# Patient Record
Sex: Female | Born: 1975
Health system: Southern US, Community
[De-identification: ages and names within clinical notes are randomized; demographics above are authoritative.]

## PROBLEM LIST (undated history)

## (undated) DIAGNOSIS — H269 Unspecified cataract: Secondary | ICD-10-CM

## (undated) DIAGNOSIS — M1990 Unspecified osteoarthritis, unspecified site: Secondary | ICD-10-CM

## (undated) DIAGNOSIS — Z5189 Encounter for other specified aftercare: Secondary | ICD-10-CM

## (undated) DIAGNOSIS — D649 Anemia, unspecified: Secondary | ICD-10-CM

## (undated) DIAGNOSIS — J45909 Unspecified asthma, uncomplicated: Secondary | ICD-10-CM

## (undated) DIAGNOSIS — R7303 Prediabetes: Secondary | ICD-10-CM

## (undated) DIAGNOSIS — E785 Hyperlipidemia, unspecified: Secondary | ICD-10-CM

## (undated) DIAGNOSIS — R0600 Dyspnea, unspecified: Secondary | ICD-10-CM

## (undated) DIAGNOSIS — Z973 Presence of spectacles and contact lenses: Secondary | ICD-10-CM

## (undated) DIAGNOSIS — Z8742 Personal history of other diseases of the female genital tract: Secondary | ICD-10-CM

## (undated) DIAGNOSIS — Z923 Personal history of irradiation: Secondary | ICD-10-CM

## (undated) DIAGNOSIS — J45990 Exercise induced bronchospasm: Secondary | ICD-10-CM

## (undated) DIAGNOSIS — T7840XA Allergy, unspecified, initial encounter: Secondary | ICD-10-CM

## (undated) HISTORY — DX: Hyperlipidemia, unspecified: E78.5

## (undated) HISTORY — DX: Allergy, unspecified, initial encounter: T78.40XA

## (undated) HISTORY — PX: MYOMECTOMY ABDOMINAL APPROACH: SUR870

## (undated) HISTORY — DX: Unspecified osteoarthritis, unspecified site: M19.90

## (undated) HISTORY — DX: Unspecified cataract: H26.9

## (undated) HISTORY — PX: OTHER SURGICAL HISTORY: SHX169

## (undated) HISTORY — DX: Encounter for other specified aftercare: Z51.89

## (undated) HISTORY — PX: WISDOM TOOTH EXTRACTION: SHX21

## (undated) MED FILL — Goserelin Acetate Implant 3.6 MG: SUBCUTANEOUS | Qty: 3.6 | Status: AC

---

## 2001-05-25 HISTORY — PX: RIGHT OOPHORECTOMY: SHX2359

## 2012-05-25 HISTORY — PX: MYOMECTOMY: SHX85

## 2012-05-25 HISTORY — PX: MYOMECTOMY ABDOMINAL APPROACH: SUR870

## 2014-08-02 ENCOUNTER — Telehealth: Payer: Self-pay | Admitting: Primary Care

## 2014-08-02 ENCOUNTER — Encounter: Payer: Self-pay | Admitting: Primary Care

## 2014-08-02 ENCOUNTER — Ambulatory Visit (INDEPENDENT_AMBULATORY_CARE_PROVIDER_SITE_OTHER): Payer: BLUE CROSS/BLUE SHIELD | Admitting: Primary Care

## 2014-08-02 VITALS — BP 118/76 | HR 79 | Temp 98.9°F | Ht 66.0 in | Wt 232.8 lb

## 2014-08-02 DIAGNOSIS — N6320 Unspecified lump in the left breast, unspecified quadrant: Secondary | ICD-10-CM

## 2014-08-02 DIAGNOSIS — R739 Hyperglycemia, unspecified: Secondary | ICD-10-CM

## 2014-08-02 DIAGNOSIS — Z720 Tobacco use: Secondary | ICD-10-CM | POA: Diagnosis not present

## 2014-08-02 DIAGNOSIS — N63 Unspecified lump in breast: Secondary | ICD-10-CM

## 2014-08-02 DIAGNOSIS — R635 Abnormal weight gain: Secondary | ICD-10-CM | POA: Diagnosis not present

## 2014-08-02 DIAGNOSIS — M2550 Pain in unspecified joint: Secondary | ICD-10-CM

## 2014-08-02 DIAGNOSIS — R7303 Prediabetes: Secondary | ICD-10-CM | POA: Insufficient documentation

## 2014-08-02 DIAGNOSIS — Z6841 Body Mass Index (BMI) 40.0 and over, adult: Secondary | ICD-10-CM

## 2014-08-02 LAB — BASIC METABOLIC PANEL
BUN: 14 mg/dL (ref 6–23)
CHLORIDE: 108 meq/L (ref 96–112)
CO2: 27 mEq/L (ref 19–32)
Calcium: 8.9 mg/dL (ref 8.4–10.5)
Creatinine, Ser: 0.81 mg/dL (ref 0.40–1.20)
GFR: 102.32 mL/min (ref >60.00)
Glucose, Bld: 92 mg/dL (ref 70–99)
POTASSIUM: 3.9 meq/L (ref 3.5–5.1)
Sodium: 138 mEq/L (ref 135–145)

## 2014-08-02 LAB — HEMOGLOBIN A1C: HEMOGLOBIN A1C: 5.9 % (ref 4.6–6.5)

## 2014-08-02 LAB — TSH: TSH: 0.87 u[IU]/mL (ref 0.35–4.50)

## 2014-08-02 NOTE — Telephone Encounter (Signed)
I forgot to mention that her Thyroid test was normal.  Thanks again!

## 2014-08-02 NOTE — Patient Instructions (Signed)
Complete lab work prior to leaving today, we will call you with your results. Continue to use ibuprofen as needed for joint pain, do not exceed 2400mg  daily. Use topical Capzasin as needed for joint pain. Please schedule a fasting physical in three months. Welcome to [facility]!

## 2014-08-02 NOTE — Assessment & Plan Note (Signed)
Suspect arthritis given family history and presentation. She uses ibuprofen or aleve for pain which helps to reduce inflammation as well. Continue to monitor. Offered to replace ibuprofen with Meloxicam, declines at this time.

## 2014-08-02 NOTE — Assessment & Plan Note (Signed)
Checks blood sugar at work and will get between 90-130 fasting. Significant weight gain over past 2 years. Will check A1C and BMET.

## 2014-08-02 NOTE — Telephone Encounter (Signed)
Please notify Ms. Sloss of her labs below.  1. A1C was 5.9 which is borderline for diabetes. Continue efforts on exercise and diet. We will recheck this in 3-6 months. 2. BMET: Kidney function and electrolytes look great.  We will do your fasting physical soon.  Thanks Vallarie Mare!!

## 2014-08-02 NOTE — Assessment & Plan Note (Signed)
Currently uses e-cigarette. Time spent discussing smoking cessation and the negative impacts it has on health. Offered to assist when she is ready to quit.

## 2014-08-02 NOTE — Progress Notes (Signed)
Pre visit review using our clinic review tool, if applicable. No additional management support is needed unless otherwise documented below in the visit note. 

## 2014-08-02 NOTE — Progress Notes (Signed)
Subjective:    Patient ID: Nichole [name], female    DOB: [birth year], 39 y.o.   MRN: [id number]  HPI  Nichole [name] is a 39 year old female who presents today to establish care. She moved here from [place] in September of 2015.  1) Borderline diabetes: She was once diagnosed by per prior PCP in [place] and was placed on Metformin. She was adherent to Metformin for one month prior to stopping due to GI side effects of nausea. She checks her blood sugar at work sporadically and will get readings from 90's to 130's fasting.   2) Obesity: BMI is 37.5. She reports an increase in weight of about 50 pounds within the past 2 years, 20 of those pounds since moving to [facility] in September. She has a busy schedule and finds little time to care for herself, and is working to cut down on her consumption of fast food.  She is currently not exercising.  3) Joint pain and swelling: She has chronic bilateral knee pain with an aching that is reduced with 800mg  iburpofen three times daily or three aleve daily. She also uses an OTC topical anti-inflammatory which helps to reduce inflammation. 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 The swelling will reduce/resolve once she elevates her legs. She does not wear compression stockings daily when working.  4) Tobacco abuse: Smoked cigarettes intermittently since age 79 with periods of cessation for 2-3 years at a time. When smoking she would smoke 5-6 cigs daily on her days off and 2 cigs when working. She switched to the vapor 5 months ago and is now currently using an e-cigarette. She has been on Chantix in the past with reports of nausea and vomiting. She is not ready to quite, but will consider.  5) Breast Mass: Mother had breast cancer with lumpectomy at age 30. Patient had mammogram 2 years ago for a mass that resulted as benign. She was to have a follow up mammogram last year but was not  able to do so. Requested old records to review mammogram report.   Review of Systems  Constitutional: Positive for unexpected weight change.       Put on 20 of the 50 pounds since September 2015  HENT: Negative for rhinorrhea.   Respiratory: Negative for shortness of breath.   Cardiovascular: Negative for chest pain.  Gastrointestinal: Negative for diarrhea and constipation.  Endocrine: Negative for cold intolerance, heat intolerance and polyuria.  Genitourinary: Negative for dysuria and frequency.  Musculoskeletal: Positive for arthralgias.       See HPI  Skin: Negative for rash.  Neurological: Negative for dizziness and headaches.  Hematological: Negative for adenopathy.  Psychiatric/Behavioral: Negative for sleep disturbance.       Denies anxiety/depression       Past Medical History  Diagnosis Date  . Diabetes mellitus without complication     borderline    History   Social History  . Marital Status: Single    Spouse Name: N/A  . Number of Children: N/A  . Years of Education: N/A   Occupational History  . Not on file.   Social History Main Topics  . Smoking status: Light Tobacco Smoker  . Smokeless tobacco: Not on file  . Alcohol Use: 0.0 oz/week    0 Standard drinks or equivalent per week     Comment: occ  . Drug Use: No  .  Sexual Activity: Not on file   Other Topics Concern  . 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   Had 87 year old son.   Moved from [place] in Sept. 2015       Past Surgical History  Procedure Laterality Date  . Abdominal hysterectomy  2003    partial oopphorectomy  . Myomectomy  2014    Family History  Problem Relation Age of Onset  . Arthritis Mother   . Breast cancer Mother 34    Lumpectomy  . Hyperlipidemia Mother   . Hypertension Mother   . Kidney cancer Mother 77    Had ablasion  . Graves' disease Mother     Had thyroid removed  .  Graves' disease Brother     Deceased    No Known Allergies  No current outpatient prescriptions on file prior to visit.   No current facility-administered medications on file prior to visit.    BP 118/76 mmHg  Pulse 79  Temp(Src) 98.9 F (37.2 C) (Oral)  Ht 5\' 6"  (1.676 m)  Wt 232 lb 12.8 oz (105.597 kg)  BMI 37.59 kg/m2  SpO2 96%  LMP 07/24/2014    Objective:   Physical Exam  Constitutional: She is oriented to person, place, and time. She appears well-developed.  Overweight  HENT:  Head: Normocephalic.  Right Ear: External ear normal.  Left Ear: External ear normal.  Mouth/Throat: Oropharynx is clear and moist.  Eyes: Conjunctivae are normal. Pupils are equal, round, and reactive to light.  Cardiovascular: Normal rate and regular rhythm.   Pulmonary/Chest: Effort normal and breath sounds normal.  Neurological: She is alert and oriented to person, place, and time.  Skin: Skin is warm and dry. No rash noted.  Psychiatric: She has a normal mood and affect.          Assessment & Plan:

## 2014-08-02 NOTE — Assessment & Plan Note (Signed)
History of breast mass 2 years ago. Mammogram reported it to be benign.  Requested old records which include prior Mammogram. Will likely need to repeat depending on recommendations from report.

## 2014-08-02 NOTE — Assessment & Plan Note (Signed)
Mostly unhealthy diet, attempting to cut out fast food, no exercise. Discussed healthy diet and exercise. 1600 calorie meal guide provided to patient. Will continue to monitor.

## 2014-08-03 ENCOUNTER — Telehealth: Payer: Self-pay | Admitting: Primary Care

## 2014-08-03 ENCOUNTER — Encounter: Payer: Self-pay | Admitting: *Deleted

## 2014-08-03 NOTE — Telephone Encounter (Signed)
Called and notified patient of Kate's comments. Patient verbalized understanding. A letter sent with lab results sent to patient.  Patient also asked is there anything you can prescribed for her to help with weight loss. Please advise.

## 2014-08-03 NOTE — Telephone Encounter (Signed)
Called and notified patient of Kate's comments. CPE is schedule for 09/03/14.

## 2014-08-03 NOTE — Telephone Encounter (Signed)
Please notify Ms. Rockhill that I am happy to discuss this at her physical, once scheduled. We will need to obtain some preliminary tests prior to initiation of this medication, so she will need to be seen.  Thank you! Allie Bossier

## 2014-08-03 NOTE — Telephone Encounter (Signed)
Called and notified patient of Kate's comments. Patient verbalized understanding.  

## 2014-08-03 NOTE — Telephone Encounter (Signed)
emmi mailed  °

## 2014-08-16 ENCOUNTER — Ambulatory Visit (INDEPENDENT_AMBULATORY_CARE_PROVIDER_SITE_OTHER): Payer: BLUE CROSS/BLUE SHIELD | Admitting: Primary Care

## 2014-08-16 ENCOUNTER — Encounter: Payer: Self-pay | Admitting: Primary Care

## 2014-08-16 VITALS — BP 126/72 | HR 74 | Temp 98.7°F | Ht 66.0 in | Wt 231.4 lb

## 2014-08-16 DIAGNOSIS — M5416 Radiculopathy, lumbar region: Secondary | ICD-10-CM | POA: Insufficient documentation

## 2014-08-16 MED ORDER — CYCLOBENZAPRINE HCL 5 MG PO TABS: 5.0000 mg | ORAL_TABLET | Freq: Two times a day (BID) | ORAL | Status: DC | PRN

## 2014-08-16 NOTE — Progress Notes (Signed)
Pre visit review using our clinic review tool, if applicable. No additional management support is needed unless otherwise documented below in the visit note. 

## 2014-08-16 NOTE — Assessment & Plan Note (Signed)
Present more to mid left buttocks, pain with left straight leg raise. Naproxen twice daily for pain, Flexeril PRN for severe pain. 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 Will give this 6 weeks of conservative therapy before considering imaging. This may be due in part to her recent weight gain. Will follow up in 2 weeks during her physical. Will consider PT if needed.

## 2014-08-16 NOTE — Progress Notes (Signed)
Subjective:    Patient ID: Nichole [name], female    DOB: 11/02/1975, 39 y.o.   MRN: [id number]  HPI  Nichole [name] is a 39 year old female who presents today with a chief complaint of left lower back pain that is located to her left mid buttocks. This pain has been dull and achy intermittently for the past several months but starting one week ago she experienced a "lightening bolt" pain that shot down through her left lower extremity to her toes. She repots feeling "pin pricks" to her toes intermittently throughout the day. She's experienced 2 episodes of this shooting pain twice in the past week. The shooting pain will occur when she's walking at work and has caused her to grasp onto a counter top. Her pain will decrease if she bends forward. She's taken inconsistent doses of motrin, aleve, and tylenol daily which have provided temproary relief. She's also recently gained weight over the past year and is working on her diet. She denies recent injury/trauma, bowel/bladder incontinence or retention, falls, weakness, or fevers.   Review of Systems  Constitutional: Negative for fever and chills.  Respiratory: Negative for shortness of breath.   Cardiovascular: Negative for chest pain.  Gastrointestinal: Negative for abdominal pain and blood in stool.  Genitourinary: Negative for urgency and frequency.  Musculoskeletal: Positive for back pain. Negative for gait problem.       Muscle tightness to left buttocks.  Neurological: Negative for dizziness, weakness and light-headedness.       Past Medical History  Diagnosis Date  . Diabetes mellitus without complication     borderline    History   Social History  . Marital Status: Single    Spouse Name: N/A  . Number of Children: N/A  . Years of Education: N/A   Occupational History  . Not on file.   Social History Main Topics  . Smoking status: Light Tobacco Smoker  . Smokeless tobacco: Not on file  . Alcohol Use: 0.0 oz/week    0  Standard drinks or equivalent per week     Comment: occ  . Drug Use: No  . Sexual Activity: Not on file   Other Topics Concern  . 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   Had 80 year old son.   Moved from [place] in Sept. 2015       Past Surgical History  Procedure Laterality Date  . Abdominal hysterectomy  2003    partial oopphorectomy  . Myomectomy  2014    Family History  Problem Relation Age of Onset  . Arthritis Mother   . Breast cancer Mother 54    Lumpectomy  . Hyperlipidemia Mother   . Hypertension Mother   . Kidney cancer Mother 28    Had ablasion  . Graves' disease Mother     Had thyroid removed  . Graves' disease Brother     Deceased    No Known Allergies  No current outpatient prescriptions on file prior to visit.   No current facility-administered medications on file prior to visit.    BP 126/72 mmHg  Pulse 74  Temp(Src) 98.7 F (37.1 C) (Oral)  Ht 5\' 6"  (1.676 m)  Wt 231 lb 6.4 oz (104.962 kg)  BMI 37.37 kg/m2  SpO2 97%  LMP 07/24/2014    Objective:   Physical Exam  Constitutional: She is oriented to  person, place, and time. She appears well-developed.  Eyes: EOM are normal. Pupils are equal, round, and reactive to light.  Cardiovascular: Normal rate, regular rhythm and intact distal pulses.   Pulmonary/Chest: Effort normal and breath sounds normal.  Musculoskeletal:       Lumbar back: She exhibits no deformity.  Pain is mostly located deeply within mid left buttocks tissue. Decreased ROM and pain to left lateral flexion and pain during left straight leg raise laying prone and sitting upright.  Neurological: She is alert and oriented to person, place, and time. She has normal reflexes. No cranial nerve deficit.  Skin: Skin is warm and dry.  Psychiatric: She has a normal mood and affect.          Assessment & Plan:

## 2014-08-16 NOTE — Patient Instructions (Addendum)
Take Naproxen 500mg  twice daily for pain. Use Flexeril as needed for severe muscle spasms/back pain. Rest, ice/heat.  We will follow up with this during your scheduled physical in 2 weeks.  Lumbosacral Radiculopathy Lumbosacral radiculopathy is a pinched nerve or nerves in the low back (lumbosacral area). When this happens you may have weakness in your legs and may not be able to stand on your toes. You may have pain going down into your legs. There may be difficulties with walking normally. There are many causes of this problem. Sometimes this may happen from an injury, or simply from arthritis or boney problems. It may also be caused by other illnesses such as diabetes. If there is no improvement after treatment, further studies may be done to find the exact cause. DIAGNOSIS  X-rays may be needed if the problems become long standing. Electromyograms may be done. This study is one in which the working of nerves and muscles is studied. HOME CARE INSTRUCTIONS   Applications of ice packs may be helpful. Ice can be used in a plastic bag with a towel around it to prevent frostbite to skin. This may be used every 2 hours for 20 to 30 minutes, or as needed, while awake, or as directed by your caregiver.  Only take over-the-counter or prescription medicines for pain, discomfort, or fever as directed by your caregiver.  If physical therapy was prescribed, follow your caregiver's directions. SEEK IMMEDIATE MEDICAL CARE IF:   You have pain not controlled with medications.  You seem to be getting worse rather than better.  You develop increasing weakness in your legs.  You develop loss of bowel or bladder control.  You have difficulty with walking or balance, or develop clumsiness in the use of your legs.  You have a fever. MAKE SURE YOU:   Understand these instructions.  Will watch your condition.  Will get help right away if you are not doing well or get worse. Document Released:  05/11/2005 Document Revised: 08/03/2011 Document Reviewed: 12/30/2007 [facility] Patient Information 2015 [place], [place]. This information is not intended to replace advice given to you by your health care provider. Make sure you discuss any questions you have with your health care provider.

## 2014-09-03 ENCOUNTER — Encounter: Payer: Self-pay | Admitting: Primary Care

## 2014-09-03 ENCOUNTER — Ambulatory Visit (INDEPENDENT_AMBULATORY_CARE_PROVIDER_SITE_OTHER): Payer: BLUE CROSS/BLUE SHIELD | Admitting: Primary Care

## 2014-09-03 VITALS — BP 126/78 | HR 77 | Temp 98.1°F | Ht 66.0 in | Wt 234.4 lb

## 2014-09-03 DIAGNOSIS — N920 Excessive and frequent menstruation with regular cycle: Secondary | ICD-10-CM | POA: Insufficient documentation

## 2014-09-03 DIAGNOSIS — N63 Unspecified lump in breast: Secondary | ICD-10-CM

## 2014-09-03 DIAGNOSIS — E669 Obesity, unspecified: Secondary | ICD-10-CM

## 2014-09-03 DIAGNOSIS — N939 Abnormal uterine and vaginal bleeding, unspecified: Secondary | ICD-10-CM | POA: Diagnosis not present

## 2014-09-03 DIAGNOSIS — Z9889 Other specified postprocedural states: Secondary | ICD-10-CM

## 2014-09-03 DIAGNOSIS — M5416 Radiculopathy, lumbar region: Secondary | ICD-10-CM | POA: Diagnosis not present

## 2014-09-03 DIAGNOSIS — N6320 Unspecified lump in the left breast, unspecified quadrant: Secondary | ICD-10-CM

## 2014-09-03 LAB — CBC WITH DIFFERENTIAL/PLATELET
BASOS PCT: 0.3 % (ref 0.0–3.0)
Basophils Absolute: 0 10*3/uL (ref 0.0–0.1)
EOS ABS: 0.1 10*3/uL (ref 0.0–0.7)
Eosinophils Relative: 1.8 % (ref 0.0–5.0)
HCT: 31.3 % — ABNORMAL LOW (ref 36.0–46.0)
Hemoglobin: 10.3 g/dL — ABNORMAL LOW (ref 12.0–15.0)
Lymphocytes Relative: 46.6 % — ABNORMAL HIGH (ref 12.0–46.0)
Lymphs Abs: 1.7 10*3/uL (ref 0.7–4.0)
MCHC: 32.8 g/dL (ref 30.0–36.0)
MCV: 88.7 fl (ref 78.0–100.0)
MONO ABS: 0.3 10*3/uL (ref 0.1–1.0)
Monocytes Relative: 7.4 % (ref 3.0–12.0)
Neutro Abs: 1.6 10*3/uL (ref 1.4–7.7)
Neutrophils Relative %: 43.9 % (ref 43.0–77.0)
Platelets: 229 10*3/uL (ref 150.0–400.0)
RBC: 3.53 Mil/uL — ABNORMAL LOW (ref 3.87–5.11)
RDW: 14.4 % (ref 11.5–15.5)
WBC: 3.6 10*3/uL — ABNORMAL LOW (ref 4.0–10.5)

## 2014-09-03 LAB — HEPATIC FUNCTION PANEL
ALBUMIN: 3.5 g/dL (ref 3.5–5.2)
ALT: 10 U/L (ref 0–35)
AST: 13 U/L (ref 0–37)
Alkaline Phosphatase: 63 U/L (ref 39–117)
Bilirubin, Direct: 0.1 mg/dL (ref 0.0–0.3)
Total Bilirubin: 0.2 mg/dL (ref 0.2–1.2)
Total Protein: 7 g/dL (ref 6.0–8.3)

## 2014-09-03 MED ORDER — PHENTERMINE HCL 15 MG PO CAPS: 15.0000 mg | ORAL_CAPSULE | ORAL | Status: DC

## 2014-09-03 NOTE — Assessment & Plan Note (Signed)
Old records recommended that she follow up in 2015 which she did not. Ordered bilateral diagnostic mammogram with ultrasound to left breast. Breast exam completed today and was unremarkable.

## 2014-09-03 NOTE — Assessment & Plan Note (Signed)
Heavy periods for years. History of Myomectomy in 2014 and continues to have bleeding since. It was recommended by GYN to have an abdominal hysterectomy for which she currently reguses. Also refuses to follow up with GYN and will "think about it". Will obtain CBC today to evaluate extent of bleeding.

## 2014-09-03 NOTE — Progress Notes (Signed)
Pre visit review using our clinic review tool, if applicable. No additional management support is needed unless otherwise documented below in the visit note. 

## 2014-09-03 NOTE — Assessment & Plan Note (Signed)
Continued weight gain despite efforts to exercise and improve diet over past month. Will start Phentermine per patient's request to assist with weight loss. Baseline ECG, blood work, and BP obtained which are all stable. She was educated on the side effects of this medication, and that she must incorporate a healthy diet and continued exercise to be successful with this medication. She verbalized understanding. She was also educated to use birth control while on this medication. Will follow up in 4 weeks.

## 2014-09-03 NOTE — Progress Notes (Signed)
Subjective:    Patient ID: Nichole [name], female    DOB: 14-Nov-1975, 39 y.o.   MRN: [id number]  HPI  Nichole [name] is a 39 year old female who presents today who presents today for complete physical.  Immunizations: -Tetanus: Last tetanus was 8 years ago. -Influenza: Had last season   Diet: She prepares home made meals, and is starting to scale down on the quantity of her meals. She will typically have soup and sandwich with fruit for lunch and or dinner, but will occasionally fix a full course meal that may include  baked ham, potato salad, cornbread, rice.   Exercise: Has been doing more outdoor activities with her daughter such as playing in the park and walking. She attempts to walk around her community for about 20 minutes, three-four days a week.  Pap Smear: Last pap was 2 years ago.   Mammogram: Had left breast mass 2 years ago that was determined to be benign. It was recommended at that time that she come back in 2015 for follow up which she did not do so.  1) Back pain: Feels better after using ibuprofen daily and flexeril as needed.   2) Obesity: She's been struggling with steady weight gain for years and would like to go on weight loss medications. She's tried dieting and exercising in the past without a reduction in weight. She was seen one month ago and was encouraged to work very hard on weight loss efforts. She endorses working hard to lose weight over the past month but has been unsuccessful. She denies chest pain, shortness of breath, headaches, palpitations.  Educated on proper birth control.  Wt Readings from Last 3 Encounters:  09/03/14 234 lb 6.4 oz (106.323 kg)  08/16/14 231 lb 6.4 oz (104.962 kg)  08/02/14 232 lb 12.8 oz (105.597 kg)   Body mass index is 37.85 kg/(m^2).  BP Readings from Last 3 Encounters:  09/03/14 126/78  08/16/14 126/72  08/02/14 118/76     3) Vaginal bleeding: She's experienced heavy periods for years and had a myomectomy in  December 2014. Her heavy vaginal bleeding had reduced for several months after the procedure but the resumed. It was recommended to her by GYN that she have a complete hysterectomy after learning that she had continued bleeding. She refused the hysterectomy at the time and currently does not want to have one. She does not want to see GYN and states that her heavy bleeding is tolerable. She will wear a tampon and pad together and will typically go through them every 1-2 hours.   Review of Systems  HENT: Negative for rhinorrhea.   Respiratory: Negative for cough and shortness of breath.   Cardiovascular: Negative for chest pain.  Gastrointestinal: Negative for abdominal pain, diarrhea and constipation.  Genitourinary: Negative for dysuria and frequency.  Musculoskeletal: Negative for myalgias.       Back pain is improving.  Skin: Negative for rash.  Neurological: Negative for dizziness and headaches.  Psychiatric/Behavioral:       Denies concerns for anxiety or depression       Past Medical History  Diagnosis Date  . Diabetes mellitus without complication     borderline    History   Social History  . Marital Status: Single    Spouse Name: N/A  . Number of Children: N/A  . Years of Education: N/A   Occupational History  . Not on file.   Social History Main Topics  . Smoking status: Light Tobacco  Smoker  . Smokeless tobacco: Not on file  . Alcohol Use: 0.0 oz/week    0 Standard drinks or equivalent per week     Comment: rarely  . Drug Use: No  . Sexual Activity: Not on file   Other Topics Concern  . 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   Moved from [place] in Sept. 2015       Past Surgical History  Procedure Laterality Date  . Abdominal hysterectomy  2003    partial oopphorectomy  . Myomectomy  2014    Family History  Problem Relation Age of Onset  .  Arthritis Mother   . Breast cancer Mother 50    Lumpectomy  . Hyperlipidemia Mother   . Hypertension Mother   . Kidney cancer Mother 40    Had ablasion  . Graves' disease Mother     Had thyroid removed  . Graves' disease Brother     Deceased    No Known Allergies  No current outpatient prescriptions on file prior to visit.   No current facility-administered medications on file prior to visit.    BP 126/78 mmHg  Pulse 77  Temp(Src) 98.1 F (36.7 C) (Oral)  Ht 5\' 6"  (1.676 m)  Wt 234 lb 6.4 oz (106.323 kg)  BMI 37.85 kg/m2  SpO2 97%  LMP 08/06/2014    Objective:   Physical Exam  Constitutional: She is oriented to person, place, and time. She appears well-developed.  HENT:  Head: Normocephalic.  Right Ear: External ear normal.  Left Ear: External ear normal.  Nose: Nose normal.  Mouth/Throat: Oropharynx is clear and moist.  Eyes: EOM are normal. Pupils are equal, round, and reactive to light.  Neck: Neck supple. No thyromegaly present.  Cardiovascular: Normal rate and regular rhythm.   Pulmonary/Chest: Effort normal and breath sounds normal. Right breast exhibits no mass, no skin change and no tenderness. Left breast exhibits no mass, no skin change and no tenderness.  Abdominal: Soft. Bowel sounds are normal. She exhibits no mass. There is no tenderness.  Lymphadenopathy:    She has no cervical adenopathy.  Neurological: She is alert and oriented to person, place, and time. She has normal reflexes. No cranial nerve deficit. Coordination normal.  Skin: Skin is warm and dry.  Psychiatric: She has a normal mood and affect.          Assessment & Plan:

## 2014-09-03 NOTE — Assessment & Plan Note (Signed)
Improved on ibuprofen and PRN flexeril. Will continue to monitor.

## 2014-09-03 NOTE — Patient Instructions (Addendum)
Complete lab work prior to leaving today. I will notify you of your results. You will be contacted regarding your Mammogram. Start Phentermine capsules. One capsule by mouth every morning.  It's important to continue your weight loss efforts in order for this medication to work. I strongly encourage you to follow up with GYN for the vaginal bleeding. Follow up in 4 weeks for re-evaluation of weight loss.  Be aware of side effects of this medication including headaches, dizziness, fast heart rate, elevated blood pressure.

## 2014-09-11 ENCOUNTER — Telehealth: Payer: Self-pay | Admitting: Primary Care

## 2014-09-11 NOTE — Telephone Encounter (Signed)
Spoke to pt. I need her to sign another release in order to get previous MM records. Faxed her another release to fax back.

## 2014-09-11 NOTE — Telephone Encounter (Signed)
Called 09/04/14 and LVM re: MM and [place].  Called 09/11/14--unable to leave vm due to mailbox being full

## 2014-10-03 ENCOUNTER — Ambulatory Visit (INDEPENDENT_AMBULATORY_CARE_PROVIDER_SITE_OTHER): Payer: BLUE CROSS/BLUE SHIELD | Admitting: Primary Care

## 2014-10-03 ENCOUNTER — Encounter: Payer: Self-pay | Admitting: Primary Care

## 2014-10-03 VITALS — BP 116/70 | HR 82 | Temp 98.3°F | Ht 66.0 in | Wt 223.4 lb

## 2014-10-03 DIAGNOSIS — E669 Obesity, unspecified: Secondary | ICD-10-CM

## 2014-10-03 MED ORDER — PHENTERMINE HCL 15 MG PO CAPS: 15.0000 mg | ORAL_CAPSULE | ORAL | Status: DC

## 2014-10-03 NOTE — Assessment & Plan Note (Signed)
Weight loss of 11 pounds since last visit. Currently taking Phentermine and improving her diet. Challenged her to start exercising. Denies chest pain, palpitations, dizziness, diarrhea, headaches. Refill provided, follow up in one month.

## 2014-10-03 NOTE — Patient Instructions (Signed)
Congratulations on your weight loss! Continue your efforts to improve your diet. You should try to incorporate some exercise when possible. Follow up in 4 weeks for re-evaluation.

## 2014-10-03 NOTE — Progress Notes (Signed)
Subjective:    Patient ID: Nichole [name], female    DOB: 1976-03-02, 39 y.o.   MRN: [id number]  HPI  Nichole [name] is a 39 year old female who presents today for follow up of weight loss efforts.  She was placed on Phentermine on 09/03/14 due to lack of weight loss despite efforts of physical exercise and improvement of her diet. She denies chest pain, shortness of breath, palpitations, headaches. She's continually working to improve her diet by incorporating fresh fruits and vegetables, decreasing fast food consumption, eliminating sodas and drinking diet green tea and water, substituting sugar free coffee sweetener for her coffee, etc. She will typically eat the heaviest meal of the day in the morning and fruit and vegetables for dinner. She initially noticed some decrease in appetite with this medication, but now will have to mentally convince herself that she's not hungry. This has worked to reduce her snacking.  She is active at work, but does not exercise regularly.   Wt Readings from Last 3 Encounters:  10/03/14 223 lb 6.4 oz (101.334 kg)  09/03/14 234 lb 6.4 oz (106.323 kg)  08/16/14 231 lb 6.4 oz (104.962 kg)     Review of Systems  Constitutional: Negative for fatigue.  Respiratory: Negative for shortness of breath.   Cardiovascular: Negative for chest pain and palpitations.  Gastrointestinal: Negative for abdominal pain.  Neurological: Negative for dizziness and headaches.       Past Medical History  Diagnosis Date  . Diabetes mellitus without complication     borderline    History   Social History  . Marital Status: Single    Spouse Name: N/A  . Number of Children: N/A  . Years of Education: N/A   Occupational History  . Not on file.   Social History Main Topics  . Smoking status: Light Tobacco Smoker  . Smokeless tobacco: Not on file  . Alcohol Use: 0.0 oz/week    0 Standard drinks or equivalent per week     Comment: rarely  . Drug Use: No  . Sexual  Activity: Not on file   Other Topics Concern  . 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   Had 59 year old son.   Moved from [place] in Sept. 2015       Past Surgical History  Procedure Laterality Date  . Abdominal hysterectomy  2003    partial oopphorectomy  . Myomectomy  2014    Family History  Problem Relation Age of Onset  . Arthritis Mother   . Breast cancer Mother 86    Lumpectomy  . Hyperlipidemia Mother   . Hypertension Mother   . Kidney cancer Mother 15    Had ablasion  . Graves' disease Mother     Had thyroid removed  . Graves' disease Brother     Deceased    No Known Allergies  No current outpatient prescriptions on file prior to visit.   No current facility-administered medications on file prior to visit.    BP 116/70 mmHg  Pulse 82  Temp(Src) 98.3 F (36.8 C) (Oral)  Ht 5\' 6"  (1.676 m)  Wt 223 lb 6.4 oz (101.334 kg)  BMI 36.08 kg/m2  SpO2 96%  LMP 09/20/2014    Objective:   Physical Exam  Constitutional: She is oriented to person, place, and time.  Cardiovascular: Normal rate, regular rhythm, normal heart  sounds and intact distal pulses.   No murmur heard. Pulmonary/Chest: Effort normal and breath sounds normal.  Neurological: She is alert and oriented to person, place, and time.  Skin: Skin is warm and dry.          Assessment & Plan:

## 2014-10-03 NOTE — Progress Notes (Signed)
Pre visit review using our clinic review tool, if applicable. No additional management support is needed unless otherwise documented below in the visit note. 

## 2014-10-04 ENCOUNTER — Ambulatory Visit: Payer: BLUE CROSS/BLUE SHIELD | Admitting: Primary Care

## 2014-10-05 NOTE — Telephone Encounter (Signed)
10/05/14-Re-faxed release to [facility] to get any biopsy reports

## 2014-10-23 NOTE — Telephone Encounter (Signed)
10/23/14-all mm and [place] reports scanned in chart. LVM on pt cell to call back so we can schedule

## 2014-10-31 ENCOUNTER — Ambulatory Visit (INDEPENDENT_AMBULATORY_CARE_PROVIDER_SITE_OTHER): Payer: BLUE CROSS/BLUE SHIELD | Admitting: Primary Care

## 2014-10-31 ENCOUNTER — Encounter: Payer: Self-pay | Admitting: Primary Care

## 2014-10-31 VITALS — BP 116/74 | HR 77 | Temp 98.2°F | Ht 66.0 in | Wt 217.8 lb

## 2014-10-31 DIAGNOSIS — E669 Obesity, unspecified: Secondary | ICD-10-CM

## 2014-10-31 DIAGNOSIS — D649 Anemia, unspecified: Secondary | ICD-10-CM | POA: Diagnosis not present

## 2014-10-31 LAB — CBC
HCT: 34.6 % — ABNORMAL LOW (ref 36.0–46.0)
Hemoglobin: 11.4 g/dL — ABNORMAL LOW (ref 12.0–15.0)
MCHC: 32.8 g/dL (ref 30.0–36.0)
MCV: 89.6 fl (ref 78.0–100.0)
Platelets: 254 10*3/uL (ref 150.0–400.0)
RBC: 3.86 Mil/uL — ABNORMAL LOW (ref 3.87–5.11)
RDW: 13.7 % (ref 11.5–15.5)
WBC: 3.7 10*3/uL — ABNORMAL LOW (ref 4.0–10.5)

## 2014-10-31 MED ORDER — PHENTERMINE HCL 15 MG PO CAPS: 15.0000 mg | ORAL_CAPSULE | ORAL | Status: DC

## 2014-10-31 NOTE — Assessment & Plan Note (Signed)
Total loss of 17 pounds after second month on Phentermine. Refills provided for Phentermine. Denies palpitations, chest pain, SOB. Follow up in one month.

## 2014-10-31 NOTE — Progress Notes (Signed)
Pre visit review using our clinic review tool, if applicable. No additional management support is needed unless otherwise documented below in the visit note. 

## 2014-10-31 NOTE — Patient Instructions (Signed)
Try alternating ibuprofen and tylenol for knee pain. Continue Phentermine.  Continue your efforts to reduce carbohydrates and sugary foods. You should be consuming lean meats, vegetables, fruits, and drinking plenty of water daily (about 2.2 liters). You also need at 30-45 minutes of moderate intensity exercise 4-5 days weekly. You look great! Good to see you!

## 2014-10-31 NOTE — Progress Notes (Signed)
   Subjective:    Patient ID: Nichole [name], female    DOB: [birth year], 39 y.o.   MRN: [id number]  HPI  1) Obesity: She is currently managed on Phentermine 15 mg tablets and is completing her second month. Since beginning the medication she's lost a total of 17 pounds. Denies chest pain, headaches, dizziness.  Her diet consists of mostly of tuna and crackers, boiled eggs, [place] bacon and eggs with occasional grits for breakfast; healthy choice soups, peanut butter and jelly sandwiches and frozen dinners for lunch; lean meats and vegetables for dinner.  She tries to exercise twice weekly at the gym, and is trying to stay active with her young son.   Wt Readings from Last 3 Encounters:  10/31/14 217 lb 12.8 oz (98.793 kg)  10/03/14 223 lb 6.4 oz (101.334 kg)  09/03/14 234 lb 6.4 oz (106.323 kg)   2) Knee pain: Present to both knees for several years intermittently. 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 She's been taking iburpofen with some relief.  Review of Systems  Respiratory: Negative for shortness of breath.   Cardiovascular: Negative for chest pain and palpitations.  Neurological: Negative for dizziness and weakness.       Past Medical History  Diagnosis Date  . Diabetes mellitus without complication     borderline    History   Social History  . Marital Status: Single    Spouse Name: N/A  . Number of Children: N/A  . Years of Education: N/A   Occupational History  . Not on file.   Social History Main Topics  . Smoking status: Light Tobacco Smoker  . Smokeless tobacco: Not on file  . Alcohol Use: 0.0 oz/week    0 Standard drinks or equivalent per week     Comment: rarely  . Drug Use: No  . Sexual Activity: Not on file   Other Topics Concern  . 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   Had 60 year old son.   Moved from [place]  in Sept. 2015       Past Surgical History  Procedure Laterality Date  . Abdominal hysterectomy  2003    partial oopphorectomy  . Myomectomy  2014    Family History  Problem Relation Age of Onset  . Arthritis Mother   . Breast cancer Mother 90    Lumpectomy  . Hyperlipidemia Mother   . Hypertension Mother   . Kidney cancer Mother 38    Had ablasion  . Graves' disease Mother     Had thyroid removed  . Graves' disease Brother     Deceased    No Known Allergies  No current outpatient prescriptions on file prior to visit.   No current facility-administered medications on file prior to visit.    BP 116/74 mmHg  Pulse 77  Temp(Src) 98.2 F (36.8 C) (Oral)  Ht 5\' 6"  (1.676 m)  Wt 217 lb 12.8 oz (98.793 kg)  BMI 35.17 kg/m2  SpO2 95%  LMP 10/07/2014    Objective:   Physical Exam  Constitutional: She appears well-nourished.  Cardiovascular: Normal rate and regular rhythm.   Pulmonary/Chest: Effort normal and breath sounds normal.  Skin: Skin is warm and dry.          Assessment & Plan:

## 2014-12-04 ENCOUNTER — Ambulatory Visit: Payer: Self-pay | Admitting: Primary Care

## 2015-05-09 ENCOUNTER — Telehealth: Payer: Self-pay | Admitting: Primary Care

## 2015-05-09 DIAGNOSIS — Z13220 Encounter for screening for lipoid disorders: Secondary | ICD-10-CM

## 2015-05-09 NOTE — Telephone Encounter (Signed)
Tried to call patient but voice mail box is full. 

## 2015-05-09 NOTE — Telephone Encounter (Signed)
Pt made appointment 12/21

## 2015-05-09 NOTE — Telephone Encounter (Signed)
I receieved paperwork from [facility] for a biometric screening; however, Nichole [name] does not have a cholesterol panel on file. She will need to schedule a fasting lab appointment for this. I've placed the orders. Thanks.

## 2015-05-15 ENCOUNTER — Other Ambulatory Visit (INDEPENDENT_AMBULATORY_CARE_PROVIDER_SITE_OTHER): Payer: Managed Care, Other (non HMO)

## 2015-05-15 DIAGNOSIS — Z13220 Encounter for screening for lipoid disorders: Secondary | ICD-10-CM

## 2015-05-15 LAB — LIPID PANEL
CHOL/HDL RATIO: 4
Cholesterol: 147 mg/dL (ref 0–200)
HDL: 36 mg/dL — AB (ref >39.00)
LDL Cholesterol: 102 mg/dL — ABNORMAL HIGH (ref 0–99)
NonHDL: 110.89
Triglycerides: 43 mg/dL (ref 0.0–149.0)
VLDL: 8.6 mg/dL (ref 0.0–40.0)

## 2016-07-16 ENCOUNTER — Encounter: Payer: Self-pay | Admitting: Primary Care

## 2016-07-16 ENCOUNTER — Other Ambulatory Visit: Payer: Self-pay | Admitting: Primary Care

## 2016-07-16 DIAGNOSIS — N921 Excessive and frequent menstruation with irregular cycle: Secondary | ICD-10-CM

## 2016-07-20 ENCOUNTER — Ambulatory Visit: Payer: Managed Care, Other (non HMO) | Admitting: Primary Care

## 2016-08-04 ENCOUNTER — Encounter: Payer: Self-pay | Admitting: *Deleted

## 2016-08-04 ENCOUNTER — Encounter: Payer: Self-pay | Admitting: Family Medicine

## 2016-08-04 ENCOUNTER — Ambulatory Visit (INDEPENDENT_AMBULATORY_CARE_PROVIDER_SITE_OTHER): Payer: Managed Care, Other (non HMO) | Admitting: Family Medicine

## 2016-08-04 VITALS — BP 111/71 | HR 76 | Resp 18 | Ht 65.5 in | Wt 224.0 lb

## 2016-08-04 DIAGNOSIS — N921 Excessive and frequent menstruation with irregular cycle: Secondary | ICD-10-CM

## 2016-08-04 DIAGNOSIS — Z3202 Encounter for pregnancy test, result negative: Secondary | ICD-10-CM | POA: Diagnosis not present

## 2016-08-04 DIAGNOSIS — Z1151 Encounter for screening for human papillomavirus (HPV): Secondary | ICD-10-CM | POA: Diagnosis not present

## 2016-08-04 DIAGNOSIS — D259 Leiomyoma of uterus, unspecified: Secondary | ICD-10-CM | POA: Insufficient documentation

## 2016-08-04 DIAGNOSIS — D251 Intramural leiomyoma of uterus: Secondary | ICD-10-CM

## 2016-08-04 DIAGNOSIS — Z124 Encounter for screening for malignant neoplasm of cervix: Secondary | ICD-10-CM

## 2016-08-04 DIAGNOSIS — D250 Submucous leiomyoma of uterus: Secondary | ICD-10-CM

## 2016-08-04 LAB — POCT URINE PREGNANCY: PREG TEST UR: NEGATIVE

## 2016-08-04 NOTE — Progress Notes (Signed)
Subjective:    Patient ID: Nichole [name] is a 41 y.o. female presenting with Heavy/ Irregular Menstrual Cycles  on 08/04/2016  HPI: Has heavy cycles. Notes heavy bleeding, lasts x 2 wks. Notes 2 cycles in December. Some irregularity with 1 cycle/month. Stains her clothes and does not feel like she can leave the house. Had myomectomy 5 years ago. Has h/o 2 prior ovarian cysts with removal on right. Still trying to have children at that time. Initially had improvement in her cycles, but they have worsened again. States she has completed child bearing now. Initial surgery done in [facility]. (Dr. Hoyle Barr) Has vertical skin incision.  Review of Systems  Constitutional: Negative for chills and fever.  Respiratory: Negative for shortness of breath.   Cardiovascular: Negative for chest pain.  Gastrointestinal: Positive for abdominal pain. Negative for nausea and vomiting.  Genitourinary: Positive for menstrual problem, pelvic pain and vaginal bleeding. Negative for dysuria.  Skin: Negative for rash.      Objective:    BP 111/71 (BP Location: Left Arm, Patient Position: Sitting, Cuff Size: Large)   Pulse 76   Resp 18   Ht 5' 5.5" (1.664 m)   Wt 224 lb (101.6 kg)   LMP 06/29/2016   BMI 36.71 kg/m  Physical Exam  Constitutional: She is oriented to person, place, and time. She appears well-developed and well-nourished. No distress.  HENT:  Head: Normocephalic and atraumatic.  Eyes: No scleral icterus.  Neck: Neck supple.  Cardiovascular: Normal rate.   Pulmonary/Chest: Effort normal.  Abdominal: Soft.  Genitourinary:  Genitourinary Comments: BUS normal, vagina is pink and rugated, cervix is nulliparous without lesion, uterus is firm, enlarged, mass fill posterior cul-de-sac, approximately 12-14 wk size retroverted, no adnexal mass or tenderness.   Neurological: She is alert and oriented to person, place, and time.  Skin: Skin is warm and dry.  Psychiatric: She has a normal mood and  affect.   Procedure: Patient given informed consent, signed copy in the chart, time out was performed. Appropriate time out taken. The patient was placed in the lithotomy position and the cervix brought into view with sterile speculum.  Portio of cervix cleansed x 2 with betadine swabs.  A tenaculum was placed in the anterior lip of the cervix.  The uterus was sounded for depth of 10 cm. A pipelle was introduced to into the uterus, suction created,  and an endometrial sample was obtained. All equipment was removed and accounted for.  The patient tolerated the procedure well.      Assessment & Plan:   Problem List Items Addressed This Visit      Unprioritized   Menorrhagia - Primary    Likely secondary to fibroids--check pelvic sono, EMB, pap, CBC, TSH.      Relevant Orders   US Pelvis Complete   US Transvaginal Non-OB   Surgical pathology   TSH   CBC   POCT urine pregnancy (Completed)   Fibroid uterus    Patient with prior myomectomy and enlarged uterus on exam. Will check u/s and decide on best course of action.       Other Visit Diagnoses    Screening for cervical cancer       Relevant Orders   Cytology - PAP      Total face-to-face time with patient: 30 minutes. Over 50% of encounter was spent on counseling and coordination of care. Return in about 4 weeks (around 09/01/2016) for a follow-up.  Nichole [name] 08/04/2016 2:33 PM

## 2016-08-04 NOTE — Assessment & Plan Note (Addendum)
Likely secondary to fibroids--check pelvic sono, EMB, pap, CBC, TSH.

## 2016-08-04 NOTE — Patient Instructions (Signed)

## 2016-08-04 NOTE — Assessment & Plan Note (Signed)
Patient with prior myomectomy and enlarged uterus on exam. Will check u/s and decide on best course of action.

## 2016-08-05 LAB — CBC
HEMATOCRIT: 29.4 % — AB (ref 34.0–46.6)
HEMOGLOBIN: 8.9 g/dL — AB (ref 11.1–15.9)
MCH: 24.5 pg — AB (ref 26.6–33.0)
MCHC: 30.3 g/dL — AB (ref 31.5–35.7)
MCV: 81 fL (ref 79–97)
Platelets: 372 10*3/uL (ref 150–379)
RBC: 3.63 x10E6/uL — ABNORMAL LOW (ref 3.77–5.28)
RDW: 16.1 % — ABNORMAL HIGH (ref 12.3–15.4)
WBC: 4.6 10*3/uL (ref 3.4–10.8)

## 2016-08-05 LAB — TSH: TSH: 0.953 u[IU]/mL (ref 0.450–4.500)

## 2016-08-08 LAB — CYTOLOGY - PAP
Diagnosis: NEGATIVE
HPV (WINDOPATH): NOT DETECTED

## 2016-08-13 ENCOUNTER — Ambulatory Visit (HOSPITAL_COMMUNITY)
Admission: RE | Admit: 2016-08-13 | Discharge: 2016-08-13 | Disposition: A | Discharge status: Home / Self Care | Payer: Managed Care, Other (non HMO) | Source: Ambulatory Visit | Attending: Family Medicine | Admitting: Family Medicine

## 2016-08-13 DIAGNOSIS — N852 Hypertrophy of uterus: Secondary | ICD-10-CM | POA: Diagnosis not present

## 2016-08-13 DIAGNOSIS — N921 Excessive and frequent menstruation with irregular cycle: Secondary | ICD-10-CM | POA: Diagnosis not present

## 2016-08-13 DIAGNOSIS — D250 Submucous leiomyoma of uterus: Secondary | ICD-10-CM | POA: Insufficient documentation

## 2016-08-13 DIAGNOSIS — D252 Subserosal leiomyoma of uterus: Secondary | ICD-10-CM | POA: Diagnosis not present

## 2016-09-01 ENCOUNTER — Encounter (HOSPITAL_COMMUNITY): Payer: Self-pay

## 2016-09-01 ENCOUNTER — Ambulatory Visit (INDEPENDENT_AMBULATORY_CARE_PROVIDER_SITE_OTHER): Payer: Managed Care, Other (non HMO) | Admitting: Family Medicine

## 2016-09-01 ENCOUNTER — Encounter: Payer: Self-pay | Admitting: Family Medicine

## 2016-09-01 VITALS — BP 116/75 | HR 98 | Ht 65.0 in | Wt 223.0 lb

## 2016-09-01 DIAGNOSIS — N921 Excessive and frequent menstruation with irregular cycle: Secondary | ICD-10-CM

## 2016-09-01 DIAGNOSIS — D251 Intramural leiomyoma of uterus: Secondary | ICD-10-CM

## 2016-09-01 DIAGNOSIS — D250 Submucous leiomyoma of uterus: Secondary | ICD-10-CM

## 2016-09-01 MED ORDER — MEGESTROL ACETATE 40 MG PO TABS: 40.0000 mg | ORAL_TABLET | Freq: Two times a day (BID) | ORAL | 3 refills | Status: DC

## 2016-09-01 NOTE — Progress Notes (Signed)
   Subjective:    Patient ID: Nichole [name] is a 41 y.o. female presenting with Follow-up  on 09/01/2016  HPI: Here today for f/u. Previously seen with abnormal bleeding. Still having heavy cycles. EMB was WNL--u/s shows 4.5 cm submucosal fibroid and possible polyp.   Review of Systems  Constitutional: Negative for chills and fever.  Respiratory: Negative for shortness of breath.   Cardiovascular: Negative for chest pain.  Gastrointestinal: Negative for abdominal pain, nausea and vomiting.  Genitourinary: Negative for dysuria.  Skin: Negative for rash.      Objective:    BP 116/75   Pulse 98   Ht 5\' 5"  (1.651 m)   Wt 223 lb (101.2 kg)   LMP 08/13/2016   BMI 37.11 kg/m  Physical Exam  Constitutional: She is oriented to person, place, and time. She appears well-developed and well-nourished. No distress.  HENT:  Head: Normocephalic and atraumatic.  Eyes: No scleral icterus.  Neck: Neck supple.  Cardiovascular: Normal rate.   Pulmonary/Chest: Effort normal.  Abdominal: Soft.  Neurological: She is alert and oriented to person, place, and time.  Skin: Skin is warm and dry.  Psychiatric: She has a normal mood and affect.      CBC Latest Ref Rng & Units 08/04/2016 10/31/2014 09/03/2014  WBC 3.4 - 10.8 x10E3/uL 4.6 3.7(L) 3.6(L)  Hemoglobin 12.0 - 15.0 g/dL - 11.4(L) 10.3(L)  Hematocrit 34.0 - 46.6 % 29.4(L) 34.6(L) 31.3(L)  Platelets 150 - 379 x10E3/uL 372 254.0 229.0   U/s Enlarged fibroid uterus. There is a large central submucosal fibroid measuring up to 4.3 cm. Separate hypoechoic area noted within the endometrium measured up to 1.7 cm could reflect a small submucosal/pedunculated fibroid or endometrial polyp  Assessment & Plan:   Problem List Items Addressed This Visit      Unprioritized   Menorrhagia    Trial of megace to control cycles. Hysterectomy discussed at length, including alternative treatment with Aygestin or IUD, or OC's, UFE Risks include but are not  limited to bleeding, infection, injury to surrounding structures, including bowel, bladder and ureters, blood clots, and death.  Likelihood of success is high.  Discussed LAVH is most likely way to perform and how long pt. Would be out of work. Risks of prior myomectomy discussed in detail as well as 2 prior vertical skin incisions. Risks of needing to convert to open, bowel injury, possible colostomy discussed. She strongly desires definitive treatment with Hysterectomy.       Relevant Medications   megestrol (MEGACE) 40 MG tablet   Fibroid uterus - Primary    Given fibroids, which are recurrent patient elects for hysterectomy         Total face-to-face time with patient: 25 minutes. Over 50% of encounter was spent on counseling and coordination of care.  Nichole [name] 09/01/2016 1:36 PM

## 2016-09-01 NOTE — Patient Instructions (Signed)

## 2016-09-01 NOTE — Assessment & Plan Note (Signed)
Trial of megace to control cycles. Hysterectomy discussed at length, including alternative treatment with Aygestin or IUD, or OC's, UFE Risks include but are not limited to bleeding, infection, injury to surrounding structures, including bowel, bladder and ureters, blood clots, and death.  Likelihood of success is high.  Discussed LAVH is most likely way to perform and how long pt. Would be out of work. Risks of prior myomectomy discussed in detail as well as 2 prior vertical skin incisions. Risks of needing to convert to open, bowel injury, possible colostomy discussed. She strongly desires definitive treatment with Hysterectomy.

## 2016-09-01 NOTE — Progress Notes (Signed)
Pt states cycle she had on 08/13/16 was extremely heavy.   Follow up US:  IMPRESSION: Enlarged fibroid uterus. There is a large central submucosal fibroid measuring up to 4.3 cm. Separate hypoechoic area noted within the endometrium measured up to 1.7 cm could reflect a small submucosal/ pedunculated fibroid or endometrial polyp.  Follow up EMB: Endometrium, biopsy - SECRETORY ENDOMETRIUM WITH BREAKDOWN. - NO HYPERPLASIA, ATYPIA OR MALIGNANCY IDENTIFIED.

## 2016-09-01 NOTE — Assessment & Plan Note (Signed)
Given fibroids, which are recurrent patient elects for hysterectomy

## 2016-09-22 ENCOUNTER — Telehealth: Payer: Self-pay | Admitting: *Deleted

## 2016-09-22 NOTE — Telephone Encounter (Signed)
Pt called the office c/o bleeding which was initially heavy and is now moderate for 15 days, has a history of uterine fibroids and is scheduled for a LAVH on 10-08-16.  Is currently taking Megace 40mg  twice daily.  Spoke to Dr Harolyn Rutherford about pt case, instructed for pt to increase Megace to 80mg  twice daily until surgery.  Also instructed pt to take an iron supplement daily and if the bleeding continued over the next few days or she experienced dizziness and fatigue with activity to go to MAU for evaluation.  Pt acknowledged instructions.

## 2016-09-24 NOTE — H&P (Addendum)
Nichole [name] is an 41 y.o. G26P1101 female.   Chief Complaint: Heavy vaginal bleeding HPI: Patient with h/o fibroids, prior myomectomy with heavy bleeding, anemia, and 4.5 cm submucosal fibroid who desires definitive treatment.  Past Medical History:  Diagnosis Date  . Diabetes mellitus without complication ([place])    borderline    Past Surgical History:  Procedure Laterality Date  . MYOMECTOMY  2014  . Right Oophrectomy      Family History  Problem Relation Age of Onset  . Arthritis Mother   . Breast cancer Mother 69    Lumpectomy  . Hyperlipidemia Mother   . Hypertension Mother   . Kidney cancer Mother 20    Had ablasion  . Graves' disease Mother     Had thyroid removed  . Graves' disease Brother     Deceased   Social History:  reports that she has been smoking Cigarettes.  She has never used smokeless tobacco. She reports that she drinks alcohol. She reports that she does not use drugs.  Allergies: No Known Allergies  No current facility-administered medications on file prior to encounter.    Current Outpatient Prescriptions on File Prior to Encounter  Medication Sig Dispense Refill  . megestrol (MEGACE) 40 MG tablet Take 1 tablet (40 mg total) by mouth 2 (two) times daily. 30 tablet 3    A comprehensive review of systems was negative.  There were no vitals taken for this visit. General appearance: alert, cooperative and appears stated age Head: Normocephalic, without obvious abnormality, atraumatic Neck: supple, symmetrical, trachea midline Lungs: normal effort Heart: regular rate and rhythm Abdomen: soft, non-tender; bowel sounds normal; no masses,  no organomegaly Genitourinary Comments: BUS normal, vagina is pink and rugated, cervix is nulliparous without lesion, uterus is firm, enlarged, mass fill posterior cul-de-sac, approximately 12-14 wk size retroverted, no adnexal mass or tenderness. Extremities: extremities normal, atraumatic, no cyanosis or  edema Skin: Skin color, texture, turgor normal. No rashes or lesions Neurologic: Grossly normal   Lab Results  Component Value Date   WBC 4.6 08/04/2016   HGB 11.4 (L) 10/31/2014   HCT 29.4 (L) 08/04/2016   MCV 81 08/04/2016   PLT 372 08/04/2016   Lab Results  Component Value Date   PREGTESTUR Negative 08/04/2016   Ultrasound 08/13/16 Uterus Measurements: 13.5 x 8.9 x 9.2 cm. Right anterior fundal subserosal fibroid measures up to 4.6 cm. Posterior right body subserosal fibroid measures up to 3.8 cm. Central submucosal fibroid measures up to 4.3 cm.  Endometrium Thickness: 16 mm in thickness. There is a central solid slightly hypoechoic mass with vascularity which measures up to 1.7 cm. Burtis Junes this represents a submucosal, possibly pedunculated fibroid. This could represent a polyp.  Right ovary Measurements: 4.6 x 2.7 x 3.3 cm. Normal appearance/no adnexal mass.  Left ovary Measurements: Not visualized. No adnexal mass seen.  Other findings Small amount of free fluid in the pelvis.  IMPRESSION: Enlarged fibroid uterus. There is a large central submucosal fibroid measuring up to 4.3 cm. Separate hypoechoic area noted within the endometrium measured up to 1.7 cm could reflect a small submucosal/ pedunculated fibroid or endometrial polyp.  Assessment/Plan Principal Problem:   Menorrhagia Active Problems:   Fibroid uterus Anemia  For LAVH with bilateral salpingectomy Hysterectomy discussed at length, including alternative treatment with Aygestin or IUD, or OC's, UFE Risks include but are not limited to bleeding, infection, injury to surrounding structures, including bowel, bladder and ureters, blood clots, and death.  Likelihood of success is high.  Discussed LAVH is most likely way to perform and how long pt. Would be out of work. Risks of prior myomectomy discussed in detail as well as 2 prior vertical skin incisions. Risks of needing to convert to open, bowel injury,  possible colostomy discussed. She strongly desires definitive treatment with Hysterectomy Risks include but are not limited to bleeding, infection, injury to surrounding structures, including bowel, bladder and ureters, blood clots, and death.  Likelihood of success is high.    Nichole [name] 09/24/2016, 9:33 AM

## 2016-09-28 NOTE — Patient Instructions (Signed)
Your procedure is scheduled on:  Thursday, Oct 08, 2016  Enter through the [employer] [facility] at:  1:00PM  Pick up the phone at the desk and dial [phone].  Call this number if you have problems the morning of surgery: [phone].  Remember: Do NOT eat food:  After 6:30 AM day of surgery  Do NOT drink clear liquids after:  10:30 AM day of surgery  Take these medicines the morning of surgery with a SIP OF WATER:  None  Stop ALL herbal medications at this time  Do NOT smoke the day of surgery.  Do NOT wear jewelry (body piercing), metal hair clips/bobby pins, make-up, or nail polish. Do NOT wear lotions, powders, or perfumes.  You may wear deodorant. Do NOT shave for 48 hours prior to surgery. Do NOT bring valuables to the hospital. Contacts, dentures, or bridgework may not be worn into surgery.  Leave suitcase in car.  After surgery it may be brought to your room.  For patients admitted to the hospital, checkout time is 11:00 AM the day of discharge.  Bring a copy of your healthcare power of attorney and living will documents.

## 2016-09-29 ENCOUNTER — Encounter (HOSPITAL_COMMUNITY)
Admission: RE | Admit: 2016-09-29 | Discharge: 2016-09-29 | Disposition: A | Discharge status: Home / Self Care | Payer: Managed Care, Other (non HMO) | Source: Ambulatory Visit | Attending: Family Medicine | Admitting: Family Medicine

## 2016-09-29 ENCOUNTER — Encounter (HOSPITAL_COMMUNITY): Payer: Self-pay

## 2016-09-29 ENCOUNTER — Encounter: Payer: Self-pay | Admitting: *Deleted

## 2016-09-29 DIAGNOSIS — N920 Excessive and frequent menstruation with regular cycle: Secondary | ICD-10-CM | POA: Diagnosis not present

## 2016-09-29 DIAGNOSIS — Z01818 Encounter for other preprocedural examination: Secondary | ICD-10-CM | POA: Diagnosis not present

## 2016-09-29 DIAGNOSIS — D250 Submucous leiomyoma of uterus: Secondary | ICD-10-CM | POA: Diagnosis not present

## 2016-09-29 HISTORY — DX: Dyspnea, unspecified: R06.00

## 2016-09-29 HISTORY — DX: Prediabetes: R73.03

## 2016-09-29 HISTORY — DX: Anemia, unspecified: D64.9

## 2016-09-29 LAB — CBC
HCT: 24 % — ABNORMAL LOW (ref 36.0–46.0)
Hemoglobin: 7.3 g/dL — ABNORMAL LOW (ref 12.0–15.0)
MCH: 23.8 pg — AB (ref 26.0–34.0)
MCHC: 30.4 g/dL (ref 30.0–36.0)
MCV: 78.2 fL (ref 78.0–100.0)
Platelets: 417 10*3/uL — ABNORMAL HIGH (ref 150–400)
RBC: 3.07 MIL/uL — ABNORMAL LOW (ref 3.87–5.11)
RDW: 16.1 % — AB (ref 11.5–15.5)
WBC: 5.5 10*3/uL (ref 4.0–10.5)

## 2016-09-29 LAB — ABO/RH: ABO/RH(D): O NEG

## 2016-09-30 NOTE — Pre-Procedure Instructions (Signed)
Dr. Kennon Rounds made aware of abnormal CBC results.  Type and Crossmatch order placed in Epic per Dr. Virginia Crews request.

## 2016-10-08 ENCOUNTER — Ambulatory Visit (HOSPITAL_COMMUNITY): Payer: Managed Care, Other (non HMO) | Admitting: Anesthesiology

## 2016-10-08 ENCOUNTER — Encounter (HOSPITAL_COMMUNITY)
Admission: RE | Disposition: A | Discharge status: Home / Self Care | Payer: Self-pay | Source: Ambulatory Visit | Attending: Family Medicine

## 2016-10-08 ENCOUNTER — Encounter (HOSPITAL_COMMUNITY): Payer: Self-pay | Admitting: *Deleted

## 2016-10-08 ENCOUNTER — Observation Stay (HOSPITAL_COMMUNITY)
Admission: RE | Admit: 2016-10-08 | Discharge: 2016-10-09 | Disposition: A | Discharge status: Home / Self Care | Payer: Managed Care, Other (non HMO) | Source: Ambulatory Visit | Attending: Family Medicine | Admitting: Family Medicine

## 2016-10-08 DIAGNOSIS — F17210 Nicotine dependence, cigarettes, uncomplicated: Secondary | ICD-10-CM | POA: Diagnosis not present

## 2016-10-08 DIAGNOSIS — D649 Anemia, unspecified: Secondary | ICD-10-CM | POA: Diagnosis not present

## 2016-10-08 DIAGNOSIS — N920 Excessive and frequent menstruation with regular cycle: Secondary | ICD-10-CM | POA: Diagnosis not present

## 2016-10-08 DIAGNOSIS — D251 Intramural leiomyoma of uterus: Secondary | ICD-10-CM | POA: Insufficient documentation

## 2016-10-08 DIAGNOSIS — D252 Subserosal leiomyoma of uterus: Secondary | ICD-10-CM | POA: Diagnosis not present

## 2016-10-08 DIAGNOSIS — N879 Dysplasia of cervix uteri, unspecified: Principal | ICD-10-CM | POA: Insufficient documentation

## 2016-10-08 DIAGNOSIS — E119 Type 2 diabetes mellitus without complications: Secondary | ICD-10-CM | POA: Diagnosis not present

## 2016-10-08 DIAGNOSIS — D250 Submucous leiomyoma of uterus: Secondary | ICD-10-CM | POA: Insufficient documentation

## 2016-10-08 DIAGNOSIS — N938 Other specified abnormal uterine and vaginal bleeding: Secondary | ICD-10-CM | POA: Diagnosis present

## 2016-10-08 DIAGNOSIS — D259 Leiomyoma of uterus, unspecified: Secondary | ICD-10-CM | POA: Diagnosis present

## 2016-10-08 DIAGNOSIS — N921 Excessive and frequent menstruation with irregular cycle: Secondary | ICD-10-CM

## 2016-10-08 HISTORY — PX: LAPAROSCOPIC VAGINAL HYSTERECTOMY WITH SALPINGECTOMY: SHX6680

## 2016-10-08 LAB — CBC
HCT: 26.5 % — ABNORMAL LOW (ref 36.0–46.0)
HEMOGLOBIN: 7.8 g/dL — AB (ref 12.0–15.0)
MCH: 22.7 pg — AB (ref 26.0–34.0)
MCHC: 29.4 g/dL — ABNORMAL LOW (ref 30.0–36.0)
MCV: 77 fL — ABNORMAL LOW (ref 78.0–100.0)
PLATELETS: 432 10*3/uL — AB (ref 150–400)
RBC: 3.44 MIL/uL — AB (ref 3.87–5.11)
RDW: 16 % — ABNORMAL HIGH (ref 11.5–15.5)
WBC: 4.9 10*3/uL (ref 4.0–10.5)

## 2016-10-08 LAB — PREGNANCY, URINE: Preg Test, Ur: NEGATIVE

## 2016-10-08 LAB — GLUCOSE, CAPILLARY: GLUCOSE-CAPILLARY: 78 mg/dL (ref 65–99)

## 2016-10-08 LAB — PREPARE RBC (CROSSMATCH)

## 2016-10-08 SURGERY — HYSTERECTOMY, VAGINAL, LAPAROSCOPY-ASSISTED, WITH SALPINGECTOMY
Anesthesia: General | Site: Abdomen

## 2016-10-08 MED ORDER — SUGAMMADEX SODIUM 200 MG/2ML IV SOLN
INTRAVENOUS | Status: AC
  Filled 2016-10-08: qty 2

## 2016-10-08 MED ORDER — KETOROLAC TROMETHAMINE 30 MG/ML IJ SOLN
INTRAMUSCULAR | Status: DC | PRN
  Administered 2016-10-08: 30 mg via INTRAVENOUS

## 2016-10-08 MED ORDER — ONDANSETRON HCL 4 MG/2ML IJ SOLN
INTRAMUSCULAR | Status: DC | PRN
  Administered 2016-10-08: 4 mg via INTRAVENOUS

## 2016-10-08 MED ORDER — KETOROLAC TROMETHAMINE 30 MG/ML IJ SOLN: 30.0000 mg | Freq: Once | INTRAMUSCULAR | Status: DC

## 2016-10-08 MED ORDER — LIDOCAINE HCL (CARDIAC) 20 MG/ML IV SOLN
INTRAVENOUS | Status: DC | PRN
  Administered 2016-10-08: 100 mg via INTRAVENOUS

## 2016-10-08 MED ORDER — SCOPOLAMINE 1 MG/3DAYS TD PT72
MEDICATED_PATCH | TRANSDERMAL | Status: AC
  Filled 2016-10-08: qty 1

## 2016-10-08 MED ORDER — ROCURONIUM BROMIDE 100 MG/10ML IV SOLN
INTRAVENOUS | Status: AC
  Filled 2016-10-08: qty 1

## 2016-10-08 MED ORDER — SUGAMMADEX SODIUM 200 MG/2ML IV SOLN
INTRAVENOUS | Status: DC | PRN
  Administered 2016-10-08: 206.2 mg via INTRAVENOUS

## 2016-10-08 MED ORDER — SCOPOLAMINE 1 MG/3DAYS TD PT72
MEDICATED_PATCH | TRANSDERMAL | Status: AC
  Administered 2016-10-08: 1.5 mg via TRANSDERMAL
  Filled 2016-10-08: qty 1

## 2016-10-08 MED ORDER — DEXAMETHASONE SODIUM PHOSPHATE 10 MG/ML IJ SOLN
INTRAMUSCULAR | Status: DC | PRN
  Administered 2016-10-08: 8 mg via INTRAVENOUS

## 2016-10-08 MED ORDER — BUPIVACAINE-EPINEPHRINE (PF) 0.25% -1:200000 IJ SOLN
INTRAMUSCULAR | Status: AC
  Filled 2016-10-08: qty 30

## 2016-10-08 MED ORDER — METOCLOPRAMIDE HCL 5 MG/ML IJ SOLN: 10.0000 mg | Freq: Once | INTRAMUSCULAR | Status: DC | PRN

## 2016-10-08 MED ORDER — LACTATED RINGERS IV SOLN
INTRAVENOUS | Status: DC
  Administered 2016-10-08: 23:00:00 via INTRAVENOUS

## 2016-10-08 MED ORDER — CEFAZOLIN SODIUM-DEXTROSE 2-4 GM/100ML-% IV SOLN
2.0000 g | INTRAVENOUS | Status: AC
  Administered 2016-10-08: 2 g via INTRAVENOUS

## 2016-10-08 MED ORDER — NALOXONE HCL 0.4 MG/ML IJ SOLN: 0.4000 mg | INTRAMUSCULAR | Status: DC | PRN

## 2016-10-08 MED ORDER — SCOPOLAMINE 1 MG/3DAYS TD PT72
1.0000 | MEDICATED_PATCH | Freq: Once | TRANSDERMAL | Status: DC
  Administered 2016-10-08: 1.5 mg via TRANSDERMAL

## 2016-10-08 MED ORDER — FENTANYL CITRATE (PF) 100 MCG/2ML IJ SOLN
INTRAMUSCULAR | Status: DC | PRN
  Administered 2016-10-08 (×3): 50 ug via INTRAVENOUS
  Administered 2016-10-08: 25 ug via INTRAVENOUS
  Administered 2016-10-08: 100 ug via INTRAVENOUS
  Administered 2016-10-08: 50 ug via INTRAVENOUS
  Administered 2016-10-08: 25 ug via INTRAVENOUS

## 2016-10-08 MED ORDER — HYDROMORPHONE 1 MG/ML IV SOLN
INTRAVENOUS | Status: DC
  Administered 2016-10-08: 18:00:00 via INTRAVENOUS
  Filled 2016-10-08: qty 25

## 2016-10-08 MED ORDER — FENTANYL CITRATE (PF) 100 MCG/2ML IJ SOLN
25.0000 ug | INTRAMUSCULAR | Status: DC | PRN
  Administered 2016-10-08: 50 ug via INTRAVENOUS

## 2016-10-08 MED ORDER — MENTHOL 3 MG MT LOZG: 1.0000 | LOZENGE | OROMUCOSAL | Status: DC | PRN

## 2016-10-08 MED ORDER — LIDOCAINE HCL (CARDIAC) 20 MG/ML IV SOLN
INTRAVENOUS | Status: AC
  Filled 2016-10-08: qty 5

## 2016-10-08 MED ORDER — ROCURONIUM BROMIDE 100 MG/10ML IV SOLN
INTRAVENOUS | Status: DC | PRN
  Administered 2016-10-08: 50 mg via INTRAVENOUS
  Administered 2016-10-08 (×2): 10 mg via INTRAVENOUS

## 2016-10-08 MED ORDER — FENTANYL CITRATE (PF) 100 MCG/2ML IJ SOLN
INTRAMUSCULAR | Status: AC
  Filled 2016-10-08: qty 2

## 2016-10-08 MED ORDER — SODIUM CHLORIDE 0.9% FLUSH: 9.0000 mL | INTRAVENOUS | Status: DC | PRN

## 2016-10-08 MED ORDER — KETOROLAC TROMETHAMINE 30 MG/ML IJ SOLN
INTRAMUSCULAR | Status: AC
  Filled 2016-10-08: qty 1

## 2016-10-08 MED ORDER — SODIUM CHLORIDE 0.9 % IV SOLN
INTRAVENOUS | Status: DC | PRN
  Administered 2016-10-08: 14:00:00 via INTRAVENOUS

## 2016-10-08 MED ORDER — PROPOFOL 10 MG/ML IV BOLUS
INTRAVENOUS | Status: DC | PRN
  Administered 2016-10-08: 200 mg via INTRAVENOUS

## 2016-10-08 MED ORDER — KETOROLAC TROMETHAMINE 30 MG/ML IJ SOLN: 30.0000 mg | Freq: Four times a day (QID) | INTRAMUSCULAR | Status: DC

## 2016-10-08 MED ORDER — PROPOFOL 10 MG/ML IV BOLUS
INTRAVENOUS | Status: AC
  Filled 2016-10-08: qty 20

## 2016-10-08 MED ORDER — LACTATED RINGERS IV SOLN: INTRAVENOUS | Status: DC

## 2016-10-08 MED ORDER — BUPIVACAINE HCL (PF) 0.25 % IJ SOLN
INTRAMUSCULAR | Status: AC
  Filled 2016-10-08: qty 30

## 2016-10-08 MED ORDER — DIPHENHYDRAMINE HCL 12.5 MG/5ML PO ELIX: 12.5000 mg | ORAL_SOLUTION | Freq: Four times a day (QID) | ORAL | Status: DC | PRN

## 2016-10-08 MED ORDER — BUPIVACAINE HCL (PF) 0.25 % IJ SOLN
INTRAMUSCULAR | Status: DC | PRN
  Administered 2016-10-08: 5 mL

## 2016-10-08 MED ORDER — ONDANSETRON HCL 4 MG/2ML IJ SOLN
INTRAMUSCULAR | Status: AC
  Filled 2016-10-08: qty 2

## 2016-10-08 MED ORDER — MEPERIDINE HCL 25 MG/ML IJ SOLN: 6.2500 mg | INTRAMUSCULAR | Status: DC | PRN

## 2016-10-08 MED ORDER — FENTANYL CITRATE (PF) 250 MCG/5ML IJ SOLN
INTRAMUSCULAR | Status: AC
  Filled 2016-10-08: qty 5

## 2016-10-08 MED ORDER — MIDAZOLAM HCL 2 MG/2ML IJ SOLN
INTRAMUSCULAR | Status: AC
  Filled 2016-10-08: qty 2

## 2016-10-08 MED ORDER — ESTRADIOL 0.1 MG/GM VA CREA
TOPICAL_CREAM | VAGINAL | Status: AC
  Filled 2016-10-08: qty 42.5

## 2016-10-08 MED ORDER — OXYCODONE-ACETAMINOPHEN 5-325 MG PO TABS: 1.0000 | ORAL_TABLET | ORAL | Status: DC | PRN

## 2016-10-08 MED ORDER — DEXAMETHASONE SODIUM PHOSPHATE 10 MG/ML IJ SOLN
INTRAMUSCULAR | Status: AC
Start: 2016-10-08 — End: 2016-10-08
  Filled 2016-10-08: qty 1

## 2016-10-08 MED ORDER — GLYCOPYRROLATE 0.2 MG/ML IJ SOLN
INTRAMUSCULAR | Status: AC
  Filled 2016-10-08: qty 1

## 2016-10-08 MED ORDER — ONDANSETRON HCL 4 MG/2ML IJ SOLN: 4.0000 mg | Freq: Four times a day (QID) | INTRAMUSCULAR | Status: DC | PRN

## 2016-10-08 MED ORDER — LACTATED RINGERS IV SOLN
INTRAVENOUS | Status: DC
  Administered 2016-10-08 (×2): via INTRAVENOUS

## 2016-10-08 MED ORDER — MIDAZOLAM HCL 2 MG/2ML IJ SOLN
INTRAMUSCULAR | Status: DC | PRN
  Administered 2016-10-08: 2 mg via INTRAVENOUS

## 2016-10-08 MED ORDER — DIPHENHYDRAMINE HCL 50 MG/ML IJ SOLN: 12.5000 mg | Freq: Four times a day (QID) | INTRAMUSCULAR | Status: DC | PRN

## 2016-10-08 MED ORDER — SODIUM CHLORIDE 0.9 % IV SOLN: Freq: Once | INTRAVENOUS | Status: DC

## 2016-10-08 MED ORDER — IBUPROFEN 600 MG PO TABS
600.0000 mg | ORAL_TABLET | Freq: Four times a day (QID) | ORAL | Status: DC | PRN
  Administered 2016-10-08 – 2016-10-09 (×2): 600 mg via ORAL
  Filled 2016-10-08 (×2): qty 1

## 2016-10-08 MED ORDER — BUPIVACAINE-EPINEPHRINE 0.25% -1:200000 IJ SOLN
INTRAMUSCULAR | Status: DC | PRN
  Administered 2016-10-08: 20 mL

## 2016-10-08 MED ORDER — GLYCOPYRROLATE 0.2 MG/ML IJ SOLN
INTRAMUSCULAR | Status: DC | PRN
  Administered 2016-10-08: 0.1 mg via INTRAVENOUS

## 2016-10-08 SURGICAL SUPPLY — 38 items
CLOTH BEACON ORANGE TIMEOUT ST (SAFETY) ×2 IMPLANT
CONT PATH 16OZ SNAP LID 3702 (MISCELLANEOUS) ×2 IMPLANT
COVER BACK TABLE 60X90IN (DRAPES) ×2 IMPLANT
DECANTER SPIKE VIAL GLASS SM (MISCELLANEOUS) ×2 IMPLANT
DRAPE SHEET LG 3/4 BI-LAMINATE (DRAPES) ×2 IMPLANT
DRSG OPSITE POSTOP 3X4 (GAUZE/BANDAGES/DRESSINGS) IMPLANT
DURAPREP 26ML APPLICATOR (WOUND CARE) ×2 IMPLANT
ELECT REM PT RETURN 9FT ADLT (ELECTROSURGICAL) ×2
ELECTRODE REM PT RTRN 9FT ADLT (ELECTROSURGICAL) ×1 IMPLANT
GAUZE PACKING 2X5 YD STRL (GAUZE/BANDAGES/DRESSINGS) ×2 IMPLANT
GAUZE PACKING IODOFORM 2 (PACKING) ×2 IMPLANT
GLOVE BIOGEL PI IND STRL 6.5 (GLOVE) ×1 IMPLANT
GLOVE BIOGEL PI IND STRL 7.0 (GLOVE) ×5 IMPLANT
GLOVE BIOGEL PI INDICATOR 6.5 (GLOVE) ×1
GLOVE BIOGEL PI INDICATOR 7.0 (GLOVE) ×5
GLOVE ECLIPSE 7.0 STRL STRAW (GLOVE) ×2 IMPLANT
LEGGING LITHOTOMY PAIR STRL (DRAPES) ×2 IMPLANT
NS IRRIG 1000ML POUR BTL (IV SOLUTION) ×2 IMPLANT
PACK LAVH (CUSTOM PROCEDURE TRAY) ×2 IMPLANT
PACK ROBOTIC GOWN (GOWN DISPOSABLE) ×2 IMPLANT
PACK TRENDGUARD 450 HYBRID PRO (MISCELLANEOUS) IMPLANT
PROTECTOR NERVE ULNAR (MISCELLANEOUS) ×4 IMPLANT
SCISSORS LAP 5X35 DISP (ENDOMECHANICALS) IMPLANT
SET IRRIG TUBING LAPAROSCOPIC (IRRIGATION / IRRIGATOR) ×2 IMPLANT
SHEARS HARMONIC ACE PLUS 36CM (ENDOMECHANICALS) ×2 IMPLANT
SLEEVE XCEL OPT CAN 5 100 (ENDOMECHANICALS) ×2 IMPLANT
SUT VIC AB 0 CT1 18XCR BRD8 (SUTURE) ×2 IMPLANT
SUT VIC AB 0 CT1 36 (SUTURE) ×4 IMPLANT
SUT VIC AB 0 CT1 8-18 (SUTURE) ×2
SUT VIC AB 3-0 X1 27 (SUTURE) ×2 IMPLANT
SUT VICRYL 0 UR6 27IN ABS (SUTURE) ×4 IMPLANT
SUT VICRYL 1 TIES 12X18 (SUTURE) ×2 IMPLANT
TOWEL OR 17X24 6PK STRL BLUE (TOWEL DISPOSABLE) ×4 IMPLANT
TRAY FOLEY CATH SILVER 14FR (SET/KITS/TRAYS/PACK) ×2 IMPLANT
TRENDGUARD 450 HYBRID PRO PACK (MISCELLANEOUS)
TROCAR XCEL NON BLADE 8MM B8LT (ENDOMECHANICALS) ×2 IMPLANT
TROCAR XCEL NON-BLD 5MMX100MML (ENDOMECHANICALS) ×2 IMPLANT
WARMER LAPAROSCOPE (MISCELLANEOUS) ×2 IMPLANT

## 2016-10-08 NOTE — Anesthesia Postprocedure Evaluation (Signed)
Anesthesia Post Note  Patient: Advertising account executive  Procedure(s) Performed: Procedure(s) (LRB): LAPAROSCOPIC ASSISTED VAGINAL HYSTERECTOMY WITH RIGHT SALPINGECTOMY LYSIS OF ADHESIONS (N/A)  Patient location during evaluation: [facility] Anesthesia Type: General Level of consciousness: awake and alert Pain management: pain level controlled Vital Signs Assessment: post-procedure vital signs reviewed and stable Respiratory status: spontaneous breathing, nonlabored ventilation, respiratory function stable and patient connected to nasal cannula oxygen Cardiovascular status: blood pressure returned to baseline and stable Postop Assessment: no signs of nausea or vomiting Anesthetic complications: no        Last Vitals:  Vitals:   10/08/16 1830 10/08/16 1943  BP: (!) 113/57 127/67  Pulse: 76 79  Resp: 16 16  Temp: 36.8 C 37.1 C    Last Pain:  Vitals:   10/08/16 1943  TempSrc: Oral  PainSc:    Pain Goal: Patients Stated Pain Goal: 3 (10/08/16 1724)               Riki Sheer

## 2016-10-08 NOTE — Interval H&P Note (Signed)
History and Physical Interval Note:  10/08/2016 11:56 AM  Nichole [name]  has presented today for surgery, with the diagnosis of DUB  The various methods of treatment have been discussed with the patient and family. After consideration of risks, benefits and other options for treatment, the patient has consented to  Procedure(s): LAPAROSCOPIC ASSISTED VAGINAL HYSTERECTOMY WITH SALPINGECTOMY (Bilateral) as a surgical intervention .  The patient's history has been reviewed, patient examined, no change in status, stable for surgery.  I have reviewed the patient's chart and labs.  Questions were answered to the patient's satisfaction.     Donnamae Jude

## 2016-10-08 NOTE — Anesthesia Procedure Notes (Addendum)
Procedure Name: Intubation Date/Time: 10/08/2016 1:24 PM Performed by: Georgeanne Nim Pre-anesthesia Checklist: Patient identified, Patient being monitored, Timeout performed, Emergency Drugs available and Suction available Patient Re-evaluated:Patient Re-evaluated prior to inductionOxygen Delivery Method: Circle system utilized Preoxygenation: Pre-oxygenation with 100% oxygen Intubation Type: IV induction Ventilation: Mask ventilation without difficulty and Oral airway inserted - appropriate to patient size Laryngoscope Size: Mac and 3 Grade View: Grade II Tube size: 7.0 mm Number of attempts: 1 Airway Equipment and Method: Stylet (see note regarding blankets) Placement Confirmation: ETT inserted through vocal cords under direct vision,  positive ETCO2,  CO2 detector and breath sounds checked- equal and bilateral Secured at: 21 cm Tube secured with: Tape Dental Injury: Teeth and Oropharynx as per pre-operative assessment  Difficulty Due To: Difficulty was anticipated Comments: Lots of braided hair impeding optimal head extension;sniffing position obtained with blankets under chest and head

## 2016-10-08 NOTE — Op Note (Addendum)
Sidda Stowell  PROCEDURE DATE: 10/08/2016  PREOPERATIVE DIAGNOSIS: Fibroid uterus, symptomatic anemia, menorrhagia  POSTOPERATIVE DIAGNOSIS: Same  PROCEDURE: Laparoscopic assisted vaginal Hysterectomy, right salpingectomy, adhesiolysis  SURGEON:  Dr. Darron Doom  ASSISTANT: Dr. Verita Schneiders  ANESTHESIOLOGIST: Montez Hageman, MD  INDICATIONS: 41 y.o. yo female G2P1101 with aforementioned preoprative diagnoses here today for definitive surgical management.   Risks of surgery were discussed with the patient including but not limited to: bleeding which may require transfusion or reoperation; infection which may require antibiotics; injury to bowel, bladder, ureters or other surrounding organs; need for additional procedures including laparotomy; thromboembolic phenomenon, incisional problems and other postoperative/anesthesia complications. Written informed consent was obtained.    FINDINGS: 15 week sized uterus (590 gms), normal adnexa on right.  Left adnexa absent. Evidence of endometriosis  Normal upper abdomen.  PROCEDURE IN DETAIL:  The patient received intravenous antibiotics and had sequential compression devices applied to her lower extremities while in the preoperative area.  She was then taken to the operating room where general anesthesia was administered and was found to be adequate.  She was placed in the dorsal lithotomy position, and was prepped and draped in a sterile manner.  A Foley catheter was inserted into her bladder and attached to constant drainage and a Hulka uterine manipulator was then advanced into the uterus .  After an adequate timeout was performed, attention was then turned to the patient's abdomen where a 5-mm skin incision was made in the superior umbilicus.  A trocar was then placed through the incision.  After intraperitoneal placement was confirmed a pneumoperitoneum was obtained with carbon dioxide gas.  A survey of the patient's pelvis and abdomen revealed  the above anatomy.   Bilateral 5-mm lower quadrant ports  were then placed under direct visualization.  The pelvis was then carefully examined. Adhesions from the anterior peritoneum to the uterus were taken down with the Harmonic and blunt dissection.  On the right side, the round ligament was then clamped and transected with the Harmonic scapel device.  The uteroovarian ligament was also clamped and transected, and the fallopian tube on the right  was freed from the underlying mesosalpinx.  Excellent hemostasis was noted.The decision was made to leave the trocars in place and proceed with completing the hysterectomy via the vaginal route .  Attention was then turned to her pelvis.  A weighted speculum was then placed in the vagina, and the anterior and posterior lips of the cervix were grasped bilaterally with Edison Nasuti tenaculums.  The cervix was then injected circumferentially with dilute lidocaine with epinephrine.  The cervix was then circumferentially incised, and the anterior cul-de-sac was entered sharply without difficulty and a retractor was placed.  The same procedure was performed posteriorly and the posterior cul-de-sac was entered sharply without difficulty. The posterior peritoneum was then tagged to the posterior vagina. A long weighted speculum was inserted into the posterior cul-de-sac. The Heaney clamp was then used to clamp the uterosacral ligaments on either side. They were then cut and sutured ligated with 0 Vicryl, and were held with a tag for later identification. Of note, all sutures used in this case were 0 Vicryl unless otherwise noted. The cardinal ligaments were then clamped, cut and ligated bilaterally. The uterine vessels and broad ligaments were then serially clamped with the Heaney clamps, cut, and suture ligated on both sides. The uterus was morcellated at length to get the uterus out of the vagina. The right tubo-ovarian pedicle was clamped, tied with free tie  and then suture  ligated. The uterus was noted to be freed from all ligaments and was then delivered and sent to pathology. After completion of the hysterectomy, all pedicles from the uterosacral ligament to the cornua were examined hemostasis was confirmed.  The uterosacral pedicles were then affixed to the ipsilateral vaginal cuff angles using 0 Vicryl sutures.  The vaginal cuff was then closed in a running locked fashion with 0 Vicryl.  All instruments were then removed from the pelvis.   Attention was then returned to her abdomen which was insufflated again with carbon dioxide gas.  The laparoscope was used to survey the operative site, and it was found to be hemostatic. No intraoperative injury to other surrounding organs was noted. The right tube was separated from the mesosalpinx  And an 8 mm trocar was placed in the LLQ and the tube was removed. The abdomen was desufflated and all instruments were then removed from the patient's abdomen. The port sites were repaired with 4-0 vicryl and all of the sites were injected with 0.5% Marcaine. The vagina was then packed with 1 inch packing with Estrace.  The patient tolerated the procedures well.  All instruments, needles, and sponge counts were correct x 2. The patient was taken to the recovery room awake, extubated and in stable condition.

## 2016-10-08 NOTE — Transfer of Care (Signed)
Immediate Anesthesia Transfer of Care Note  Patient: Nichole [name]  Procedure(s) Performed: Procedure(s): LAPAROSCOPIC ASSISTED VAGINAL HYSTERECTOMY WITH RIGHT SALPINGECTOMY LYSIS OF ADHESIONS (N/A)  Patient Location: PACU  Anesthesia Type:General  Level of Consciousness: awake and alert   Airway & Oxygen Therapy: Patient Spontanous Breathing and Patient connected to nasal cannula oxygen  Post-op Assessment: Report given to RN and Post -op Vital signs reviewed and stable  Post vital signs: Reviewed and stable  Last Vitals:  Vitals:   10/08/16 1140  BP: 120/70  Pulse: 80  Resp: 18  Temp: 36.8 C    Last Pain:  Vitals:   10/08/16 1140  TempSrc: Oral      Patients Stated Pain Goal: 3 (15/37/94 3276)  Complications: No apparent anesthesia complications

## 2016-10-08 NOTE — Anesthesia Preprocedure Evaluation (Signed)
Anesthesia Evaluation  Patient identified by MRN, date of birth, ID band Patient awake    Reviewed: Allergy & Precautions, NPO status , Patient's Chart, lab work & pertinent test results  Airway Mallampati: II  TM Distance: >3 FB Neck ROM: Full    Dental no notable dental hx.    Pulmonary neg pulmonary ROS, Current Smoker,    Pulmonary exam normal breath sounds clear to auscultation       Cardiovascular negative cardio ROS Normal cardiovascular exam Rhythm:Regular Rate:Normal     Neuro/Psych negative neurological ROS  negative psych ROS   GI/Hepatic negative GI ROS, Neg liver ROS,   Endo/Other  negative endocrine ROS  Renal/GU negative Renal ROS  negative genitourinary   Musculoskeletal negative musculoskeletal ROS (+)   Abdominal   Peds negative pediatric ROS (+)  Hematology negative hematology ROS (+)   Anesthesia Other Findings   Reproductive/Obstetrics negative OB ROS                             Anesthesia Physical Anesthesia Plan  ASA: II  Anesthesia Plan: General   Post-op Pain Management:    Induction: Intravenous  Airway Management Planned: Oral ETT  Additional Equipment:   Intra-op Plan:   Post-operative Plan: Extubation in OR  Informed Consent: I have reviewed the patients History and Physical, chart, labs and discussed the procedure including the risks, benefits and alternatives for the proposed anesthesia with the patient or authorized representative who has indicated his/her understanding and acceptance.   Dental advisory given  Plan Discussed with: CRNA  Anesthesia Plan Comments:         Anesthesia Quick Evaluation

## 2016-10-09 ENCOUNTER — Encounter (HOSPITAL_COMMUNITY): Payer: Self-pay | Admitting: Family Medicine

## 2016-10-09 DIAGNOSIS — N879 Dysplasia of cervix uteri, unspecified: Secondary | ICD-10-CM | POA: Diagnosis not present

## 2016-10-09 LAB — CBC
HEMATOCRIT: 21.2 % — AB (ref 36.0–46.0)
Hemoglobin: 6.4 g/dL — CL (ref 12.0–15.0)
MCH: 22.9 pg — ABNORMAL LOW (ref 26.0–34.0)
MCHC: 30.2 g/dL (ref 30.0–36.0)
MCV: 75.7 fL — AB (ref 78.0–100.0)
PLATELETS: 350 10*3/uL (ref 150–400)
RBC: 2.8 MIL/uL — ABNORMAL LOW (ref 3.87–5.11)
RDW: 15.9 % — AB (ref 11.5–15.5)
WBC: 4 10*3/uL (ref 4.0–10.5)

## 2016-10-09 MED ORDER — FERROUS SULFATE 325 (65 FE) MG PO TABS: 325.0000 mg | ORAL_TABLET | Freq: Two times a day (BID) | ORAL | 1 refills | Status: DC

## 2016-10-09 MED ORDER — IBUPROFEN 600 MG PO TABS: 600.0000 mg | ORAL_TABLET | Freq: Four times a day (QID) | ORAL | 0 refills | Status: DC | PRN

## 2016-10-09 MED ORDER — OXYCODONE-ACETAMINOPHEN 5-325 MG PO TABS: 1.0000 | ORAL_TABLET | ORAL | 0 refills | Status: DC | PRN

## 2016-10-09 NOTE — Discharge Summary (Signed)
Physician Discharge Summary  Patient ID: Nichole [name] MRN: [id number] DOB/AGE: Jan 18, 1976 41 y.o.  Admit date: 10/08/2016 Discharge date:   Admission Diagnoses:  Principal Problem:   Menorrhagia Active Problems:   Fibroid uterus   Discharge Diagnoses:  Same  Past Medical History:  Diagnosis Date  . Anemia   . Dyspnea    with exertion  . Pre-diabetes     Surgeries: Procedure(s): LAPAROSCOPIC ASSISTED VAGINAL HYSTERECTOMY WITH RIGHT SALPINGECTOMY LYSIS OF ADHESIONS on 10/08/2016   Consultants: None  Discharged Condition: Improved  Hospital Course: Nichole [name] is an 41 y.o. female G2P1101 who was admitted 10/08/2016 with a chief complaint of vaginal bleeding and fibroids and anemia and found to have a diagnosis of Menorrhagia.  They were brought to the operating room on 10/08/2016 and underwent the above named procedures.    They were given perioperative antibiotics:  Anti-infectives    Start     Dose/Rate Route Frequency Ordered Stop   10/08/16 0025  ceFAZolin (ANCEF) IVPB 2g/100 mL premix     2 g 200 mL/hr over 30 Minutes Intravenous On call to O.R. 10/08/16 0025 10/08/16 1312    .  They were given sequential compression devices, early ambulation, and chemoprophylaxis for DVT prophylaxis.  They benefited maximally from their hospital stay and there were no complications.    Recent vital signs:  Vitals:   10/09/16 0553 10/09/16 0809  BP: 118/60 136/62  Pulse: 91 82  Resp: 16 18  Temp: 98.4 F (36.9 C) 99.1 F (37.3 C)   Discharge exam: Physical Examination: General appearance - alert, well appearing, and in no distress Chest - normal effort Heart - normal rate and regular rhythm Abdomen - soft, appropriately tender Extremities - Homan's sign negative bilaterally  Recent laboratory studies:  Results for orders placed or performed during the hospital encounter of 10/08/16  CBC  Result Value Ref Range   WBC 4.9 4.0 - 10.5 K/uL   RBC 3.44 (L) 3.87 -  5.11 MIL/uL   Hemoglobin 7.8 (L) 12.0 - 15.0 g/dL   HCT 26.5 (L) 36.0 - 46.0 %   MCV 77.0 (L) 78.0 - 100.0 fL   MCH 22.7 (L) 26.0 - 34.0 pg   MCHC 29.4 (L) 30.0 - 36.0 g/dL   RDW 16.0 (H) 11.5 - 15.5 %   Platelets 432 (H) 150 - 400 K/uL  Pregnancy, urine  Result Value Ref Range   Preg Test, Ur NEGATIVE NEGATIVE  Glucose, capillary  Result Value Ref Range   Glucose-Capillary 78 65 - 99 mg/dL  CBC  Result Value Ref Range   WBC 4.0 4.0 - 10.5 K/uL   RBC 2.80 (L) 3.87 - 5.11 MIL/uL   Hemoglobin 6.4 (LL) 12.0 - 15.0 g/dL   HCT 21.2 (L) 36.0 - 46.0 %   MCV 75.7 (L) 78.0 - 100.0 fL   MCH 22.9 (L) 26.0 - 34.0 pg   MCHC 30.2 30.0 - 36.0 g/dL   RDW 15.9 (H) 11.5 - 15.5 %   Platelets 350 150 - 400 K/uL  Prepare RBC  Result Value Ref Range   Order Confirmation ORDER PROCESSED BY BLOOD BANK     Allergies as of 10/09/2016   No Known Allergies     Medication List    TAKE these medications   cetirizine 10 MG tablet Commonly known as:  ZYRTEC Take 10 mg by mouth every evening.   ferrous sulfate 325 (65 FE) MG tablet Commonly known as:  FERROUSUL Take 1 tablet (325 mg total)  by mouth 2 (two) times daily.   ibuprofen 600 MG tablet Commonly known as:  ADVIL,MOTRIN Take 1 tablet (600 mg total) by mouth every 6 (six) hours as needed (mild pain).   oxyCODONE-acetaminophen 5-325 MG tablet Commonly known as:  PERCOCET/ROXICET Take 1-2 tablets by mouth every 4 (four) hours as needed for severe pain (moderate to severe pain (when tolerating fluids)).       Diagnostic Studies: No results found.  Disposition: Final discharge disposition not confirmed  Discharge Instructions    Call MD for:  persistant nausea and vomiting    Complete by:  As directed    Call MD for:  redness, tenderness, or signs of infection (pain, swelling, redness, odor or green/yellow discharge around incision site)    Complete by:  As directed    Call MD for:  severe uncontrolled pain    Complete by:  As  directed    Call MD for:  temperature >100.4    Complete by:  As directed    Diet - low sodium heart healthy    Complete by:  As directed    Driving Restrictions    Complete by:  As directed    None while on narcotic pain meds   Increase activity slowly    Complete by:  As directed    Lifting restrictions    Complete by:  As directed    Nothing > 20 lbs x 4-6 wks   Remove dressing in 48 hours    Complete by:  As directed    Sexual Activity Restrictions    Complete by:  As directed    None x 6 wks or until cleared in the office      [place] [facility] [place] [facility] Follow up in 2 week(s).   Specialty:  Obstetrics and Gynecology Why:  they will call you with appointment Contact information: [place] [phone]           Signed: Donnamae Jude 10/09/2016, 8:24 AM

## 2016-10-09 NOTE — Discharge Instructions (Signed)
Laparoscopically Assisted Vaginal Hysterectomy, Care After °Refer to this sheet in the next few weeks. These instructions provide you with information on caring for yourself after your procedure. Your health care provider may also give you more specific instructions. Your treatment has been planned according to current medical practices, but problems sometimes occur. Call your health care provider if you have any problems or questions after your procedure. °What can I expect after the procedure? °After your procedure, it is typical to have the following: °· Abdominal pain. You will be given pain medicine to control it. °· Sore throat from the breathing tube that was inserted during surgery. ° °Follow these instructions at home: °· Only take over-the-counter or prescription medicines for pain, discomfort, or fever as directed by your health care provider. °· Do not take aspirin. It can cause bleeding. °· Do not drive when taking pain medicine. °· Follow your health care provider's advice regarding diet, exercise, lifting, driving, and general activities. °· Resume your usual diet as directed and allowed. °· Get plenty of rest and sleep. °· Do not douche, use tampons, or have sexual intercourse for at least 6 weeks, or until your health care provider gives you permission. °· Change your bandages (dressings) as directed by your health care provider. °· Monitor your temperature and notify your health care provider of a fever. °· Take showers instead of baths for 2-3 weeks. °· Do not drink alcohol until your health care provider gives you permission. °· If you develop constipation, you may take a mild laxative with your health care provider's permission. Bran foods may help with constipation problems. Drinking enough fluids to keep your urine clear or pale yellow may help as well. °· Try to have someone home with you for 1-2 weeks to help around the house. °· Keep all of your follow-up appointments as directed by your  health care provider. °Contact a health care provider if: °· You have swelling, redness, or increasing pain around your incision sites. °· You have pus coming from your incision. °· You notice a bad smell coming from your incision. °· Your incision breaks open. °· You feel dizzy or lightheaded. °· You have pain or bleeding when you urinate. °· You have persistent diarrhea. °· You have persistent nausea and vomiting. °· You have abnormal vaginal discharge. °· You have a rash. °· You have any type of abnormal reaction or develop an allergy to your medicine. °· You have poor pain control with your prescribed medicine. °Get help right away if: °· You have a fever. °· You have severe abdominal pain. °· You have chest pain. °· You have shortness of breath. °· You faint. °· You have pain, swelling, or redness in your leg. °· You have heavy vaginal bleeding with blood clots. °This information is not intended to replace advice given to you by your health care provider. Make sure you discuss any questions you have with your health care provider. °Document Released: 04/30/2011 Document Revised: 10/17/2015 Document Reviewed: 11/24/2012 °Elsevier Interactive Patient Education © 2017 [employer] ° °

## 2016-10-10 LAB — TYPE AND SCREEN
ABO/RH(D): O NEG
ANTIBODY SCREEN: NEGATIVE
UNIT DIVISION: 0
UNIT DIVISION: 0

## 2016-10-10 LAB — BPAM RBC
Blood Product Expiration Date: 201806012359
Blood Product Expiration Date: 201806022359
ISSUE DATE / TIME: 201805171249
ISSUE DATE / TIME: 201805171249
Unit Type and Rh: 9500
Unit Type and Rh: 9500

## 2016-10-20 ENCOUNTER — Encounter: Payer: Self-pay | Admitting: Family Medicine

## 2016-10-20 ENCOUNTER — Ambulatory Visit (INDEPENDENT_AMBULATORY_CARE_PROVIDER_SITE_OTHER): Payer: Managed Care, Other (non HMO) | Admitting: Family Medicine

## 2016-10-20 VITALS — BP 103/70 | HR 89 | Wt 220.0 lb

## 2016-10-20 DIAGNOSIS — Z09 Encounter for follow-up examination after completed treatment for conditions other than malignant neoplasm: Secondary | ICD-10-CM

## 2016-10-20 NOTE — Progress Notes (Signed)
Pt here today for post-op check, reports occasional SOB with exertion.  Feels it is related to her low hgb level.  Is currently taking Iron twice daily.

## 2016-10-20 NOTE — Progress Notes (Signed)
   Subjective:    Patient ID: Nichole [name] is a 41 y.o. female presenting with Post op Check  on 10/20/2016  HPI: Here today for postop check. She is 12 d s/p TVH with bilateral salpingectomy. Feels well, except for symptoms of anemia. Working from home.   Review of Systems  Constitutional: Negative for chills and fever.  Respiratory: Negative for shortness of breath.   Cardiovascular: Negative for chest pain.  Gastrointestinal: Negative for abdominal pain, nausea and vomiting.  Genitourinary: Negative for dysuria.  Skin: Negative for rash.      Objective:    BP 103/70 (BP Location: Left Arm, Patient Position: Sitting, Cuff Size: Normal)   Pulse 89   Wt 220 lb (99.8 kg)   LMP 09/09/2016 (Approximate)   BMI 36.61 kg/m  Physical Exam  Constitutional: She is oriented to person, place, and time. She appears well-developed and well-nourished. No distress.  HENT:  Head: Normocephalic and atraumatic.  Eyes: No scleral icterus.  Neck: Neck supple.  Cardiovascular: Normal rate.   Pulmonary/Chest: Effort normal.  Abdominal: Soft.  Neurological: She is alert and oriented to person, place, and time.  Skin: Skin is warm and dry.  Psychiatric: She has a normal mood and affect.        Assessment & Plan:  Postop check - doing well--may return to work on 6/11 on light duty then to full duty on 11/26/2016. Note given. . Return in about 4 weeks (around 11/17/2016). for full exam.  Donnamae Jude 10/20/2016 5:11 PM

## 2016-11-17 ENCOUNTER — Ambulatory Visit (INDEPENDENT_AMBULATORY_CARE_PROVIDER_SITE_OTHER): Payer: Managed Care, Other (non HMO) | Admitting: Family Medicine

## 2016-11-17 ENCOUNTER — Encounter: Payer: Self-pay | Admitting: Family Medicine

## 2016-11-17 VITALS — BP 111/69 | HR 84 | Wt 231.0 lb

## 2016-11-17 DIAGNOSIS — Z09 Encounter for follow-up examination after completed treatment for conditions other than malignant neoplasm: Secondary | ICD-10-CM

## 2016-11-17 NOTE — Progress Notes (Signed)
   Subjective:    Patient ID: Nichole [name] is a 41 y.o. female presenting with Routine Post Op  on 11/17/2016  HPI: Patient notes no issues. She is back to work. She is s/p LAVH with right salpingectomy now 6 wks ago. Notes no fever, chills, nausea, vomiting, diarrhea.  Review of Systems  Constitutional: Negative for chills and fever.  Respiratory: Negative for shortness of breath.   Cardiovascular: Negative for chest pain.  Gastrointestinal: Negative for abdominal pain, nausea and vomiting.  Genitourinary: Negative for dysuria.  Skin: Negative for rash.      Objective:    BP 111/69   Pulse 84   Wt 231 lb (104.8 kg)   LMP 09/09/2016 (Approximate)   BMI 38.44 kg/m  Physical Exam  Constitutional: She is oriented to person, place, and time. She appears well-developed and well-nourished. No distress.  HENT:  Head: Normocephalic and atraumatic.  Eyes: No scleral icterus.  Neck: Neck supple.  Cardiovascular: Normal rate.   Pulmonary/Chest: Effort normal.  Abdominal: Soft.  Genitourinary:  Genitourinary Comments: Vaginal cuff is intact. There is no mass. There is still suture, but vagina is healing nicely.  Neurological: She is alert and oriented to person, place, and time.  Skin: Skin is warm and dry.  Psychiatric: She has a normal mood and affect.        Assessment & Plan:  Postop check - resume normal activities. F/u as needed.    Nichole [name] 11/17/2016 4:57 PM

## 2016-12-03 NOTE — Addendum Note (Signed)
Addendum  created 12/03/16 1437 by Montez Hageman, MD   Sign clinical note

## 2016-12-03 NOTE — Anesthesia Postprocedure Evaluation (Signed)
Anesthesia Post Note  Patient: Nichole [name]  Procedure(s) Performed: Procedure(s) (LRB): LAPAROSCOPIC ASSISTED VAGINAL HYSTERECTOMY WITH RIGHT SALPINGECTOMY LYSIS OF ADHESIONS (N/A)     Anesthesia Post Evaluation  Last Vitals:  Vitals:   10/09/16 0553 10/09/16 0809  BP: 118/60 136/62  Pulse: 91 82  Resp: 16 18  Temp: 36.9 C 37.3 C    Last Pain:  Vitals:   10/09/16 1040  TempSrc:   PainSc: 0-No pain                 Montez Hageman

## 2017-03-16 ENCOUNTER — Ambulatory Visit (INDEPENDENT_AMBULATORY_CARE_PROVIDER_SITE_OTHER): Payer: Managed Care, Other (non HMO) | Admitting: Podiatry

## 2017-03-16 DIAGNOSIS — L600 Ingrowing nail: Secondary | ICD-10-CM | POA: Diagnosis not present

## 2017-03-16 NOTE — Progress Notes (Signed)
   Subjective:    Patient ID: Nichole [name], female    DOB: [birth year], 41 y.o.   MRN: [id number]  HPI    Review of Systems  All other systems reviewed and are negative.      Objective:   Physical Exam        Assessment & Plan:

## 2017-03-17 NOTE — Patient Instructions (Signed)

## 2017-03-18 NOTE — Progress Notes (Signed)
   Subjective: Patient presents today for evaluation of intermittent pain to the medial and lateral borders of the left great toe that has been present for the past year. Patient is concerned for possible ingrown nail. She has been trimming her toenail and soaking in Epsom salt as treatment.  Patient presents today for further treatment and evaluation.   Past Medical History:  Diagnosis Date  . Anemia   . Dyspnea    with exertion  . Pre-diabetes     Objective:  General: Well developed, nourished, in no acute distress, alert and oriented x3   Dermatology: Skin is warm, dry and supple bilateral. Medial and lateral borders of the left great toe appears to be erythematous with evidence of an ingrowing nail. Pain on palpation noted to the border of the nail fold. The remaining nails appear unremarkable at this time. There are no open sores, lesions.  Vascular: Dorsalis Pedis artery and Posterior Tibial artery pedal pulses palpable. No lower extremity edema noted.   Neruologic: Grossly intact via light touch bilateral.  Musculoskeletal: Muscular strength within normal limits in all groups bilateral. Normal range of motion noted to all pedal and ankle joints.   Assesement: #1 Paronychia with ingrowing nail medial and lateral borders of the left great toe #2 Pain in toe #3 Incurvated nail  Plan of Care:  1. Patient evaluated.  2. Discussed treatment alternatives and plan of care. Explained nail avulsion procedure and post procedure course to patient. 3. Patient opted for permanent partial nail avulsion.  4. Prior to procedure, local anesthesia infiltration utilized using 3 ml of a 50:50 mixture of 2% plain lidocaine and 0.5% plain marcaine in a normal hallux block fashion and a betadine prep performed.  5. Partial permanent nail avulsion with chemical matrixectomy performed using [id number] applications of phenol followed by alcohol flush.  6. Light dressing applied. 7. Return to clinic in 2  weeks.   Edrick Kins, DPM [facility]  Dr. Edrick Kins, [address]                                        [place], [place] [address]                Office [phone]  Fax [phone]

## 2017-03-30 ENCOUNTER — Encounter: Payer: Managed Care, Other (non HMO) | Admitting: Podiatry

## 2017-04-05 NOTE — Progress Notes (Signed)
This encounter was created in error - please disregard.

## 2017-04-06 ENCOUNTER — Encounter: Payer: Self-pay | Admitting: Podiatry

## 2017-04-06 ENCOUNTER — Ambulatory Visit (INDEPENDENT_AMBULATORY_CARE_PROVIDER_SITE_OTHER): Payer: Managed Care, Other (non HMO) | Admitting: Podiatry

## 2017-04-06 DIAGNOSIS — L600 Ingrowing nail: Secondary | ICD-10-CM

## 2017-04-06 MED ORDER — AMOXICILLIN-POT CLAVULANATE 875-125 MG PO TABS: 1.0000 | ORAL_TABLET | Freq: Two times a day (BID) | ORAL | 0 refills | Status: DC

## 2017-04-06 MED ORDER — GENTAMICIN SULFATE 0.1 % EX CREA
1.0000 | TOPICAL_CREAM | Freq: Three times a day (TID) | CUTANEOUS | 1 refills | Status: DC
Start: 2017-04-06 — End: 2018-04-11

## 2017-04-08 NOTE — Progress Notes (Signed)
   Subjective: Patient presents today 2 weeks post ingrown nail permanent nail avulsion procedure to the medial and lateral borders of the left great toe. She reports a dog stepped on the toe a few days ago causing it to swell. She reports associated purulent drainage and soreness to the lateral border. She is here for further evaluation and treatment.     Past Medical History:  Diagnosis Date  . Anemia   . Dyspnea    with exertion  . Pre-diabetes     Objective: Skin is warm, dry and supple. Moderate purulent drainage noted to the medial and lateral borders with likely localized infection.  Assessment: #1 postop permanent partial nail avulsion medial and lateral borders of left great toe #2 open wound periungual nail fold of respective digit.   Plan of care: #1 patient was evaluated  #2 debridement of open wound was performed to the periungual border of the respective toe using a currette. Antibiotic ointment and Band-Aid was applied. #3 Prescription for Augmentin #20 given to patient. #4 Prescription for Gentamicin cream given to patient. #5 patient is to return to clinic in 2 weeks.   Edrick Kins, DPM [facility]  Dr. Edrick Kins, [address]                                        [place], [place] [address]                Office [phone]  Fax [phone]

## 2017-04-20 ENCOUNTER — Encounter: Payer: Managed Care, Other (non HMO) | Admitting: Podiatry

## 2017-04-27 NOTE — Progress Notes (Signed)
Err    This encounter was created in error - please disregard.

## 2018-04-11 ENCOUNTER — Encounter: Payer: Self-pay | Admitting: Primary Care

## 2018-04-11 ENCOUNTER — Ambulatory Visit (INDEPENDENT_AMBULATORY_CARE_PROVIDER_SITE_OTHER)
Admission: RE | Admit: 2018-04-11 | Discharge: 2018-04-11 | Disposition: A | Discharge status: Home / Self Care | Payer: Managed Care, Other (non HMO) | Source: Ambulatory Visit | Attending: Primary Care | Admitting: Primary Care

## 2018-04-11 ENCOUNTER — Ambulatory Visit (INDEPENDENT_AMBULATORY_CARE_PROVIDER_SITE_OTHER): Payer: Managed Care, Other (non HMO) | Admitting: Primary Care

## 2018-04-11 ENCOUNTER — Other Ambulatory Visit: Payer: Self-pay

## 2018-04-11 ENCOUNTER — Other Ambulatory Visit: Payer: Self-pay | Admitting: Primary Care

## 2018-04-11 VITALS — BP 122/78 | HR 94 | Temp 98.4°F | Ht 65.5 in | Wt 255.0 lb

## 2018-04-11 DIAGNOSIS — M79645 Pain in left finger(s): Secondary | ICD-10-CM | POA: Diagnosis not present

## 2018-04-11 DIAGNOSIS — Z1239 Encounter for other screening for malignant neoplasm of breast: Secondary | ICD-10-CM | POA: Diagnosis not present

## 2018-04-11 DIAGNOSIS — Z6841 Body Mass Index (BMI) 40.0 and over, adult: Secondary | ICD-10-CM

## 2018-04-11 DIAGNOSIS — E785 Hyperlipidemia, unspecified: Secondary | ICD-10-CM | POA: Insufficient documentation

## 2018-04-11 DIAGNOSIS — J3089 Other allergic rhinitis: Secondary | ICD-10-CM | POA: Insufficient documentation

## 2018-04-11 DIAGNOSIS — Z0000 Encounter for general adult medical examination without abnormal findings: Secondary | ICD-10-CM

## 2018-04-11 DIAGNOSIS — R739 Hyperglycemia, unspecified: Secondary | ICD-10-CM | POA: Diagnosis not present

## 2018-04-11 DIAGNOSIS — R7303 Prediabetes: Secondary | ICD-10-CM

## 2018-04-11 LAB — COMPREHENSIVE METABOLIC PANEL
ALBUMIN: 4 g/dL (ref 3.5–5.2)
ALT: 13 U/L (ref 0–35)
AST: 10 U/L (ref 0–37)
Alkaline Phosphatase: 58 U/L (ref 39–117)
BILIRUBIN TOTAL: 0.3 mg/dL (ref 0.2–1.2)
BUN: 12 mg/dL (ref 6–23)
CALCIUM: 9.2 mg/dL (ref 8.4–10.5)
CHLORIDE: 107 meq/L (ref 96–112)
CO2: 25 mEq/L (ref 19–32)
CREATININE: 0.94 mg/dL (ref 0.40–1.20)
GFR: 84.52 mL/min (ref >60.00)
Glucose, Bld: 110 mg/dL — ABNORMAL HIGH (ref 70–99)
Potassium: 3.9 mEq/L (ref 3.5–5.1)
Sodium: 138 mEq/L (ref 135–145)
Total Protein: 7.6 g/dL (ref 6.0–8.3)

## 2018-04-11 LAB — LIPID PANEL
CHOL/HDL RATIO: 5
Cholesterol: 184 mg/dL (ref 0–200)
HDL: 33.6 mg/dL — AB (ref >39.00)
LDL CALC: 132 mg/dL — AB (ref 0–99)
NonHDL: 150.64
TRIGLYCERIDES: 92 mg/dL (ref 0.0–149.0)
VLDL: 18.4 mg/dL (ref 0.0–40.0)

## 2018-04-11 LAB — HEMOGLOBIN A1C: HEMOGLOBIN A1C: 6.1 % (ref 4.6–6.5)

## 2018-04-11 MED ORDER — FLUTICASONE PROPIONATE 50 MCG/ACT NA SUSP: 2.0000 | Freq: Every day | NASAL | 6 refills | Status: DC

## 2018-04-11 NOTE — Assessment & Plan Note (Signed)
Tetanus due, she declines today. Influenza vaccination UTD. Mammogram due, pending. Discussed the importance of a healthy diet and regular exercise in order for weight loss, and to reduce the risk of any potential medical problems. Exam as noted, otherwise unremarkable. Labs pending. Follow up in 1 year for CPE.

## 2018-04-11 NOTE — Assessment & Plan Note (Signed)
Year round. Managed on Flonase daily, refills sent to pharmacy.

## 2018-04-11 NOTE — Assessment & Plan Note (Signed)
Poor diet and is not exercising. Discussed to work on both.

## 2018-04-11 NOTE — Progress Notes (Signed)
Subjective:    Patient ID: Nichole [name], female    DOB: [birth year], 42 y.o.   MRN: [id number]  HPI  Nichole [name] is a 42 year old female who presents today to re-establish care. She was last seen by me in June 2016.  BP Readings from Last 3 Encounters:  04/11/18 122/78  11/17/16 111/69  10/20/16 103/70     Immunizations: -Tetanus: Unsure, believes it's been about 10 years -Influenza: Completed    Diet: She endorses a fair diet Breakfast: Eggs, bacon, grits, toast, restaurant food Lunch: Fast food Dinner: Meat, vegetable, starch  Snacks: Fruit, chips Desserts: Infrequent  Beverages: Soda, crystal light  Exercise: She is not exercising  Eye exam: Completed in 2019 Dental exam: Completes annually  Pap Smear: Hysterectomy  Mammogram: Due   Review of Systems  Constitutional: Negative for unexpected weight change.  HENT: Negative for rhinorrhea.   Respiratory: Negative for cough and shortness of breath.   Cardiovascular: Negative for chest pain.  Gastrointestinal: Negative for constipation and diarrhea.  Genitourinary: Negative for difficulty urinating and vaginal bleeding.  Musculoskeletal: Positive for arthralgias. Negative for myalgias.       Pain to MCP joint to first digit of left hand. Taking Ibuprofen 600 mg every other day, CBD oil. No recent trauma.  Skin: Negative for rash.  Allergic/Immunologic: Negative for environmental allergies.  Neurological: Negative for dizziness, numbness and headaches.  Psychiatric/Behavioral: The patient is not nervous/anxious.        Past Medical History:  Diagnosis Date  . Anemia   . Dyspnea    with exertion  . Pre-diabetes      Social History   Socioeconomic History  . Marital status: Single    Spouse name: Not on file  . Number of children: Not on file  . Years of education: Not on file  . Highest education level: Not on file  Occupational History  . Not on file  Social Needs  . Financial resource strain: Not  on file  . Food insecurity:    Worry: Not on file    Inability: Not on file  . Transportation needs:    Medical: Not on file    Non-medical: Not on file  Tobacco Use  . Smoking status: Light Tobacco Smoker    Packs/day: 0.10    Years: 15.00    Pack years: 1.50    Types: Cigarettes  . Smokeless tobacco: Never Used  Substance and Sexual Activity  . Alcohol use: Yes    Alcohol/week: 0.0 standard drinks    Comment: rarely  . Drug use: No  . Sexual activity: Not Currently    Birth control/protection: None  Lifestyle  . Physical activity:    Days per week: Not on file    Minutes per session: Not on file  . Stress: Not on file  Relationships  . Social connections:    Talks on phone: Not on file    Gets together: Not on file    Attends religious service: Not on file    Active member of club or organization: Not on file    Attends meetings of clubs or organizations: Not on file    Relationship status: Not on file  . Intimate partner violence:    Fear of current or ex partner: Not on file    Emotionally abused: Not on file    Physically abused: Not on file    Forced sexual activity: Not on file  Other Topics Concern  . 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   Had 1 year old son.   Moved from [place] in Sept. 2015    Past Surgical History:  Procedure Laterality Date  . LAPAROSCOPIC VAGINAL HYSTERECTOMY WITH SALPINGECTOMY N/A 10/08/2016   Procedure: LAPAROSCOPIC ASSISTED VAGINAL HYSTERECTOMY WITH RIGHT SALPINGECTOMY LYSIS OF ADHESIONS;  Surgeon: Donnamae Jude, MD;  Location: [facility];  Service: Gynecology;  Laterality: N/A;  . MYOMECTOMY  2014  . Right Oophrectomy    . WISDOM TOOTH EXTRACTION      Family History  Problem Relation Age of Onset  . Arthritis Mother   . Breast cancer Mother 68       Lumpectomy  . Hyperlipidemia Mother   . Hypertension Mother   . Kidney cancer Mother 62        Had ablasion  . Graves' disease Mother        Had thyroid removed  . Graves' disease Brother        Deceased    No Known Allergies  Current Outpatient Medications on File Prior to Visit  Medication Sig Dispense Refill  . cetirizine (ZYRTEC) 10 MG tablet Take 10 mg by mouth every evening.     No current facility-administered medications on file prior to visit.     BP 122/78   Pulse 94   Temp 98.4 F (36.9 C) (Oral)   Ht 5' 5.5" (1.664 m)   Wt 255 lb (115.7 kg)   LMP 09/09/2016 (Approximate)   SpO2 97%   BMI 41.79 kg/m    Objective:   Physical Exam  Constitutional: She is oriented to person, place, and time. She appears well-nourished.  HENT:  Mouth/Throat: No oropharyngeal exudate.  Eyes: Pupils are equal, round, and reactive to light. EOM are normal.  Neck: Neck supple. No thyromegaly present.  Cardiovascular: Normal rate and regular rhythm.  Respiratory: Effort normal and breath sounds normal.  GI: Soft. Bowel sounds are normal. There is no tenderness.  Musculoskeletal: Normal range of motion.  Decreased strength to left thumb on exam. No obvious joint swelling.  Neurological: She is alert and oriented to person, place, and time.  Skin: Skin is warm and dry.  Psychiatric: She has a normal mood and affect.           Assessment & Plan:

## 2018-04-11 NOTE — Patient Instructions (Signed)
Complete xray(s) and lab prior to leaving today. I will notify you of your results once received.  Call the breast center to schedule your mammogram.  Start Ibuprofen 600 mg three times daily for 5 days. Take with food. Please message me if there is no improvement with your symptoms in one week.  Start exercising. You should be getting 150 minutes of moderate intensity exercise weekly.  It's important to improve your diet by reducing consumption of fast food, fried food, processed snack foods, sugary drinks. Increase consumption of fresh vegetables and fruits, whole grains, water.  Ensure you are drinking 64 ounces of water daily.  It was a pleasure to see you today!   Preventive Care 40-64 Years, Female Preventive care refers to lifestyle choices and visits with your health care provider that can promote health and wellness. What does preventive care include?  A yearly physical exam. This is also called an annual well check.  Dental exams once or twice a year.  Routine eye exams. Ask your health care provider how often you should have your eyes checked.  Personal lifestyle choices, including: ? Daily care of your teeth and gums. ? Regular physical activity. ? Eating a healthy diet. ? Avoiding tobacco and drug use. ? Limiting alcohol use. ? Practicing safe sex. ? Taking low-dose aspirin daily starting at age 86. ? Taking vitamin and mineral supplements as recommended by your health care provider. What happens during an annual well check? The services and screenings done by your health care provider during your annual well check will depend on your age, overall health, lifestyle risk factors, and family history of disease. Counseling Your health care provider may ask you questions about your:  Alcohol use.  Tobacco use.  Drug use.  Emotional well-being.  Home and relationship well-being.  Sexual activity.  Eating habits.  Work and work Statistician.  Method of  birth control.  Menstrual cycle.  Pregnancy history.  Screening You may have the following tests or measurements:  Height, weight, and BMI.  Blood pressure.  Lipid and cholesterol levels. These may be checked every 5 years, or more frequently if you are over 28 years old.  Skin check.  Lung cancer screening. You may have this screening every year starting at age 18 if you have a 30-pack-year history of smoking and currently smoke or have quit within the past 15 years.  Fecal occult blood test (FOBT) of the stool. You may have this test every year starting at age 50.  Flexible sigmoidoscopy or colonoscopy. You may have a sigmoidoscopy every 5 years or a colonoscopy every 10 years starting at age 52.  Hepatitis C blood test.  Hepatitis B blood test.  Sexually transmitted disease (STD) testing.  Diabetes screening. This is done by checking your blood sugar (glucose) after you have not eaten for a while (fasting). You may have this done every 1-3 years.  Mammogram. This may be done every 1-2 years. Talk to your health care provider about when you should start having regular mammograms. This may depend on whether you have a family history of breast cancer.  BRCA-related cancer screening. This may be done if you have a family history of breast, ovarian, tubal, or peritoneal cancers.  Pelvic exam and Pap test. This may be done every 3 years starting at age 11. Starting at age 44, this may be done every 5 years if you have a Pap test in combination with an HPV test.  Bone density scan. This is  done to screen for osteoporosis. You may have this scan if you are at high risk for osteoporosis.  Discuss your test results, treatment options, and if necessary, the need for more tests with your health care provider. Vaccines Your health care provider may recommend certain vaccines, such as:  Influenza vaccine. This is recommended every year.  Tetanus, diphtheria, and acellular pertussis  (Tdap, Td) vaccine. You may need a Td booster every 10 years.  Varicella vaccine. You may need this if you have not been vaccinated.  Zoster vaccine. You may need this after age 44.  Measles, mumps, and rubella (MMR) vaccine. You may need at least one dose of MMR if you were born in 1957 or later. You may also need a second dose.  Pneumococcal 13-valent conjugate (PCV13) vaccine. You may need this if you have certain conditions and were not previously vaccinated.  Pneumococcal polysaccharide (PPSV23) vaccine. You may need one or two doses if you smoke cigarettes or if you have certain conditions.  Meningococcal vaccine. You may need this if you have certain conditions.  Hepatitis A vaccine. You may need this if you have certain conditions or if you travel or work in places where you may be exposed to hepatitis A.  Hepatitis B vaccine. You may need this if you have certain conditions or if you travel or work in places where you may be exposed to hepatitis B.  Haemophilus influenzae type b (Hib) vaccine. You may need this if you have certain conditions.  Talk to your health care provider about which screenings and vaccines you need and how often you need them. This information is not intended to replace advice given to you by your health care provider. Make sure you discuss any questions you have with your health care provider. Document Released: 06/07/2015 Document Revised: 01/29/2016 Document Reviewed: 03/12/2015 Elsevier Interactive Patient Education  [employer].

## 2018-04-11 NOTE — Assessment & Plan Note (Signed)
Present for the last 3 weeks.  Suspect inflammation to the joint. Check plain films to rule out fracture given weakness on exam.

## 2018-04-11 NOTE — Assessment & Plan Note (Signed)
A1C pending. 

## 2018-04-11 NOTE — Assessment & Plan Note (Signed)
Worse on recent lab testing. Recommended to work on diet and start exercising. Repeat in 6 months.

## 2018-04-11 NOTE — Assessment & Plan Note (Signed)
Recent LDL above goal. Recommended to work on diet, start exercising. Repeat in 6 months.

## 2018-08-02 ENCOUNTER — Ambulatory Visit (INDEPENDENT_AMBULATORY_CARE_PROVIDER_SITE_OTHER)
Admission: RE | Admit: 2018-08-02 | Discharge: 2018-08-02 | Disposition: A | Discharge status: Home / Self Care | Payer: Managed Care, Other (non HMO) | Source: Ambulatory Visit | Attending: Family Medicine | Admitting: Family Medicine

## 2018-08-02 ENCOUNTER — Encounter: Payer: Self-pay | Admitting: Family Medicine

## 2018-08-02 ENCOUNTER — Ambulatory Visit (INDEPENDENT_AMBULATORY_CARE_PROVIDER_SITE_OTHER): Payer: Managed Care, Other (non HMO) | Admitting: Family Medicine

## 2018-08-02 VITALS — BP 134/62 | HR 77 | Temp 98.2°F | Ht 65.5 in | Wt 247.5 lb

## 2018-08-02 DIAGNOSIS — S63642A Sprain of metacarpophalangeal joint of left thumb, initial encounter: Secondary | ICD-10-CM | POA: Diagnosis not present

## 2018-08-02 DIAGNOSIS — M79642 Pain in left hand: Secondary | ICD-10-CM

## 2018-08-02 NOTE — Patient Instructions (Signed)
You may have a small fracture. I will follow-up the final read and let you know.   If a fracture:   1) Wear the thumb spica splint every day -- even a night, essentially wear this like a cast 2) Return in 1 week   If no fracture  1) Wear the splint for comfort    Call immediately if you notice that you pain is worsening.   Thumb Sprain  A thumb sprain is an injury to one of the bands of tissue (ligaments) that connect the bones in your thumb. The ligament may be stretched too much, or it may be torn. A tear can be either partial or complete. How bad, or severe, the sprain is depends on how much of the ligament was damaged or torn. What are the causes? A thumb sprain is often caused by a fall or an accident, such as when you hold your hands out to catch something or to protect yourself. What increases the risk? This injury is more likely to occur in people who play sports that involve:  A risk of falling, such as skiing.  Catching an object, such as basketball. What are the signs or symptoms? Symptoms of this condition include:  Not being able to move the thumb normally.  Swelling.  Tenderness.  Bruising. How is this diagnosed? This condition may be diagnosed based on:  Your symptoms and medical history. Your health care provider may ask about any recent injuries to your thumb.  A physical exam.  Imaging studies such as X-ray, ultrasound, or MRI. How is this treated? Treatment for this condition depends on how severe your sprain is.  If your ligament is overstretched or partially torn, treatment usually involves keeping your thumb in a fixed position (immobilization) for at least 4 to 6 weeks. Your health care provider will apply a bandage, cast, or splint to keep your thumb from moving until it heals.  If your ligament is fully torn, you may need surgery to reconnect the ligament to the bone. After surgery, you will need to wear a cast or splint on your thumb. Your  health care provider may also recommend physical therapy to strengthen your thumb. Follow these instructions at home: If you have a splint or bandage:  Wear the splint or bandage as told by your health care provider. Remove it only as told by your health care provider.  Loosen the splint or bandage if your thumb or fingers tingle, become numb, or turn cold and blue.  Keep the splint or bandage clean and dry. If you have a cast:  Do not stick anything inside the cast to scratch your skin. Doing that increases your risk of infection.  Check the skin around the cast every day. Tell your health care provider about any concerns.  You may put lotion on dry skin around the edges of the cast. Do not put lotion on the skin underneath the cast.  Keep the cast clean and dry. Bathing  Do not take baths, swim, or use a hot tub until your health care provider approves. Ask your health care provider if you may take showers. You may only be allowed to take sponge baths.  If your splint, bandage, or cast is not waterproof: ? Do not let it get wet. ? Cover it with a watertight covering to protect it from water when you take a bath or shower. Managing pain, stiffness, and swelling   If directed, put ice on your thumb: ? If  you have a removable splint, remove it as told by your health care provider. ? Put ice in a plastic bag. ? Place a towel between your skin and the bag, or between your cast and the bag. ? Leave the ice on for 20 minutes, 2-3 times a day.  Move your fingers often to avoid stiffness and to lessen swelling.  Raise (elevate) your hand above the level of your heart while you are sitting or lying down. Activity  Return to your normal activities as told by your health care provider. Ask your health care provider what activities are safe for you.  Do physical therapy exercises as directed. After your splint or cast is removed, your health care provider may recommend that you: ? Move  your thumb in circles. ? Touch your thumb to your pinky finger. ? Do these exercises several times a day.  Ask your health care provider if you may use a hand exerciser to strengthen your muscles.  If your thumb feels stiff while you are exercising it, try doing the exercises while soaking your hand in warm water. Driving  Do not drive until your health care provider approves.  Donot drive or use heavy machinery while taking prescription pain medicine. General instructions  Do not put pressure on any part of the cast or splint until it is fully hardened, if applicable. This may take several hours.  Take over-the-counter and prescription medicines only as told by your health care provider.  Do not use any products that contain nicotine or tobacco, such as cigarettes and e-cigarettes. These can delay healing. If you need help quitting, ask your health care provider.  Do not wear rings on your injured thumb.  Keep all follow-up visits as told by your health care provider. This is important. Contact a health care provider if you have:  Pain that gets worse or does not get better with medicine.  Bruising or swelling that gets worse.  Your cast or splint is damaged. Get help right away if:  Your thumb feels numb, tingles, turns cold, or turns blue, even after loosening your splint or bandage (if applicable). Summary  A thumb sprain is an injury to one of the bands of tissue (ligaments) that connect the bones in your thumb.  Thumb sprains are more likely to occur in people who play sports that involve a risk of falling or having to catch an object.  Treatment will depend on how severe the sprain is, but it will require keeping the thumb in a fixed position. It might require surgery.  Make sure you understand and follow all of your health care provider's instructions for home care. This information is not intended to replace advice given to you by your health care provider. Make  sure you discuss any questions you have with your health care provider. Document Released: 06/18/2004 Document Revised: 06/03/2017 Document Reviewed: 06/03/2017 Elsevier Interactive Patient Education  2019 [employer].

## 2018-08-02 NOTE — Progress Notes (Signed)
Subjective:     Nichole [name] is a 43 y.o. female presenting for Hand Pain (left. [place] out of the bed on 07/30/2018. landed on the left side of her body. Has been icing it, taking Tylenol and Motrin, elevating it and applying a brace for compression.)     Hand Pain   The incident occurred 3 to 5 days ago. The incident occurred at home. The injury mechanism was a fall. The pain is present in the left hand. Quality: pulsing pain. The pain does not radiate. The pain is moderate. Associated symptoms include muscle weakness. Pertinent negatives include no chest pain, numbness or tingling. She has tried ice, acetaminophen, NSAIDs and elevation (tramadol, compression) for the symptoms. The treatment provided mild relief.   - Tried to move her hand on Saturday when it happened and thought it was dislocated - a popping pain  Review of Systems  Cardiovascular: Negative for chest pain.  Neurological: Negative for tingling and numbness.     Social History   Tobacco Use  Smoking Status Former Smoker  . Packs/day: 0.10  . Years: 15.00  . Pack years: 1.50  . Types: Cigarettes  . Last attempt to quit: 01/23/2017  . Years since quitting: 1.5  Smokeless Tobacco Never Used        Objective:    BP Readings from Last 3 Encounters:  08/02/18 134/62  04/11/18 122/78  11/17/16 111/69   Wt Readings from Last 3 Encounters:  08/02/18 247 lb 8 oz (112.3 kg)  04/11/18 255 lb (115.7 kg)  11/17/16 231 lb (104.8 kg)    BP 134/62   Pulse 77   Temp 98.2 F (36.8 C)   Ht 5' 5.5" (1.664 m)   Wt 247 lb 8 oz (112.3 kg)   LMP 09/09/2016 (Approximate)   BMI 40.56 kg/m    Physical Exam Constitutional:      General: She is not in acute distress.    Appearance: She is well-developed. She is not diaphoretic.  HENT:     Right Ear: External ear normal.     Left Ear: External ear normal.     Nose: Nose normal.  Eyes:     Conjunctiva/sclera: Conjunctivae normal.  Neck:     Musculoskeletal: Neck  supple.  Cardiovascular:     Rate and Rhythm: Normal rate.  Pulmonary:     Effort: Pulmonary effort is normal.  Musculoskeletal:     Comments: Left Hand:  Inspection: mild swelling, no erythema Palpation: TTP along the first metacarpal bone ROM: decreased thumb opposition, flexion Strength: decreased due to pain in all thumb testing   Skin:    General: Skin is warm and dry.     Capillary Refill: Capillary refill takes less than 2 seconds.  Neurological:     Mental Status: She is alert. Mental status is at baseline.  Psychiatric:        Mood and Affect: Mood normal.        Behavior: Behavior normal.     Left Hand XR - no clear fracture       Assessment & Plan:   Problem List Items Addressed This Visit    None    Visit Diagnoses    Sprain of metacarpophalangeal (MCP) joint of left thumb, initial encounter    -  Primary   Left hand pain       Relevant Orders   DG Hand Complete Left     Suspected sprain, but will follow-up final read of XR.  Placed in thumb spica removal splint for treatment Recommended 1 week follow-up   Return in about 1 week (around 08/09/2018).  Lesleigh Noe, MD

## 2018-08-24 DIAGNOSIS — J3089 Other allergic rhinitis: Secondary | ICD-10-CM

## 2018-08-24 MED ORDER — ALBUTEROL SULFATE HFA 108 (90 BASE) MCG/ACT IN AERS: 1.0000 | INHALATION_SPRAY | Freq: Four times a day (QID) | RESPIRATORY_TRACT | 0 refills | Status: DC | PRN

## 2018-08-24 NOTE — Telephone Encounter (Signed)
Have not prescribe. Last office visit on 04/11/2018. No future appointment

## 2019-04-16 ENCOUNTER — Other Ambulatory Visit: Payer: Self-pay | Admitting: Primary Care

## 2019-04-16 DIAGNOSIS — R7303 Prediabetes: Secondary | ICD-10-CM

## 2019-04-16 DIAGNOSIS — E785 Hyperlipidemia, unspecified: Secondary | ICD-10-CM

## 2019-04-24 ENCOUNTER — Other Ambulatory Visit (INDEPENDENT_AMBULATORY_CARE_PROVIDER_SITE_OTHER): Payer: Managed Care, Other (non HMO)

## 2019-04-24 ENCOUNTER — Other Ambulatory Visit: Payer: Self-pay

## 2019-04-24 DIAGNOSIS — R7303 Prediabetes: Secondary | ICD-10-CM

## 2019-04-24 DIAGNOSIS — E785 Hyperlipidemia, unspecified: Secondary | ICD-10-CM | POA: Diagnosis not present

## 2019-04-24 LAB — CBC
HCT: 35.1 % — ABNORMAL LOW (ref 36.0–46.0)
Hemoglobin: 11.5 g/dL — ABNORMAL LOW (ref 12.0–15.0)
MCHC: 32.8 g/dL (ref 30.0–36.0)
MCV: 92.2 fl (ref 78.0–100.0)
Platelets: 215 10*3/uL (ref 150.0–400.0)
RBC: 3.8 Mil/uL — ABNORMAL LOW (ref 3.87–5.11)
RDW: 13.1 % (ref 11.5–15.5)
WBC: 4 10*3/uL (ref 4.0–10.5)

## 2019-04-24 LAB — COMPREHENSIVE METABOLIC PANEL
ALT: 11 U/L (ref 0–35)
AST: 11 U/L (ref 0–37)
Albumin: 3.9 g/dL (ref 3.5–5.2)
Alkaline Phosphatase: 60 U/L (ref 39–117)
BUN: 14 mg/dL (ref 6–23)
CO2: 25 mEq/L (ref 19–32)
Calcium: 9.2 mg/dL (ref 8.4–10.5)
Chloride: 107 mEq/L (ref 96–112)
Creatinine, Ser: 0.91 mg/dL (ref 0.40–1.20)
GFR: 82.13 mL/min (ref >60.00)
Glucose, Bld: 98 mg/dL (ref 70–99)
Potassium: 3.7 mEq/L (ref 3.5–5.1)
Sodium: 139 mEq/L (ref 135–145)
Total Bilirubin: 0.5 mg/dL (ref 0.2–1.2)
Total Protein: 7.5 g/dL (ref 6.0–8.3)

## 2019-04-24 LAB — LIPID PANEL
Cholesterol: 174 mg/dL (ref 0–200)
HDL: 31.4 mg/dL — ABNORMAL LOW (ref >39.00)
LDL Cholesterol: 134 mg/dL — ABNORMAL HIGH (ref 0–99)
NonHDL: 142.91
Total CHOL/HDL Ratio: 6
Triglycerides: 47 mg/dL (ref 0.0–149.0)
VLDL: 9.4 mg/dL (ref 0.0–40.0)

## 2019-04-24 LAB — HEMOGLOBIN A1C: Hgb A1c MFr Bld: 5.7 % (ref 4.6–6.5)

## 2019-05-01 ENCOUNTER — Encounter: Payer: Self-pay | Admitting: Primary Care

## 2019-05-01 ENCOUNTER — Other Ambulatory Visit: Payer: Self-pay

## 2019-05-01 ENCOUNTER — Ambulatory Visit (INDEPENDENT_AMBULATORY_CARE_PROVIDER_SITE_OTHER): Payer: Managed Care, Other (non HMO) | Admitting: Primary Care

## 2019-05-01 VITALS — BP 122/82 | HR 80 | Temp 97.3°F | Ht 65.5 in | Wt 227.0 lb

## 2019-05-01 DIAGNOSIS — J3089 Other allergic rhinitis: Secondary | ICD-10-CM

## 2019-05-01 DIAGNOSIS — Z1231 Encounter for screening mammogram for malignant neoplasm of breast: Secondary | ICD-10-CM | POA: Diagnosis not present

## 2019-05-01 DIAGNOSIS — R7303 Prediabetes: Secondary | ICD-10-CM

## 2019-05-01 DIAGNOSIS — E785 Hyperlipidemia, unspecified: Secondary | ICD-10-CM

## 2019-05-01 DIAGNOSIS — M79645 Pain in left finger(s): Secondary | ICD-10-CM

## 2019-05-01 DIAGNOSIS — Z0000 Encounter for general adult medical examination without abnormal findings: Secondary | ICD-10-CM

## 2019-05-01 MED ORDER — CETIRIZINE HCL 10 MG PO TABS: 10.0000 mg | ORAL_TABLET | Freq: Every evening | ORAL | 0 refills | Status: DC

## 2019-05-01 NOTE — Assessment & Plan Note (Signed)
Patient unsure about tetanus, she will check records. Influenza vaccination UTD.  Pap smear UTD. Mammogram due, orders placed. Encouraged a healthy diet, regular exercise. Exam today benign. Labs reviewed.

## 2019-05-01 NOTE — Progress Notes (Signed)
Subjective:    Patient ID: Nichole [name], female    DOB: 01-26-1976, 43 y.o.   MRN: [id number]  HPI  Nichole [name] is a 43 year old female who presents today for complete physical.  Immunizations: -Tetanus: Unsure, she will check records  -Influenza: Completed this season   Diet: She endorses a healthy diet, both home cooked meals and take out food. She is drinking mostly water, crystal light and water.  Exercise: She is not exercising.  Eye exam: Completed several years ago Dental exam: Completes semi-annually   Pap Smear: Completed in 2018 Mammogram: Never completed   BP Readings from Last 3 Encounters:  05/01/19 122/82  08/02/18 134/62  04/11/18 122/78     Review of Systems  Constitutional: Negative for unexpected weight change.  HENT: Negative for rhinorrhea.   Respiratory: Negative for cough and shortness of breath.   Cardiovascular: Negative for chest pain.  Gastrointestinal: Negative for constipation and diarrhea.  Genitourinary: Negative for difficulty urinating and menstrual problem.  Musculoskeletal: Positive for arthralgias. Negative for myalgias.       Chronic left thumb pain  Skin: Negative for rash.  Allergic/Immunologic: Negative for environmental allergies.  Neurological: Negative for dizziness, numbness and headaches.  Psychiatric/Behavioral: The patient is not nervous/anxious.        Past Medical History:  Diagnosis Date  . Anemia   . Dyspnea    with exertion  . Pre-diabetes      Social History   Socioeconomic History  . Marital status: Single    Spouse name: Not on file  . Number of children: Not on file  . Years of education: Not on file  . Highest education level: Not on file  Occupational History  . Not on file  Social Needs  . Financial resource strain: Not on file  . Food insecurity    Worry: Not on file    Inability: Not on file  . Transportation needs    Medical: Not on file    Non-medical: Not on file  Tobacco Use  .  Smoking status: Former Smoker    Packs/day: 0.10    Years: 15.00    Pack years: 1.50    Types: Cigarettes    Quit date: 01/23/2017    Years since quitting: 2.2  . Smokeless tobacco: Never Used  Substance and Sexual Activity  . Alcohol use: Yes    Alcohol/week: 0.0 standard drinks    Comment: rarely  . Drug use: No  . Sexual activity: Not Currently    Birth control/protection: None  Lifestyle  . Physical activity    Days per week: Not on file    Minutes per session: Not on file  . Stress: Not on file  Relationships  . Social Herbalist on phone: Not on file    Gets together: Not on file    Attends religious service: Not on file    Active member of club or organization: Not on file    Attends meetings of clubs or organizations: Not on file    Relationship status: Not on file  . Intimate partner violence    Fear of current or ex partner: Not on file    Emotionally abused: Not on file    Physically abused: Not on file    Forced sexual activity: Not on file  Other Topics Concern  . 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   Had 60 year old son.   Moved from [place] in Sept. 2015    Past Surgical History:  Procedure Laterality Date  . LAPAROSCOPIC VAGINAL HYSTERECTOMY WITH SALPINGECTOMY N/A 10/08/2016   Procedure: LAPAROSCOPIC ASSISTED VAGINAL HYSTERECTOMY WITH RIGHT SALPINGECTOMY LYSIS OF ADHESIONS;  Surgeon: Donnamae Jude, MD;  Location: [facility];  Service: Gynecology;  Laterality: N/A;  . MYOMECTOMY  2014  . Right Oophrectomy    . WISDOM TOOTH EXTRACTION      Family History  Problem Relation Age of Onset  . Arthritis Mother   . Breast cancer Mother 17       Lumpectomy  . Hyperlipidemia Mother   . Hypertension Mother   . Kidney cancer Mother 2       Had ablasion  . Graves' disease Mother        Had thyroid removed  . Graves' disease Brother        Deceased    No Known  Allergies  Current Outpatient Medications on File Prior to Visit  Medication Sig Dispense Refill  . albuterol (PROVENTIL HFA;VENTOLIN HFA) 108 (90 Base) MCG/ACT inhaler Inhale 1-2 puffs into the lungs every 6 (six) hours as needed for wheezing or shortness of breath. 1 Inhaler 0  . cetirizine (ZYRTEC) 10 MG tablet Take 10 mg by mouth every evening.    . fluticasone (FLONASE) 50 MCG/ACT nasal spray Place 2 sprays into both nostrils daily. 16 g 6   No current facility-administered medications on file prior to visit.     BP 122/82   Pulse 80   Temp (!) 97.3 F (36.3 C) (Temporal)   Ht 5' 5.5" (1.664 m)   Wt 227 lb (103 kg)   LMP 09/09/2016 (Approximate)   SpO2 98%   BMI 37.20 kg/m    Objective:   Physical Exam  Constitutional: She is oriented to person, place, and time. She appears well-nourished.  HENT:  Right Ear: Tympanic membrane and ear canal normal.  Left Ear: Tympanic membrane and ear canal normal.  Mouth/Throat: Oropharynx is clear and moist.  Eyes: Pupils are equal, round, and reactive to light. EOM are normal.  Neck: Neck supple.  Cardiovascular: Normal rate and regular rhythm.  Respiratory: Effort normal and breath sounds normal.  GI: Soft. Bowel sounds are normal. There is no abdominal tenderness.  Musculoskeletal: Normal range of motion.  Neurological: She is alert and oriented to person, place, and time. No cranial nerve deficit.  Reflex Scores:      Patellar reflexes are 2+ on the right side and 2+ on the left side. Skin: Skin is warm and dry.  Psychiatric: She has a normal mood and affect.           Assessment & Plan:

## 2019-05-01 NOTE — Assessment & Plan Note (Signed)
LDL slightly increased, overall about the same. Encouraged a healthy diet with regular exercise. Continue to monitor.

## 2019-05-01 NOTE — Assessment & Plan Note (Signed)
Recent A1C improved from last year, continue to monitor. Discussed the importance of a healthy diet and regular exercise in order for weight loss, and to reduce the risk of any potential medical problems.

## 2019-05-01 NOTE — Assessment & Plan Note (Signed)
Doing well on PRN Zyrtec, refills sent to pharmacy.

## 2019-05-01 NOTE — Patient Instructions (Signed)
Start exercising. You should be getting 150 minutes of moderate intensity exercise weekly.  It's important to improve your diet by reducing consumption of fast food, fried food, processed snack foods, sugary drinks. Increase consumption of fresh vegetables and fruits, whole grains, water.  Ensure you are drinking 64 ounces of water daily.  Call the [facility] to schedule your mammogram.  It was a pleasure to see you today!   Preventive Care 41-41 Years Old, Female Preventive care refers to visits with your health care provider and lifestyle choices that can promote health and wellness. This includes:  A yearly physical exam. This may also be called an annual well check.  Regular dental visits and eye exams.  Immunizations.  Screening for certain conditions.  Healthy lifestyle choices, such as eating a healthy diet, getting regular exercise, not using drugs or products that contain nicotine and tobacco, and limiting alcohol use. What can I expect for my preventive care visit? Physical exam Your health care provider will check your:  Height and weight. This may be used to calculate body mass index (BMI), which tells if you are at a healthy weight.  Heart rate and blood pressure.  Skin for abnormal spots. Counseling Your health care provider may ask you questions about your:  Alcohol, tobacco, and drug use.  Emotional well-being.  Home and relationship well-being.  Sexual activity.  Eating habits.  Work and work Statistician.  Method of birth control.  Menstrual cycle.  Pregnancy history. What immunizations do I need?  Influenza (flu) vaccine  This is recommended every year. Tetanus, diphtheria, and pertussis (Tdap) vaccine  You may need a Td booster every 10 years. Varicella (chickenpox) vaccine  You may need this if you have not been vaccinated. Zoster (shingles) vaccine  You may need this after age 61. Measles, mumps, and rubella (MMR)  vaccine  You may need at least one dose of MMR if you were born in 1957 or later. You may also need a second dose. Pneumococcal conjugate (PCV13) vaccine  You may need this if you have certain conditions and were not previously vaccinated. Pneumococcal polysaccharide (PPSV23) vaccine  You may need one or two doses if you smoke cigarettes or if you have certain conditions. Meningococcal conjugate (MenACWY) vaccine  You may need this if you have certain conditions. Hepatitis A vaccine  You may need this if you have certain conditions or if you travel or work in places where you may be exposed to hepatitis A. Hepatitis B vaccine  You may need this if you have certain conditions or if you travel or work in places where you may be exposed to hepatitis B. Haemophilus influenzae type b (Hib) vaccine  You may need this if you have certain conditions. Human papillomavirus (HPV) vaccine  If recommended by your health care provider, you may need three doses over 6 months. You may receive vaccines as individual doses or as more than one vaccine together in one shot (combination vaccines). Talk with your health care provider about the risks and benefits of combination vaccines. What tests do I need? Blood tests  Lipid and cholesterol levels. These may be checked every 5 years, or more frequently if you are over 55 years old.  Hepatitis C test.  Hepatitis B test. Screening  Lung cancer screening. You may have this screening every year starting at age 41 if you have a 30-pack-year history of smoking and currently smoke or have quit within the past 15 years.  Colorectal cancer  screening. All adults should have this screening starting at age 63 and continuing until age 13. Your health care provider may recommend screening at age 24 if you are at increased risk. You will have tests every 1-10 years, depending on your results and the type of screening test.  Diabetes screening. This is done by  checking your blood sugar (glucose) after you have not eaten for a while (fasting). You may have this done every 1-3 years.  Mammogram. This may be done every 1-2 years. Talk with your health care provider about when you should start having regular mammograms. This may depend on whether you have a family history of breast cancer.  BRCA-related cancer screening. This may be done if you have a family history of breast, ovarian, tubal, or peritoneal cancers.  Pelvic exam and Pap test. This may be done every 3 years starting at age 41. Starting at age 41, this may be done every 5 years if you have a Pap test in combination with an HPV test. Other tests  Sexually transmitted disease (STD) testing.  Bone density scan. This is done to screen for osteoporosis. You may have this scan if you are at high risk for osteoporosis. Follow these instructions at home: Eating and drinking  Eat a diet that includes fresh fruits and vegetables, whole grains, lean protein, and low-fat dairy.  Take vitamin and mineral supplements as recommended by your health care provider.  Do not drink alcohol if: ? Your health care provider tells you not to drink. ? You are pregnant, may be pregnant, or are planning to become pregnant.  If you drink alcohol: ? Limit how much you have to 0-1 drink a day. ? Be aware of how much alcohol is in your drink. In the [place] one drink equals one 12 oz bottle of beer (355 mL), one 5 oz glass of wine (148 mL), or one 1 oz glass of hard liquor (44 mL). Lifestyle  Take daily care of your teeth and gums.  Stay active. Exercise for at least 30 minutes on 5 or more days each week.  Do not use any products that contain nicotine or tobacco, such as cigarettes, e-cigarettes, and chewing tobacco. If you need help quitting, ask your health care provider.  If you are sexually active, practice safe sex. Use a condom or other form of birth control (contraception) in order to prevent pregnancy  and STIs (sexually transmitted infections).  If told by your health care provider, take low-dose aspirin daily starting at age 41. What's next?  Visit your health care provider once a year for a well check visit.  Ask your health care provider how often you should have your eyes and teeth checked.  Stay up to date on all vaccines. This information is not intended to replace advice given to you by your health care provider. Make sure you discuss any questions you have with your health care provider. Document Released: 06/07/2015 Document Revised: 01/20/2018 Document Reviewed: 01/20/2018 Elsevier Patient Education  2020 [employer].

## 2019-05-01 NOTE — Assessment & Plan Note (Signed)
Chronic, unchanged. Chronic use with typing. Discussed options for treatment including Sports Medicine/Ortho referral. She will think about this.

## 2019-07-25 ENCOUNTER — Other Ambulatory Visit: Payer: Self-pay | Admitting: Primary Care

## 2019-07-25 DIAGNOSIS — J3089 Other allergic rhinitis: Secondary | ICD-10-CM

## 2020-07-26 ENCOUNTER — Other Ambulatory Visit: Payer: Self-pay | Admitting: Primary Care

## 2020-07-26 DIAGNOSIS — J3089 Other allergic rhinitis: Secondary | ICD-10-CM

## 2020-08-01 ENCOUNTER — Ambulatory Visit (INDEPENDENT_AMBULATORY_CARE_PROVIDER_SITE_OTHER)
Admission: RE | Admit: 2020-08-01 | Discharge: 2020-08-01 | Disposition: A | Discharge status: Home / Self Care | Payer: BC Managed Care – PPO | Source: Ambulatory Visit | Attending: Primary Care | Admitting: Primary Care

## 2020-08-01 ENCOUNTER — Other Ambulatory Visit: Payer: Self-pay

## 2020-08-01 ENCOUNTER — Other Ambulatory Visit: Payer: Self-pay | Admitting: Primary Care

## 2020-08-01 ENCOUNTER — Ambulatory Visit (INDEPENDENT_AMBULATORY_CARE_PROVIDER_SITE_OTHER): Payer: BC Managed Care – PPO | Admitting: Primary Care

## 2020-08-01 ENCOUNTER — Encounter: Payer: Self-pay | Admitting: Primary Care

## 2020-08-01 VITALS — BP 138/84 | HR 86 | Temp 97.8°F | Ht 65.5 in | Wt 256.0 lb

## 2020-08-01 DIAGNOSIS — G8929 Other chronic pain: Secondary | ICD-10-CM | POA: Diagnosis not present

## 2020-08-01 DIAGNOSIS — M5441 Lumbago with sciatica, right side: Secondary | ICD-10-CM

## 2020-08-01 DIAGNOSIS — J3089 Other allergic rhinitis: Secondary | ICD-10-CM

## 2020-08-01 DIAGNOSIS — M545 Low back pain, unspecified: Secondary | ICD-10-CM | POA: Diagnosis not present

## 2020-08-01 MED ORDER — CYCLOBENZAPRINE HCL 5 MG PO TABS: 5.0000 mg | ORAL_TABLET | Freq: Every evening | ORAL | 0 refills | Status: DC | PRN

## 2020-08-01 MED ORDER — PREDNISONE 20 MG PO TABS: ORAL_TABLET | ORAL | 0 refills | Status: DC

## 2020-08-01 NOTE — Progress Notes (Signed)
Subjective:    Patient ID: Nichole [name], female    DOB: [birth year], 45 y.o.   MRN: [id number]  HPI  Nichole [name] is a very pleasant 45 y.o. female with a history of lumbar radiculopathy, prediabetes, tobacco abuse who presents today with a chief complaint of back pain. She is also needing medication refills.  Her back pain is located to the right lower back with radiation down to her buttocks and right lower extremity to her toes. Chronic right lower back pain for years, more persistent for the last three months. Also with numbness/tingling.   She's been trying to exercise on the elliptical and recumbent bike which doesn't provoke her pain, but she does notice numbness after about 45 minutes.   She's also tried OTC creams, stretching, heating pads, Tylenol, and Aleve, very temporary improvement. Pain is worse when laying flat, has not been able to sleep in her bed for three months, has been sleeping in her recliner. Pain is also worse with sitting, changing positions.   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 She denies changes in bowel/bladder control, groin numbness.   Review of Systems  Genitourinary:       No changes in bowel/bladder control  Musculoskeletal: Positive for back pain.  Neurological: Positive for numbness.         Past Medical History:  Diagnosis Date  . Anemia   . Dyspnea    with exertion  . Pre-diabetes     Social History   Socioeconomic History  . Marital status: Single    Spouse name: Not on file  . Number of children: Not on file  . Years of education: Not on file  . Highest education level: Not on file  Occupational History  . Not on file  Tobacco Use  . Smoking status: Former Smoker    Packs/day: 0.10    Years: 15.00    Pack years: 1.50    Types: Cigarettes    Quit date: 01/23/2017    Years since quitting: 3.5  . Smokeless tobacco: Never Used  Substance and Sexual Activity  . Alcohol use: Yes     Alcohol/week: 0.0 standard drinks    Comment: rarely  . Drug use: No  . Sexual activity: Not Currently    Birth control/protection: None  Other Topics Concern  . 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   Had 41 year old son.   Moved from [place] in Sept. 2015   Social Determinants of Health   Financial Resource Strain: Not on file  Food Insecurity: Not on file  Transportation Needs: Not on file  Physical Activity: Not on file  Stress: Not on file  Social Connections: Not on file  Intimate Partner Violence: Not on file    Past Surgical History:  Procedure Laterality Date  . LAPAROSCOPIC VAGINAL HYSTERECTOMY WITH SALPINGECTOMY N/A 10/08/2016   Procedure: LAPAROSCOPIC ASSISTED VAGINAL HYSTERECTOMY WITH RIGHT SALPINGECTOMY LYSIS OF ADHESIONS;  Surgeon: Donnamae Jude, MD;  Location: [facility];  Service: Gynecology;  Laterality: N/A;  . MYOMECTOMY  2014  . Right Oophrectomy    . WISDOM TOOTH EXTRACTION      Family History  Problem Relation Age of Onset  . Arthritis Mother   . Breast cancer Mother 16       Lumpectomy  . Hyperlipidemia Mother   .  Hypertension Mother   . Kidney cancer Mother 72       Had ablasion  . Graves' disease Mother        Had thyroid removed  . Graves' disease Brother        Deceased    No Known Allergies  Current Outpatient Medications on File Prior to Visit  Medication Sig Dispense Refill  . albuterol (PROVENTIL HFA;VENTOLIN HFA) 108 (90 Base) MCG/ACT inhaler Inhale 1-2 puffs into the lungs every 6 (six) hours as needed for wheezing or shortness of breath. 1 Inhaler 0  . cetirizine (ZYRTEC) 10 MG tablet TAKE 1 TABLET (10 MG TOTAL) BY MOUTH EVERY EVENING. FOR ALLERGIES. 90 tablet 1  . fluticasone (FLONASE) 50 MCG/ACT nasal spray Place 2 sprays into both nostrils daily. 16 g 6   No current facility-administered medications on file prior to visit.    BP 138/84   Pulse 86    Temp 97.8 F (36.6 C) (Temporal)   Ht 5' 5.5" (1.664 m)   Wt 256 lb (116.1 kg)   LMP 09/09/2016 (Approximate)   SpO2 96%   BMI 41.95 kg/m  Objective:   Physical Exam Musculoskeletal:     Lumbar back: Tenderness present. No bony tenderness. Decreased range of motion. Negative right straight leg raise test and negative left straight leg raise test.       Back:     Comments: Moderate decrease in ROM to right lower back with pain. Negative straight leg raise. Difficulty getting up and down from exam table. Limping while ambulating in office.   Skin:    General: Skin is warm and dry.  Neurological:     Mental Status: She is alert.           Assessment & Plan:      This visit occurred during the SARS-CoV-2 public health emergency.  Safety protocols were in place, including screening questions prior to the visit, additional usage of staff PPE, and extensive cleaning of exam room while observing appropriate contact time as indicated for disinfecting solutions.

## 2020-08-01 NOTE — Assessment & Plan Note (Signed)
Chronic for years, persistent x 3 months.  Unable to lay in her own bed for 3 months which is concerning.   No alarm signs on exam today, however, she does appear quite uncomfortable.   Lumbar plain films obtained and pending today. Rx for prednisone course and Flexeril sent to pharmacy. Referral placed to neurosurgery for evaluation.

## 2020-08-01 NOTE — Patient Instructions (Addendum)
Complete xray(s) prior to leaving today. I will notify you of your results once received.  Start prednisone 20 mg tablets for pain.  Take 3 tablets by mouth once daily for 3 days, then 2 tablets daily for 3 days, then 1 tablet daily for 3 days.  You can take the cyclobenzaprine (Flexeril) 5 mg tablets at bedtime as needed for muscle spasms.  This will cause drowsiness  Refrain from using Aleve, Advil/ibuprofen while on prednisone.  You can take Tylenol if needed.  It was a pleasure to see you today!

## 2020-09-04 DIAGNOSIS — M5416 Radiculopathy, lumbar region: Secondary | ICD-10-CM | POA: Diagnosis not present

## 2020-09-04 DIAGNOSIS — M47816 Spondylosis without myelopathy or radiculopathy, lumbar region: Secondary | ICD-10-CM | POA: Diagnosis not present

## 2020-11-21 ENCOUNTER — Encounter: Payer: Self-pay | Admitting: Primary Care

## 2020-11-21 ENCOUNTER — Other Ambulatory Visit: Payer: Self-pay

## 2020-11-21 ENCOUNTER — Ambulatory Visit (INDEPENDENT_AMBULATORY_CARE_PROVIDER_SITE_OTHER): Payer: BC Managed Care – PPO | Admitting: Primary Care

## 2020-11-21 ENCOUNTER — Telehealth: Payer: Self-pay

## 2020-11-21 VITALS — BP 120/60 | HR 82 | Temp 98.4°F | Ht 65.5 in | Wt 262.0 lb

## 2020-11-21 DIAGNOSIS — R7303 Prediabetes: Secondary | ICD-10-CM

## 2020-11-21 DIAGNOSIS — R600 Localized edema: Secondary | ICD-10-CM | POA: Diagnosis not present

## 2020-11-21 DIAGNOSIS — J3089 Other allergic rhinitis: Secondary | ICD-10-CM

## 2020-11-21 DIAGNOSIS — Z114 Encounter for screening for human immunodeficiency virus [HIV]: Secondary | ICD-10-CM | POA: Diagnosis not present

## 2020-11-21 DIAGNOSIS — Z1159 Encounter for screening for other viral diseases: Secondary | ICD-10-CM

## 2020-11-21 DIAGNOSIS — Z6841 Body Mass Index (BMI) 40.0 and over, adult: Secondary | ICD-10-CM

## 2020-11-21 LAB — COMPREHENSIVE METABOLIC PANEL
ALT: 15 U/L (ref 0–35)
AST: 17 U/L (ref 0–37)
Albumin: 4.1 g/dL (ref 3.5–5.2)
Alkaline Phosphatase: 66 U/L (ref 39–117)
BUN: 11 mg/dL (ref 6–23)
CO2: 27 mEq/L (ref 19–32)
Calcium: 9.2 mg/dL (ref 8.4–10.5)
Chloride: 104 mEq/L (ref 96–112)
Creatinine, Ser: 0.94 mg/dL (ref 0.40–1.20)
GFR: 74.42 mL/min (ref >60.00)
Glucose, Bld: 97 mg/dL (ref 70–99)
Potassium: 3.8 mEq/L (ref 3.5–5.1)
Sodium: 138 mEq/L (ref 135–145)
Total Bilirubin: 0.5 mg/dL (ref 0.2–1.2)
Total Protein: 7.5 g/dL (ref 6.0–8.3)

## 2020-11-21 LAB — CBC
HCT: 36.9 % (ref 36.0–46.0)
Hemoglobin: 12.4 g/dL (ref 12.0–15.0)
MCHC: 33.6 g/dL (ref 30.0–36.0)
MCV: 90.5 fl (ref 78.0–100.0)
Platelets: 253 10*3/uL (ref 150.0–400.0)
RBC: 4.08 Mil/uL (ref 3.87–5.11)
RDW: 13.1 % (ref 11.5–15.5)
WBC: 4.1 10*3/uL (ref 4.0–10.5)

## 2020-11-21 LAB — HEMOGLOBIN A1C: Hgb A1c MFr Bld: 6.3 % (ref 4.6–6.5)

## 2020-11-21 LAB — BRAIN NATRIURETIC PEPTIDE: Pro B Natriuretic peptide (BNP): 5 pg/mL (ref 0.0–100.0)

## 2020-11-21 MED ORDER — FLUTICASONE PROPIONATE 50 MCG/ACT NA SUSP: 1.0000 | Freq: Two times a day (BID) | NASAL | 2 refills | Status: DC

## 2020-11-21 NOTE — Telephone Encounter (Signed)
Per chart review pt has already had appt earlier today.

## 2020-11-21 NOTE — Assessment & Plan Note (Signed)
No recent A1C check on file. Repeat labs pending.

## 2020-11-21 NOTE — Telephone Encounter (Signed)
[facility] Night - Client Nonclinical Telephone Record AccessNurse Client [facility] Night - Client Client Site [place] Physician Alma Friendly - NP Contact Type Call Who Is Calling Patient / Member / Family / Caregiver Caller Name [place] Phone Number [phone] Call Type Message Only Information Provided Reason for Call Returning a Call from the Office Initial [place] wants to confirm her appointment for 740am. She wants to make sure someone knows she will be there. Disp. Time Disposition Final User 11/20/2020 5:12:49 PM General Information Provided Yes Orene Desanctis, [employer] Call Closed By: Julien Girt Transaction Date/Time: 11/20/2020 5:10:44 PM (ET)

## 2020-11-21 NOTE — Addendum Note (Signed)
Addended by: Pleas Koch on: 11/21/2020 08:23 AM   Modules accepted: Orders

## 2020-11-21 NOTE — Patient Instructions (Signed)
Stop by the lab prior to leaving today. I will notify you of your results once received.   [facility]   Start exercising. You should be getting 150 minutes of moderate intensity exercise weekly.  Elevated your legs when resting.  Consider compression socks.   It was a pleasure to see you today!

## 2020-11-21 NOTE — Assessment & Plan Note (Signed)
Acute two weeks ago, improving now. Overall no swelling noted to lower extremities. She has put on quite a bit of weight over the years, discussed this today. She is sedentary, suspect dyspnea is secondary to deconditioning.  Will update labs today including A1C, CMP, CBC. Add BNP.

## 2020-11-21 NOTE — Assessment & Plan Note (Signed)
No regular exercise, seems like portion sizes may be problematic. Suspect lower extremity swelling is secondary to weight gain.  She is considering a bariatric program. Provided a few names.

## 2020-11-21 NOTE — Progress Notes (Signed)
Subjective:    Patient ID: Nichole [name], female    DOB: 05/28/1975, 45 y.o.   MRN: [id number]  HPI  Nichole [name] is a very pleasant 45 y.o. female with a history of lumbar radiculopathy, chronic right sided back pain, prediabetes, tobacco abuse, hyperlipidemia who presents today to discuss lower extremity swelling.  She's noticed swelling to her bilateral lower extremities from her toes to her knees which began two weeks ago. She noticed that her shoes were tighter than usual. She did see "pitting to 2+ and 3+".  She is drinking 68-96 oz of water daily. She purchased a "water" pill OTC and took it for 2-3 days, swelling has improved some.   She denies adding salt to her meals. She is cutting back on canned foods, does rinse any canned foods. She is not exercising much given her chronic lower back pain. She sits at a desk for the majority of her work day, does have an under desk bike, uses sometimes. Has noticed exertional shortness of breath when walking to work from the parking lot.   BP Readings from Last 3 Encounters:  11/21/20 120/60  08/01/20 138/84  05/01/19 122/82   Wt Readings from Last 3 Encounters:  11/21/20 262 lb (118.8 kg)  08/01/20 256 lb (116.1 kg)  05/01/19 227 lb (103 kg)      Review of Systems  Constitutional:  Negative for unexpected weight change.  Respiratory:  Positive for shortness of breath. Negative for cough.   Cardiovascular:  Positive for leg swelling. Negative for chest pain.  Skin:  Negative for color change.        Past Medical History:  Diagnosis Date   Anemia    Dyspnea    with exertion   Pre-diabetes     Social History   Socioeconomic History   Marital status: Single    Spouse name: Not on file   Number of children: Not on file   Years of education: Not on file   Highest education level: Not on file  Occupational History   Not on file  Tobacco Use   Smoking status: Former    Packs/day: 0.10    Years: 15.00    Pack years:  1.50    Types: Cigarettes    Quit date: 01/23/2017    Years since quitting: 3.8   Smokeless tobacco: Never  Substance and Sexual Activity   Alcohol use: Yes    Alcohol/week: 0.0 standard drinks    Comment: rarely   Drug use: No   Sexual activity: Not Currently    Birth control/protection: None  Other Topics Concern   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   Had 7 year old son.   Moved from [place] in Sept. 2015   Social Determinants of Health   Financial Resource Strain: Not on file  Food Insecurity: Not on file  Transportation Needs: Not on file  Physical Activity: Not on file  Stress: Not on file  Social Connections: Not on file  Intimate Partner Violence: Not on file    Past Surgical History:  Procedure Laterality Date   LAPAROSCOPIC VAGINAL HYSTERECTOMY WITH SALPINGECTOMY N/A 10/08/2016   Procedure: LAPAROSCOPIC ASSISTED VAGINAL HYSTERECTOMY WITH RIGHT SALPINGECTOMY LYSIS OF ADHESIONS;  Surgeon: Donnamae Jude, MD;  Location: [facility];  Service: Gynecology;  Laterality: N/A;   MYOMECTOMY  2014   Right Oophrectomy  WISDOM TOOTH EXTRACTION      Family History  Problem Relation Age of Onset   Arthritis Mother    Breast cancer Mother 103       Lumpectomy   Hyperlipidemia Mother    Hypertension Mother    Kidney cancer Mother 81       Had ablasion   Graves' disease Mother        Had thyroid removed   Graves' disease Brother        Deceased    No Known Allergies  Current Outpatient Medications on File Prior to Visit  Medication Sig Dispense Refill   albuterol (PROVENTIL HFA;VENTOLIN HFA) 108 (90 Base) MCG/ACT inhaler Inhale 1-2 puffs into the lungs every 6 (six) hours as needed for wheezing or shortness of breath. 1 Inhaler 0   cetirizine (ZYRTEC) 10 MG tablet TAKE 1 TABLET (10 MG TOTAL) BY MOUTH EVERY EVENING. FOR ALLERGIES. NOT COVERED 90 tablet 1   fluticasone (FLONASE) 50 MCG/ACT  nasal spray Place 2 sprays into both nostrils daily. 16 g 6   cyclobenzaprine (FLEXERIL) 5 MG tablet Take 1-2 tablets (5-10 mg total) by mouth at bedtime as needed for muscle spasms. (Patient not taking: Reported on 11/21/2020) 20 tablet 0   No current facility-administered medications on file prior to visit.    BP 120/60   Pulse 82   Temp 98.4 F (36.9 C) (Temporal)   Ht 5' 5.5" (1.664 m)   Wt 262 lb (118.8 kg)   LMP 09/09/2016 (Approximate)   SpO2 97%   BMI 42.94 kg/m  Objective:   Physical Exam Cardiovascular:     Rate and Rhythm: Normal rate and regular rhythm.  Pulmonary:     Effort: Pulmonary effort is normal.     Breath sounds: Normal breath sounds.  Musculoskeletal:     Cervical back: Neck supple.  Skin:    General: Skin is warm and dry.          Assessment & Plan:      This visit occurred during the SARS-CoV-2 public health emergency.  Safety protocols were in place, including screening questions prior to the visit, additional usage of staff PPE, and extensive cleaning of exam room while observing appropriate contact time as indicated for disinfecting solutions.

## 2020-11-22 DIAGNOSIS — M5441 Lumbago with sciatica, right side: Secondary | ICD-10-CM | POA: Diagnosis not present

## 2020-11-22 DIAGNOSIS — M5416 Radiculopathy, lumbar region: Secondary | ICD-10-CM | POA: Diagnosis not present

## 2020-11-22 DIAGNOSIS — M5442 Lumbago with sciatica, left side: Secondary | ICD-10-CM | POA: Diagnosis not present

## 2020-11-22 DIAGNOSIS — G8929 Other chronic pain: Secondary | ICD-10-CM | POA: Diagnosis not present

## 2020-11-22 LAB — HEPATITIS C ANTIBODY
Hepatitis C Ab: NONREACTIVE
SIGNAL TO CUT-OFF: 0.02 (ref <1.00)

## 2020-11-22 LAB — HIV ANTIBODY (ROUTINE TESTING W REFLEX): HIV 1&2 Ab, 4th Generation: NONREACTIVE

## 2020-11-27 ENCOUNTER — Other Ambulatory Visit: Payer: Self-pay | Admitting: Family Medicine

## 2020-11-27 DIAGNOSIS — G8929 Other chronic pain: Secondary | ICD-10-CM

## 2020-11-27 DIAGNOSIS — M5442 Lumbago with sciatica, left side: Secondary | ICD-10-CM

## 2020-12-06 ENCOUNTER — Other Ambulatory Visit: Payer: BC Managed Care – PPO

## 2021-01-23 DIAGNOSIS — Z6841 Body Mass Index (BMI) 40.0 and over, adult: Secondary | ICD-10-CM | POA: Diagnosis not present

## 2021-01-23 DIAGNOSIS — R7303 Prediabetes: Secondary | ICD-10-CM | POA: Diagnosis not present

## 2021-01-28 ENCOUNTER — Other Ambulatory Visit: Payer: Self-pay | Admitting: Surgery

## 2021-01-29 ENCOUNTER — Other Ambulatory Visit (HOSPITAL_COMMUNITY): Payer: Self-pay | Admitting: Surgery

## 2021-02-01 DIAGNOSIS — F5089 Other specified eating disorder: Secondary | ICD-10-CM | POA: Diagnosis not present

## 2021-02-03 DIAGNOSIS — F5089 Other specified eating disorder: Secondary | ICD-10-CM | POA: Diagnosis not present

## 2021-02-06 ENCOUNTER — Ambulatory Visit (HOSPITAL_COMMUNITY)
Admission: RE | Admit: 2021-02-06 | Discharge: 2021-02-06 | Disposition: A | Discharge status: Home / Self Care | Payer: BC Managed Care – PPO | Source: Ambulatory Visit | Attending: Surgery | Admitting: Surgery

## 2021-02-06 ENCOUNTER — Other Ambulatory Visit: Payer: Self-pay

## 2021-02-06 DIAGNOSIS — Z01818 Encounter for other preprocedural examination: Secondary | ICD-10-CM | POA: Diagnosis not present

## 2021-02-11 ENCOUNTER — Ambulatory Visit: Payer: Self-pay | Admitting: Surgery

## 2021-02-19 DIAGNOSIS — Z20822 Contact with and (suspected) exposure to covid-19: Secondary | ICD-10-CM | POA: Diagnosis not present

## 2021-02-21 ENCOUNTER — Encounter (HOSPITAL_COMMUNITY): Payer: Self-pay | Admitting: Surgery

## 2021-02-27 ENCOUNTER — Other Ambulatory Visit: Payer: Self-pay | Admitting: Primary Care

## 2021-02-27 DIAGNOSIS — J3089 Other allergic rhinitis: Secondary | ICD-10-CM

## 2021-02-28 ENCOUNTER — Telehealth (INDEPENDENT_AMBULATORY_CARE_PROVIDER_SITE_OTHER): Payer: BC Managed Care – PPO | Admitting: Nurse Practitioner

## 2021-02-28 ENCOUNTER — Other Ambulatory Visit: Payer: Self-pay

## 2021-02-28 VITALS — Temp 97.6°F

## 2021-02-28 DIAGNOSIS — J4541 Moderate persistent asthma with (acute) exacerbation: Secondary | ICD-10-CM | POA: Diagnosis not present

## 2021-02-28 DIAGNOSIS — J45909 Unspecified asthma, uncomplicated: Secondary | ICD-10-CM | POA: Insufficient documentation

## 2021-02-28 MED ORDER — ALBUTEROL SULFATE HFA 108 (90 BASE) MCG/ACT IN AERS: 1.0000 | INHALATION_SPRAY | Freq: Four times a day (QID) | RESPIRATORY_TRACT | 0 refills | Status: DC | PRN

## 2021-02-28 MED ORDER — IPRATROPIUM-ALBUTEROL 0.5-2.5 (3) MG/3ML IN SOLN: 3.0000 mL | Freq: Four times a day (QID) | RESPIRATORY_TRACT | 0 refills | Status: DC | PRN

## 2021-02-28 MED ORDER — ALBUTEROL SULFATE (2.5 MG/3ML) 0.083% IN NEBU: 2.5000 mg | INHALATION_SOLUTION | Freq: Four times a day (QID) | RESPIRATORY_TRACT | 0 refills | Status: DC | PRN

## 2021-02-28 MED ORDER — PREDNISONE 20 MG PO TABS: ORAL_TABLET | ORAL | 0 refills | Status: AC

## 2021-02-28 NOTE — Progress Notes (Signed)
Patient ID: Nichole [name], female    DOB: September 08, 1975, 45 y.o.   MRN: [id number]  Virtual visit completed through [employer], a video enabled telemedicine application. Due to national recommendations of social distancing due to COVID-19, a virtual visit is felt to be most appropriate for this patient at this time. Reviewed limitations, risks, security and privacy concerns of performing a virtual visit and the availability of in person appointments. I also reviewed that there may be a patient responsible charge related to this service. The patient agreed to proceed.   Patient location: Work Provider location: Financial controller at [employer], office Persons participating in this virtual visit: patient, provider   If any vitals were documented, they were collected by patient at home unless specified below.    Temp 97.6 F (36.4 C) Comment: per patient  LMP 09/09/2016 (Approximate)   SpO2 97%    CC: Wheezing and coughing Subjective:   HPI: Nichole [name] is a 45 y.o. female presenting on 02/28/2021 for Shortness of Breath (Started about over week ago. Wheezing, cough, head congestion, chest congestion. Flu and Covid tests last week were negative. Would like to have Duoneb refilled, using inhaler more often due to symtoms)    Symptoms started about 1 weeks. Covid and flu negative last Tuesday or Wednesday Has been using her inhaler with minimal relief Besides the cough wheezing and chest tightness her other symptoms have improved.  She did request a refill on her nebulizer treatments, has machine at home.     Relevant past medical, surgical, family and social history reviewed and updated as indicated. Interim medical history since our last visit reviewed. Allergies and medications reviewed and updated. Outpatient Medications Prior to Visit  Medication Sig Dispense Refill   albuterol (PROVENTIL HFA;VENTOLIN HFA) 108 (90 Base) MCG/ACT inhaler Inhale 1-2 puffs into the lungs every 6 (six) hours  as needed for wheezing or shortness of breath. 1 Inhaler 0   cetirizine (ZYRTEC) 10 MG tablet TAKE 1 TABLET (10 MG TOTAL) BY MOUTH EVERY EVENING. FOR ALLERGIES. NOT COVERED (Patient taking differently: Take 10 mg by mouth daily.) 90 tablet 1   fluticasone (FLONASE) 50 MCG/ACT nasal spray Place 1 spray into both nostrils 2 (two) times daily as needed for allergies or rhinitis. 48 mL 0   cyclobenzaprine (FLEXERIL) 5 MG tablet Take 1-2 tablets (5-10 mg total) by mouth at bedtime as needed for muscle spasms. (Patient not taking: No sig reported) 20 tablet 0   No facility-administered medications prior to visit.     Per HPI unless specifically indicated in ROS section below Review of Systems  Constitutional:  Negative for chills and fever.  HENT:  Negative for sore throat.   Respiratory:  Positive for cough, shortness of breath and wheezing.   Cardiovascular:  Negative for chest pain.  Gastrointestinal:  Negative for constipation, nausea and vomiting.  Objective:  Temp 97.6 F (36.4 C) Comment: per patient  LMP 09/09/2016 (Approximate)   SpO2 97%   Wt Readings from Last 3 Encounters:  11/21/20 262 lb (118.8 kg)  08/01/20 256 lb (116.1 kg)  05/01/19 227 lb (103 kg)       Physical exam: Gen: alert, NAD, not ill appearing Pulm: speaks in complete sentences without increased work of breathing Psych: normal mood, normal thought content      Results for orders placed or performed in visit on 11/21/20  HIV Antibody (routine testing w rflx)  Result Value Ref Range   HIV 1&2 Ab, 4th Generation NON-REACTIVE NON-REACTIVE  Hepatitis C antibody  Result Value Ref Range   Hepatitis C Ab NON-REACTIVE NON-REACTIVE   SIGNAL TO CUT-OFF 0.02 <1.00  CBC  Result Value Ref Range   WBC 4.1 4.0 - 10.5 K/uL   RBC 4.08 3.87 - 5.11 Mil/uL   Platelets 253.0 150.0 - 400.0 K/uL   Hemoglobin 12.4 12.0 - 15.0 g/dL   HCT 36.9 36.0 - 46.0 %   MCV 90.5 78.0 - 100.0 fl   MCHC 33.6 30.0 - 36.0 g/dL   RDW 13.1  11.5 - 15.5 %  Comprehensive metabolic panel  Result Value Ref Range   Sodium 138 135 - 145 mEq/L   Potassium 3.8 3.5 - 5.1 mEq/L   Chloride 104 96 - 112 mEq/L   CO2 27 19 - 32 mEq/L   Glucose, Bld 97 70 - 99 mg/dL   BUN 11 6 - 23 mg/dL   Creatinine, Ser 0.94 0.40 - 1.20 mg/dL   Total Bilirubin 0.5 0.2 - 1.2 mg/dL   Alkaline Phosphatase 66 39 - 117 U/L   AST 17 0 - 37 U/L   ALT 15 0 - 35 U/L   Total Protein 7.5 6.0 - 8.3 g/dL   Albumin 4.1 3.5 - 5.2 g/dL   GFR 74.42 >60.00 mL/min   Calcium 9.2 8.4 - 10.5 mg/dL  Hemoglobin A1c  Result Value Ref Range   Hgb A1c MFr Bld 6.3 4.6 - 6.5 %  Brain natriuretic peptide  Result Value Ref Range   Pro B Natriuretic peptide (BNP) 5.0 0.0 - 100.0 pg/mL   Assessment & Plan:   Problem List Items Addressed This Visit       Respiratory   Moderate persistent asthma with exacerbation - Primary    After talking with patient seems like patient is having an asthma exacerbation.  Most of her symptoms resolved except for chest tightness and wheezing.  Patient has been using her albuterol inhaler and increased amount.  She did request DuoNeb refills we will send in a steroid taper over the next week and refill her albuterol inhaler.  Patient was instructed if this does not help she is to be evaluated in person gave signs and symptoms as when to be seen in urgent care or emergency department patient is able to monitor her O2 sats at home.  She should continue to do such      Relevant Medications   predniSONE (DELTASONE) 20 MG tablet   albuterol (VENTOLIN HFA) 108 (90 Base) MCG/ACT inhaler     No orders of the defined types were placed in this encounter.  No orders of the defined types were placed in this encounter.   I discussed the assessment and treatment plan with the patient. The patient was provided an opportunity to ask questions and all were answered. The patient agreed with the plan and demonstrated an understanding of the instructions. The  patient was advised to call back or seek an in-person evaluation if the symptoms worsen or if the condition fails to improve as anticipated.  Follow up plan: No follow-ups on file.  Romilda Garret, NP

## 2021-02-28 NOTE — Assessment & Plan Note (Signed)
After talking with patient seems like patient is having an asthma exacerbation.  Most of her symptoms resolved except for chest tightness and wheezing.  Patient has been using her albuterol inhaler and increased amount.  She did request DuoNeb refills we will send in a steroid taper over the next week and refill her albuterol inhaler.  Patient was instructed if this does not help she is to be evaluated in person gave signs and symptoms as when to be seen in urgent care or emergency department patient is able to monitor her O2 sats at home.  She should continue to do such

## 2021-02-28 NOTE — Telephone Encounter (Signed)
Called patient added on to your 2:40.

## 2021-02-28 NOTE — Addendum Note (Signed)
Addended by: Michela Pitcher on: 02/28/2021 04:49 PM   Modules accepted: Orders

## 2021-03-04 ENCOUNTER — Ambulatory Visit (HOSPITAL_COMMUNITY): Payer: BC Managed Care – PPO | Admitting: Certified Registered Nurse Anesthetist

## 2021-03-04 ENCOUNTER — Encounter (HOSPITAL_COMMUNITY)
Admission: RE | Disposition: A | Discharge status: Home / Self Care | Payer: Self-pay | Source: Ambulatory Visit | Attending: Surgery

## 2021-03-04 ENCOUNTER — Other Ambulatory Visit: Payer: Self-pay

## 2021-03-04 ENCOUNTER — Ambulatory Visit (HOSPITAL_COMMUNITY)
Admission: RE | Admit: 2021-03-04 | Discharge: 2021-03-04 | Disposition: A | Discharge status: Home / Self Care | Payer: BC Managed Care – PPO | Source: Ambulatory Visit | Attending: Surgery | Admitting: Surgery

## 2021-03-04 ENCOUNTER — Encounter (HOSPITAL_COMMUNITY): Payer: Self-pay | Admitting: Surgery

## 2021-03-04 DIAGNOSIS — K219 Gastro-esophageal reflux disease without esophagitis: Secondary | ICD-10-CM | POA: Insufficient documentation

## 2021-03-04 DIAGNOSIS — Z6841 Body Mass Index (BMI) 40.0 and over, adult: Secondary | ICD-10-CM | POA: Diagnosis not present

## 2021-03-04 DIAGNOSIS — K2960 Other gastritis without bleeding: Secondary | ICD-10-CM | POA: Insufficient documentation

## 2021-03-04 DIAGNOSIS — Z87891 Personal history of nicotine dependence: Secondary | ICD-10-CM | POA: Insufficient documentation

## 2021-03-04 DIAGNOSIS — M5416 Radiculopathy, lumbar region: Secondary | ICD-10-CM | POA: Diagnosis not present

## 2021-03-04 DIAGNOSIS — R7303 Prediabetes: Secondary | ICD-10-CM | POA: Insufficient documentation

## 2021-03-04 DIAGNOSIS — E785 Hyperlipidemia, unspecified: Secondary | ICD-10-CM | POA: Diagnosis not present

## 2021-03-04 DIAGNOSIS — K2970 Gastritis, unspecified, without bleeding: Secondary | ICD-10-CM | POA: Diagnosis not present

## 2021-03-04 DIAGNOSIS — B9681 Helicobacter pylori [H. pylori] as the cause of diseases classified elsewhere: Secondary | ICD-10-CM | POA: Insufficient documentation

## 2021-03-04 DIAGNOSIS — Z01818 Encounter for other preprocedural examination: Secondary | ICD-10-CM | POA: Diagnosis not present

## 2021-03-04 DIAGNOSIS — Z79899 Other long term (current) drug therapy: Secondary | ICD-10-CM | POA: Diagnosis not present

## 2021-03-04 DIAGNOSIS — Z13810 Encounter for screening for upper gastrointestinal disorder: Secondary | ICD-10-CM | POA: Diagnosis not present

## 2021-03-04 HISTORY — PX: ESOPHAGOGASTRODUODENOSCOPY: SHX5428

## 2021-03-04 HISTORY — PX: BIOPSY: SHX5522

## 2021-03-04 SURGERY — EGD (ESOPHAGOGASTRODUODENOSCOPY)
Anesthesia: Monitor Anesthesia Care

## 2021-03-04 MED ORDER — PROPOFOL 500 MG/50ML IV EMUL
INTRAVENOUS | Status: AC
  Filled 2021-03-04: qty 50

## 2021-03-04 MED ORDER — LACTATED RINGERS IV SOLN: INTRAVENOUS | Status: DC | PRN

## 2021-03-04 MED ORDER — PROPOFOL 10 MG/ML IV BOLUS
INTRAVENOUS | Status: DC | PRN
  Administered 2021-03-04 (×2): 20 mg via INTRAVENOUS
  Administered 2021-03-04: 30 mg via INTRAVENOUS

## 2021-03-04 MED ORDER — PROPOFOL 500 MG/50ML IV EMUL
INTRAVENOUS | Status: DC | PRN
  Administered 2021-03-04: 125 ug/kg/min via INTRAVENOUS

## 2021-03-04 MED ORDER — SODIUM CHLORIDE 0.9 % IV SOLN: INTRAVENOUS | Status: DC | PRN

## 2021-03-04 MED ORDER — PROPOFOL 1000 MG/100ML IV EMUL
INTRAVENOUS | Status: AC
  Filled 2021-03-04: qty 100

## 2021-03-04 MED ORDER — PROPOFOL 10 MG/ML IV BOLUS
INTRAVENOUS | Status: AC
  Filled 2021-03-04: qty 20

## 2021-03-04 MED ORDER — LIDOCAINE 2% (20 MG/ML) 5 ML SYRINGE
INTRAMUSCULAR | Status: DC | PRN
  Administered 2021-03-04: 100 mg via INTRAVENOUS

## 2021-03-04 NOTE — Anesthesia Preprocedure Evaluation (Addendum)
Anesthesia Evaluation  Patient identified by MRN, date of birth, ID band Patient awake    Reviewed: Allergy & Precautions, NPO status , Patient's Chart, lab work & pertinent test results  History of Anesthesia Complications Negative for: history of anesthetic complications  Airway Mallampati: II  TM Distance: >3 FB Neck ROM: Full    Dental  (+) Dental Advisory Given   Pulmonary COPD,  COPD inhaler, former smoker,    breath sounds clear to auscultation       Cardiovascular negative cardio ROS   Rhythm:Regular Rate:Normal     Neuro/Psych Chronic back pain    GI/Hepatic Neg liver ROS, GERD  Controlled,  Endo/Other  Morbid obesityobese  Renal/GU negative Renal ROS     Musculoskeletal   Abdominal (+) + obese,   Peds  Hematology negative hematology ROS (+)   Anesthesia Other Findings   Reproductive/Obstetrics S/p hysterectomy                            Anesthesia Physical Anesthesia Plan  ASA: 2  Anesthesia Plan: MAC   Post-op Pain Management:    Induction:   PONV Risk Score and Plan: 2 and Ondansetron and Treatment may vary due to age or medical condition  Airway Management Planned: Natural Airway and Nasal Cannula  Additional Equipment: None  Intra-op Plan:   Post-operative Plan:   Informed Consent: I have reviewed the patients History and Physical, chart, labs and discussed the procedure including the risks, benefits and alternatives for the proposed anesthesia with the patient or authorized representative who has indicated his/her understanding and acceptance.     Dental advisory given  Plan Discussed with: CRNA and Surgeon  Anesthesia Plan Comments:       Anesthesia Quick Evaluation

## 2021-03-04 NOTE — Op Note (Signed)
[facility] Patient Name: Nichole [name] Procedure Date: 03/04/2021 MRN: [id number] Attending MD: Felicie Morn ,  Date of Birth: July 30, 1975 CSN: [id number] Age: 45 Admit Type: Outpatient Procedure:                Upper GI endoscopy Indications:              Screening procedure, Morbid obesity Providers:                Felicie Morn, Particia Nearing, RN, Cherylynn Ridges, Technician, Luan Moore, Technician,                            Adair Laundry, CRNA Referring MD:              Medicines:                Monitored Anesthesia Care Complications:            No immediate complications. Estimated Blood Loss:     Estimated blood loss was minimal. Procedure:                Pre-Anesthesia Assessment:                           - Prior to the procedure, a History and Physical                            was performed, and patient medications and                            allergies were reviewed. The patient is competent.                            The risks and benefits of the procedure and the                            sedation options and risks were discussed with the                            patient. All questions were answered and informed                            consent was obtained. Patient identification and                            proposed procedure were verified in the                            pre-procedure area. Mental Status Examination:                            alert and oriented. ASA Grade Assessment: II - A  patient with mild systemic disease. After reviewing                            the risks and benefits, the patient was deemed in                            satisfactory condition to undergo the procedure.                            The anesthesia plan was to use monitored anesthesia                            care (MAC). Immediately prior to administration of                             medications, the patient was re-assessed for                            adequacy to receive sedatives. The heart rate,                            respiratory rate, oxygen saturations, blood                            pressure, adequacy of pulmonary ventilation, and                            response to care were monitored throughout the                            procedure. The physical status of the patient was                            re-assessed after the procedure.                           After obtaining informed consent, the endoscope was                            passed under direct vision. Throughout the                            procedure, the patient's blood pressure, pulse, and                            oxygen saturations were monitored continuously. The                            [id number]-H190 ([id number] Olympus endoscope was introduced                            through the mouth, and advanced to the second part  of duodenum. The upper GI endoscopy was                            accomplished without difficulty. The patient                            tolerated the procedure well. Scope In: Scope Out: Findings:      The examined esophagus was normal.      The entire examined stomach was normal. Biopsies were taken with a cold       forceps for Helicobacter pylori testing. Verification of patient       identification for the specimen was done. Estimated blood loss was       minimal.      The in the duodenum was normal.      The gastroesophageal flap valve was visualized endoscopically and       classified as Hill Grade I (prominent fold, tight to endoscope). Impression:               - Normal esophagus.                           - Normal stomach. Biopsied.                           - Normal.                           - Gastroesophageal flap valve classified as Hill                            Grade I (prominent fold, tight to  endoscope). Moderate Sedation:      Moderate (conscious) sedation was personally administered by an       anesthesia professional. The following parameters were monitored: oxygen       saturation, heart rate, blood pressure, and response to care. Total       physician intraservice time was 15 minutes. Recommendation:           - Discharge patient to home.                           - Resume previous diet.                           - Continue present medications.                           - Await pathology results. Procedure Code(s):        --- Professional ---                           [phone], Esophagogastroduodenoscopy, flexible,                            transoral; with biopsy, single or multiple Diagnosis Code(s):        --- Professional ---                           T62.563, Encounter  for screening for upper                            gastrointestinal disorder                           E66.01, Morbid (severe) obesity due to excess                            calories CPT copyright 2019 [employer]. All rights reserved. The codes documented in this report are preliminary and upon coder review may  be revised to meet current compliance requirements. McKenzie,  03/04/2021 1:34:21 PM This report has been signed electronically. Number of Addenda: 0

## 2021-03-04 NOTE — Transfer of Care (Signed)
Immediate Anesthesia Transfer of Care Note  Patient: Nichole [name]  Procedure(s) Performed: ESOPHAGOGASTRODUODENOSCOPY (EGD) BIOPSY  Patient Location: Endoscopy Unit  Anesthesia Type:MAC  Level of Consciousness: drowsy and patient cooperative  Airway & Oxygen Therapy: Patient Spontanous Breathing and Patient connected to face mask oxygen  Post-op Assessment: Report given to RN and Post -op Vital signs reviewed and stable  Post vital signs: Reviewed and stable  Last Vitals:  Vitals Value Taken Time  BP 130/81 03/04/21 1332  Temp    Pulse 72 03/04/21 1333  Resp 20 03/04/21 1333  SpO2 100 % 03/04/21 1333  Vitals shown include unvalidated device data.  Last Pain:  Vitals:   03/04/21 1240  TempSrc: Oral  PainSc: 0-No pain         Complications: No notable events documented.

## 2021-03-04 NOTE — H&P (Signed)
Admitting Physician: Nickola Major Raechelle Sarti  Service: Bariatric surgery  CC: Obesity  Subjective   HPI: Nichole [name] is an 45 y.o. female who is here for elective EGD prior to bariatric surgery  Past Medical History:  Diagnosis Date   Anemia    Dyspnea    with exertion   Pre-diabetes     Past Surgical History:  Procedure Laterality Date   LAPAROSCOPIC VAGINAL HYSTERECTOMY WITH SALPINGECTOMY N/A 10/08/2016   Procedure: LAPAROSCOPIC ASSISTED VAGINAL HYSTERECTOMY WITH RIGHT SALPINGECTOMY LYSIS OF ADHESIONS;  Surgeon: Donnamae Jude, MD;  Location: [facility];  Service: Gynecology;  Laterality: N/A;   MYOMECTOMY  2014   Right Oophrectomy     WISDOM TOOTH EXTRACTION      Family History  Problem Relation Age of Onset   Arthritis Mother    Breast cancer Mother 72       Lumpectomy   Hyperlipidemia Mother    Hypertension Mother    Kidney cancer Mother 71       Had ablasion   Graves' disease Mother        Had thyroid removed   Graves' disease Brother        Deceased    Social:  reports that she quit smoking about 4 years ago. Her smoking use included cigarettes. She has a 1.50 pack-year smoking history. She has never used smokeless tobacco. She reports current alcohol use. She reports that she does not use drugs.  Allergies: No Known Allergies  Medications: Current Outpatient Medications  Medication Instructions   albuterol (PROVENTIL) 2.5 mg, Nebulization, Every 6 hours PRN, If the inhaler is too hard to use can use this instead   albuterol (VENTOLIN HFA) 108 (90 Base) MCG/ACT inhaler 1-2 puffs, Inhalation, Every 6 hours PRN   cetirizine (ZYRTEC) 10 MG tablet TAKE 1 TABLET (10 MG TOTAL) BY MOUTH EVERY EVENING. FOR ALLERGIES. NOT COVERED   fluticasone (FLONASE) 50 MCG/ACT nasal spray 1 spray, Each Nare, 2 times daily PRN   predniSONE (DELTASONE) 20 MG tablet Take 1 tablet (20 mg total) by mouth 2 (two) times daily with a meal for 4 days, THEN 1 tablet (20 mg total) daily  with breakfast for 4 days.    ROS - all of the below systems have been reviewed with the patient and positives are indicated with bold text General: chills, fever or night sweats Eyes: blurry vision or double vision ENT: epistaxis or sore throat Allergy/Immunology: itchy/watery eyes or nasal congestion Hematologic/Lymphatic: bleeding problems, blood clots or swollen lymph nodes Endocrine: temperature intolerance or unexpected weight changes Breast: new or changing breast lumps or nipple discharge Resp: cough, shortness of breath, or wheezing CV: chest pain or dyspnea on exertion GI: as per HPI GU: dysuria, trouble voiding, or hematuria MSK: joint pain or joint stiffness Neuro: TIA or stroke symptoms Derm: pruritus and skin lesion changes Psych: anxiety and depression  Objective   PE Last menstrual period 09/09/2016. Constitutional: NAD; conversant; no deformities Eyes: Moist conjunctiva; no lid lag; anicteric; PERRL Neck: Trachea midline; no thyromegaly Lungs: Normal respiratory effort; no tactile fremitus CV: RRR; no palpable thrills; no pitting edema GI: Abd soft, nontender; no palpable hepatosplenomegaly MSK: Normal range of motion of extremities; no clubbing/cyanosis Psychiatric: Appropriate affect; alert and oriented x3 Lymphatic: No palpable cervical or axillary lymphadenopathy  No results found for this or any previous visit (from the past 24 hour(s)).  Imaging Orders  No imaging studies ordered today     Assessment and Plan  [name] is a 45 year old female who presented to discuss the surgical options to treat obesity and its associated comorbidity. The patient has morbid obesity with a BMI of Body mass index is 43.49 kg/m. and the following conditions related to obesity: prediabetes.  After discussing the available procedures in the region, we discussed in great detail the surgeries I offer: robotic sleeve gastrectomy and robotic roux-en-y gastric bypass.  We discussed the procedures themselves as well as their risks, benefits and alternatives. I entered the patient's basic information into the [facility] Metabolic Surgery Risk/Benefit Calculator to facilitate this discussion.   After a full discussion and all questions answered, the patient is interested in pursuing a sleeve gastrectomy  The preoperative pathway includes the following: - Bloodwork, notable for HgA1c 6.7 - Dietician consult - Chest x-ray completed 02/06/21 - no active cardiopulmonary disease - EKG completed 02/06/21 - NSR - Psychology evaluation -She scored a 1 on the STOP-BANG questionnaire so we will not get a sleep study.  Today she presents for upper endoscopy with biopsy. I explained my rational for performing upper endoscopy in my bariatric patients. During the procedure I will biopsy for H. Pylori, evaluate for hiatal hernia, evaluate for reflux esophagitis and look for any other abnormalities that may influence the procedure. We discussed the risks, benefits and alternative to this procedure and the patient granted consent to proceed.  We will proceed as scheduled.   Felicie Morn, MD  [facility] Use [email or web address] to contact on call provider

## 2021-03-04 NOTE — Anesthesia Postprocedure Evaluation (Signed)
Anesthesia Post Note  Patient: Advertising account executive  Procedure(s) Performed: ESOPHAGOGASTRODUODENOSCOPY (EGD) BIOPSY     Patient location during evaluation: Endoscopy Anesthesia Type: MAC Level of consciousness: awake and alert, patient cooperative and oriented Pain management: pain level controlled Vital Signs Assessment: post-procedure vital signs reviewed and stable Respiratory status: spontaneous breathing, nonlabored ventilation and respiratory function stable Cardiovascular status: blood pressure returned to baseline and stable Postop Assessment: able to ambulate and no apparent nausea or vomiting Anesthetic complications: no   No notable events documented.  Last Vitals:  Vitals:   03/04/21 1340 03/04/21 1350  BP: 129/78 (!) 145/85  Pulse: 87 84  Resp: (!) 22 17  Temp:    SpO2: 97% 98%    Last Pain:  Vitals:   03/04/21 1350  TempSrc:   PainSc: 0-No pain                 Dandra Velardi,E. Laquinda Moller

## 2021-03-05 LAB — SURGICAL PATHOLOGY

## 2021-03-06 ENCOUNTER — Encounter (HOSPITAL_COMMUNITY): Payer: Self-pay | Admitting: Surgery

## 2021-03-11 ENCOUNTER — Other Ambulatory Visit: Payer: Self-pay

## 2021-03-11 ENCOUNTER — Encounter: Payer: BC Managed Care – PPO | Attending: Surgery | Admitting: Skilled Nursing Facility1

## 2021-03-11 ENCOUNTER — Encounter: Payer: Self-pay | Admitting: Skilled Nursing Facility1

## 2021-03-11 DIAGNOSIS — Z6841 Body Mass Index (BMI) 40.0 and over, adult: Secondary | ICD-10-CM | POA: Diagnosis not present

## 2021-03-11 NOTE — Progress Notes (Signed)
Nutrition Assessment for Bariatric Surgery Medical Nutrition Therapy Appt Start Time: 8:08    End Time: 9:05  Patient was seen on 03/11/2021 for Pre-Operative Nutrition Assessment. Letter of approval faxed to [facility] bariatric surgery program coordinator on 03/11/2021.   Referral stated Supervised Weight Loss (SWL) visits needed: 0  Pt completed visits.   Pt has cleared nutrition requirements.    Planned surgery: Sleeve Gastrectomy  Pt expectation of surgery: to lose weight Pt expectation of dietitian: none identified     NUTRITION ASSESSMENT   Anthropometrics  Start weight at [facility]: 262.2 lbs (date: 03/11/2021)  Height: 65.5 in BMI: 42.97 kg/m2     Clinical  Medical hx: prediabetes, Asthma  Medications: see list  Labs: A1C 6.0 Notable signs/symptoms: some back pain Any previous deficiencies? No  Micronutrient Nutrition Focused Physical Exam: Hair: No issues observed Eyes: No issues observed Mouth: No issues observed Neck: No issues observed Nails: No issues observed Skin: No issues observed  Lifestyle & Dietary Hx  Pt states she bought a  juicer because she recognizes she does not eat enough fruit or vegetables: Dietitian educated pt on this topic. Pt states she is trying to get her 50 year old son to eat vegetables too.     24-Hr Dietary Recall First Meal: skipped Snack:  Second Meal 12-1: [employer] or stewed beef + carrots  + potatoes + green beans + rise Snack: fruit Third Meal: cold cuts Snack: pork skins Beverages: water + flavoring, soda, coffee + splenda    Estimated Energy Needs Calories: 1600   NUTRITION DIAGNOSIS  Overweight/obesity (Mission-3.3) related to past poor dietary habits and physical inactivity as evidenced by patient w/ planned sleeve gastrectomy surgery following dietary guidelines for continued weight loss.    NUTRITION INTERVENTION  Nutrition counseling (C-1) and education (E-2) to facilitate bariatric surgery  goals.  Educated pt on micronutrient deficiencies post surgery and strategies to mitigate that risk   Pre-Op Goals Reviewed with the Patient Track food and beverage intake (pen and paper, MyFitness Pal, Baritastic app, etc.) Make healthy food choices while monitoring portion sizes Consume 3 meals per day or try to eat every 3-5 hours Avoid concentrated sugars and fried foods Keep sugar & fat in the single digits per serving on food labels Practice CHEWING your food (aim for applesauce consistency) Practice not drinking 15 minutes before, during, and 30 minutes after each meal and snack Avoid all carbonated beverages (ex: soda, sparkling beverages)  Limit caffeinated beverages (ex: coffee, tea, energy drinks) Avoid all sugar-sweetened beverages (ex: regular soda, sports drinks)  Avoid alcohol  Aim for 64-100 ounces of FLUID daily (with at least half of fluid intake being plain water)  Aim for at least 60-80 grams of PROTEIN daily Look for a liquid protein source that contains ?15 g protein and ?5 g carbohydrate (ex: shakes, drinks, shots) Make a list of non-food related activities Physical activity is an important part of a healthy lifestyle so keep it moving! The goal is to reach 150 minutes of exercise per week, including cardiovascular and weight baring activity.  *Goals that are bolded indicate the pt would like to start working towards these  Handouts Provided Include  Bariatric Surgery handouts (Nutrition Visits, Pre-Op Goals, Protein Shakes, Vitamins & Minerals)  Learning Style & Readiness for Change Teaching method utilized: Visual & Auditory  Demonstrated degree of understanding via: Teach Back  Readiness Level: Action  Barriers to learning/adherence to lifestyle change: un-identified      MONITORING &  EVALUATION Dietary intake, weekly physical activity, body weight, and pre-op goals reached at next nutrition visit.    Next Steps  Patient is to follow up at [facility] for  Pre-Op Class >2 weeks before surgery for further nutrition education.  Pt has completed visits. No further supervised visits required/recomended

## 2021-04-04 ENCOUNTER — Ambulatory Visit: Payer: Self-pay | Admitting: Surgery

## 2021-04-07 ENCOUNTER — Encounter: Payer: BC Managed Care – PPO | Attending: Surgery | Admitting: Skilled Nursing Facility1

## 2021-04-07 ENCOUNTER — Other Ambulatory Visit: Payer: Self-pay

## 2021-04-07 DIAGNOSIS — Z6841 Body Mass Index (BMI) 40.0 and over, adult: Secondary | ICD-10-CM | POA: Insufficient documentation

## 2021-04-07 NOTE — Progress Notes (Signed)
Pre-Operative Nutrition Class:    Patient was seen on 04/07/2021 for Pre-Operative Bariatric Surgery Education at [facility].    Surgery date:  Surgery type: sleeve Start weight at [facility]: 262 Weight today: 263  Samples given per MNT protocol. Patient educated on appropriate usage: Ensure max exp: July 23, 2021 Ensure max lot: [id number]  Chewable bariatric advantage: advanced multi EA exp: 08/23 Chewable bariatric advantage: advanced multi EA lot: [id number]  Bariatric advantage calcium citrate exp: 02/23 Bariatric advantage calcium citrate lot: [id number]   The following the learning objectives were met by the patient during this course: Identify Pre-Op Dietary Goals and will begin 2 weeks pre-operatively Identify appropriate sources of fluids and proteins  State protein recommendations and appropriate sources pre and post-operatively Identify Post-Operative Dietary Goals and will follow for 2 weeks post-operatively Identify appropriate multivitamin and calcium sources Describe the need for physical activity post-operatively and will follow MD recommendations State when to call healthcare provider regarding medication questions or post-operative complications When having a diagnosis of diabetes understanding hypoglycemia symptoms and the inclusion of 1 complex carbohydrate per meal  Handouts given during class include: Pre-Op Bariatric Surgery Diet Handout Protein Shake Handout Post-Op Bariatric Surgery Nutrition Handout BELT Program Information Flyer Support Group Information Flyer [facility] Bariatric Supplements Price List  Follow-Up Plan: Patient will follow-up at [facility] 2 weeks post operatively for diet advancement per MD.

## 2021-04-21 NOTE — Progress Notes (Addendum)
Anesthesia Review:  PCP: Alma Friendly at [facility]  Cardiologist : none  Chest x-ray :02/06/21  EKG :02/06/21  Echo : Stress test: Cardiac Cath :  Activity level: can do a flight of stairs without difficulty  Sleep Study/ CPAP : none  Fasting Blood Sugar :      / Checks Blood Sugar -- times a day:   Blood Thinner/ Instructions /Last Dose: ASA / Instructions/ Last Dose :   Covid test- 04/29/21- pt would like done at [facility] since she works across the street from [facility] - have called and [facility] .  cALALED AGIN AT 1615PM AND lvmm.  Covid test to be done at [facility] per [facility] PST.  PT called and made aware and instructed where to go on12/6/22 to the [facility] [place] between the hourse of 8-12.   PT is an RN  Prediabetes- does not check glucose  Hgba1c-04/25/21-6.0

## 2021-04-22 NOTE — Progress Notes (Signed)
DUE TO COVID-19 ONLY ONE VISITOR IS ALLOWED TO COME WITH YOU AND STAY IN THE WAITING ROOM ONLY DURING PRE OP AND PROCEDURE DAY OF SURGERY.  2 VISITOR  MAY VISIT WITH YOU AFTER SURGERY IN YOUR PRIVATE ROOM DURING VISITING HOURS ONLY!  YOU NEED TO HAVE A COVID 19 TEST ON_12/10/2020 @_  @_from  8am-3pm _____, THIS TEST MUST BE DONE BEFORE SURGERY,  Covid test is done at [place], [place] [address].  This is a drive thru.  No appt required. Please see map.                 Your procedure is scheduled on:    05/02/2021   Report to [facility] Main  Entrance   Report to admitting at   1215 pm      Call this number if you have problems the morning of surgery [phone]    REMEMBER: NO  SOLID FOOD CANDY OR GUM AFTER MIDNIGHT. CLEAR LIQUIDS UNTIL   1130am        . NOTHING BY MOUTH EXCEPT CLEAR LIQUIDS UNTIL    1130am    . PLEASE FINISH ENSURE DRINK PER SURGEON ORDER  WHICH NEEDS TO BE COMPLETED AT    1130am   .      CLEAR LIQUID DIET   Foods Allowed                                                                    Coffee and tea, regular and decaf                            Fruit ices (not with fruit pulp)                                      Iced Popsicles                                    Carbonated beverages, regular and diet                                    Cranberry, grape and apple juices Sports drinks like Gatorade Lightly seasoned clear broth or consume(fat free) Sugar, honey syrup ___________________________________________________________________      BRUSH YOUR TEETH MORNING OF SURGERY AND RINSE YOUR MOUTH OUT, NO CHEWING GUM CANDY OR MINTS.     Take these medicines the morning of surgery with A SIP OF WATER: nebulizer if needed, in halers as usual and bring, zyrtec, flonase   DO NOT TAKE ANY DIABETIC MEDICATIONS DAY OF YOUR SURGERY                               You may not have any metal on your body including hair pins and               piercings  Do not wear jewelry, make-up, lotions, powders or perfumes, deodorant  Do not wear nail polish on your fingernails.  Do not shave  48 hours prior to surgery.              Men may shave face and neck.   Do not bring valuables to the hospital. [place].  Contacts, dentures or bridgework may not be worn into surgery.  Leave suitcase in the car. After surgery it may be brought to your room.     Patients discharged the day of surgery will not be allowed to drive home. IF YOU ARE HAVING SURGERY AND GOING HOME THE SAME DAY, YOU MUST HAVE AN ADULT TO DRIVE YOU HOME AND BE WITH YOU FOR 24 HOURS. YOU MAY GO HOME BY TAXI OR UBER OR ORTHERWISE, BUT AN ADULT MUST ACCOMPANY YOU HOME AND STAY WITH YOU FOR 24 HOURS.  Name and phone number of your driver:  Special Instructions: N/A              Please read over the following fact sheets you were given: _____________________________________________________________________  [facility] - Preparing for Surgery Before surgery, you can play an important role.  Because skin is not sterile, your skin needs to be as free of germs as possible.  You can reduce the number of germs on your skin by washing with CHG (chlorahexidine gluconate) soap before surgery.  CHG is an antiseptic cleaner which kills germs and bonds with the skin to continue killing germs even after washing. Please DO NOT use if you have an allergy to CHG or antibacterial soaps.  If your skin becomes reddened/irritated stop using the CHG and inform your nurse when you arrive at Short Stay. Do not shave (including legs and underarms) for at least 48 hours prior to the first CHG shower.  You may shave your face/neck. Please follow these instructions carefully:  1.  Shower with CHG Soap the night before surgery and the  morning of Surgery.  2.  If you choose to wash your hair, wash your hair first as usual with your  normal   shampoo.  3.  After you shampoo, rinse your hair and body thoroughly to remove the  shampoo.                           4.  Use CHG as you would any other liquid soap.  You can apply chg directly  to the skin and wash                       Gently with a scrungie or clean washcloth.  5.  Apply the CHG Soap to your body ONLY FROM THE NECK DOWN.   Do not use on face/ open                           Wound or open sores. Avoid contact with eyes, ears mouth and genitals (private parts).                       Wash face,  Genitals (private parts) with your normal soap.             6.  Wash thoroughly, paying special attention to the area where your surgery  will be performed.  7.  Thoroughly rinse your body with  warm water from the neck down.  8.  DO NOT shower/wash with your normal soap after using and rinsing off  the CHG Soap.                9.  Pat yourself dry with a clean towel.            10.  Wear clean pajamas.            11.  Place clean sheets on your bed the night of your first shower and do not  sleep with pets. Day of Surgery : Do not apply any lotions/deodorants the morning of surgery.  Please wear clean clothes to the hospital/surgery center.  FAILURE TO FOLLOW THESE INSTRUCTIONS MAY RESULT IN THE CANCELLATION OF YOUR SURGERY PATIENT SIGNATURE_________________________________  NURSE SIGNATURE__________________________________  ________________________________________________________________________

## 2021-04-25 ENCOUNTER — Encounter (HOSPITAL_COMMUNITY): Payer: Self-pay

## 2021-04-25 ENCOUNTER — Other Ambulatory Visit: Payer: Self-pay

## 2021-04-25 ENCOUNTER — Encounter (HOSPITAL_COMMUNITY)
Admission: RE | Admit: 2021-04-25 | Discharge: 2021-04-25 | Disposition: A | Discharge status: Home / Self Care | Payer: BC Managed Care – PPO | Source: Ambulatory Visit | Attending: Surgery | Admitting: Surgery

## 2021-04-25 VITALS — BP 145/88 | HR 66 | Temp 98.4°F | Resp 16 | Ht 65.5 in | Wt 252.0 lb

## 2021-04-25 DIAGNOSIS — R7303 Prediabetes: Secondary | ICD-10-CM | POA: Insufficient documentation

## 2021-04-25 DIAGNOSIS — Z01812 Encounter for preprocedural laboratory examination: Secondary | ICD-10-CM | POA: Insufficient documentation

## 2021-04-25 HISTORY — DX: Unspecified asthma, uncomplicated: J45.909

## 2021-04-25 LAB — HEMOGLOBIN A1C
Hgb A1c MFr Bld: 6 % — ABNORMAL HIGH (ref 4.8–5.6)
Mean Plasma Glucose: 125.5 mg/dL

## 2021-04-25 LAB — COMPREHENSIVE METABOLIC PANEL
ALT: 18 U/L (ref 0–44)
AST: 21 U/L (ref 15–41)
Albumin: 4.3 g/dL (ref 3.5–5.0)
Alkaline Phosphatase: 57 U/L (ref 38–126)
Anion gap: 7 (ref 5–15)
BUN: 16 mg/dL (ref 6–20)
CO2: 23 mmol/L (ref 22–32)
Calcium: 9.1 mg/dL (ref 8.9–10.3)
Chloride: 106 mmol/L (ref 98–111)
Creatinine, Ser: 0.91 mg/dL (ref 0.44–1.00)
GFR, Estimated: >60 mL/min (ref >60)
Glucose, Bld: 87 mg/dL (ref 70–99)
Potassium: 3.6 mmol/L (ref 3.5–5.1)
Sodium: 136 mmol/L (ref 135–145)
Total Bilirubin: 0.8 mg/dL (ref 0.3–1.2)
Total Protein: 7.9 g/dL (ref 6.5–8.1)

## 2021-04-25 LAB — CBC WITH DIFFERENTIAL/PLATELET
Abs Immature Granulocytes: 0 10*3/uL (ref 0.00–0.07)
Basophils Absolute: 0 10*3/uL (ref 0.0–0.1)
Basophils Relative: 1 %
Eosinophils Absolute: 0.1 10*3/uL (ref 0.0–0.5)
Eosinophils Relative: 1 %
HCT: 37.7 % (ref 36.0–46.0)
Hemoglobin: 12.3 g/dL (ref 12.0–15.0)
Immature Granulocytes: 0 %
Lymphocytes Relative: 54 %
Lymphs Abs: 2.1 10*3/uL (ref 0.7–4.0)
MCH: 29.8 pg (ref 26.0–34.0)
MCHC: 32.6 g/dL (ref 30.0–36.0)
MCV: 91.3 fL (ref 80.0–100.0)
Monocytes Absolute: 0.3 10*3/uL (ref 0.1–1.0)
Monocytes Relative: 6 %
Neutro Abs: 1.5 10*3/uL — ABNORMAL LOW (ref 1.7–7.7)
Neutrophils Relative %: 38 %
Platelets: 269 10*3/uL (ref 150–400)
RBC: 4.13 MIL/uL (ref 3.87–5.11)
RDW: 11.9 % (ref 11.5–15.5)
WBC: 3.9 10*3/uL — ABNORMAL LOW (ref 4.0–10.5)
nRBC: 0 % (ref 0.0–0.2)

## 2021-04-25 LAB — GLUCOSE, CAPILLARY: Glucose-Capillary: 78 mg/dL (ref 70–99)

## 2021-04-29 ENCOUNTER — Other Ambulatory Visit: Payer: Self-pay

## 2021-04-29 ENCOUNTER — Other Ambulatory Visit
Admission: RE | Admit: 2021-04-29 | Discharge: 2021-04-29 | Disposition: A | Discharge status: Home / Self Care | Payer: BC Managed Care – PPO | Source: Ambulatory Visit | Attending: Surgery | Admitting: Surgery

## 2021-04-29 DIAGNOSIS — Z20822 Contact with and (suspected) exposure to covid-19: Secondary | ICD-10-CM | POA: Insufficient documentation

## 2021-04-29 LAB — SARS CORONAVIRUS 2 (TAT 6-24 HRS): SARS Coronavirus 2: NEGATIVE

## 2021-05-02 ENCOUNTER — Encounter (HOSPITAL_COMMUNITY): Payer: Self-pay | Admitting: Surgery

## 2021-05-02 ENCOUNTER — Ambulatory Visit (HOSPITAL_COMMUNITY): Payer: BC Managed Care – PPO | Admitting: Certified Registered Nurse Anesthetist

## 2021-05-02 ENCOUNTER — Encounter (HOSPITAL_COMMUNITY)
Admission: AD | Disposition: A | Discharge status: Home / Self Care | Payer: Self-pay | Source: Home / Self Care | Attending: Surgery

## 2021-05-02 ENCOUNTER — Other Ambulatory Visit: Payer: Self-pay

## 2021-05-02 ENCOUNTER — Observation Stay (HOSPITAL_COMMUNITY)
Admission: AD | Admit: 2021-05-02 | Discharge: 2021-05-03 | Disposition: A | Discharge status: Home / Self Care | Payer: BC Managed Care – PPO | Attending: Surgery | Admitting: Surgery

## 2021-05-02 DIAGNOSIS — J45909 Unspecified asthma, uncomplicated: Secondary | ICD-10-CM | POA: Diagnosis not present

## 2021-05-02 DIAGNOSIS — Z87891 Personal history of nicotine dependence: Secondary | ICD-10-CM | POA: Insufficient documentation

## 2021-05-02 DIAGNOSIS — Z6841 Body Mass Index (BMI) 40.0 and over, adult: Secondary | ICD-10-CM | POA: Insufficient documentation

## 2021-05-02 DIAGNOSIS — R7303 Prediabetes: Secondary | ICD-10-CM | POA: Insufficient documentation

## 2021-05-02 DIAGNOSIS — E785 Hyperlipidemia, unspecified: Secondary | ICD-10-CM | POA: Diagnosis not present

## 2021-05-02 DIAGNOSIS — J4541 Moderate persistent asthma with (acute) exacerbation: Secondary | ICD-10-CM | POA: Diagnosis not present

## 2021-05-02 HISTORY — PX: UPPER GI ENDOSCOPY: SHX6162

## 2021-05-02 HISTORY — PX: LAPAROSCOPIC GASTRIC SLEEVE RESECTION: SHX5895

## 2021-05-02 LAB — CREATININE, SERUM
Creatinine, Ser: 0.89 mg/dL (ref 0.44–1.00)
GFR, Estimated: >60 mL/min (ref >60)

## 2021-05-02 LAB — CBC
HCT: 33.7 % — ABNORMAL LOW (ref 36.0–46.0)
Hemoglobin: 11.3 g/dL — ABNORMAL LOW (ref 12.0–15.0)
MCH: 30.1 pg (ref 26.0–34.0)
MCHC: 33.5 g/dL (ref 30.0–36.0)
MCV: 89.9 fL (ref 80.0–100.0)
Platelets: 263 10*3/uL (ref 150–400)
RBC: 3.75 MIL/uL — ABNORMAL LOW (ref 3.87–5.11)
RDW: 12.1 % (ref 11.5–15.5)
WBC: 9.5 10*3/uL (ref 4.0–10.5)
nRBC: 0 % (ref 0.0–0.2)

## 2021-05-02 LAB — TYPE AND SCREEN
ABO/RH(D): O NEG
Antibody Screen: NEGATIVE

## 2021-05-02 SURGERY — GASTRECTOMY, SLEEVE, LAPAROSCOPIC
Anesthesia: General

## 2021-05-02 MED ORDER — APREPITANT 40 MG PO CAPS
40.0000 mg | ORAL_CAPSULE | ORAL | Status: AC
  Administered 2021-05-02: 40 mg via ORAL
  Filled 2021-05-02: qty 1

## 2021-05-02 MED ORDER — PROPOFOL 10 MG/ML IV BOLUS
INTRAVENOUS | Status: DC | PRN
  Administered 2021-05-02: 170 mg via INTRAVENOUS

## 2021-05-02 MED ORDER — ACETAMINOPHEN 160 MG/5ML PO SOLN
1000.0000 mg | Freq: Three times a day (TID) | ORAL | Status: DC
  Administered 2021-05-03: 1000 mg via ORAL
  Filled 2021-05-02: qty 40.6

## 2021-05-02 MED ORDER — BUPIVACAINE-EPINEPHRINE 0.25% -1:200000 IJ SOLN
INTRAMUSCULAR | Status: DC | PRN
  Administered 2021-05-02: 30 mL

## 2021-05-02 MED ORDER — ONDANSETRON 4 MG PO TBDP: 4.0000 mg | ORAL_TABLET | Freq: Four times a day (QID) | ORAL | 0 refills | Status: DC | PRN

## 2021-05-02 MED ORDER — DEXAMETHASONE SODIUM PHOSPHATE 10 MG/ML IJ SOLN
INTRAMUSCULAR | Status: AC
  Filled 2021-05-02: qty 1

## 2021-05-02 MED ORDER — MIDAZOLAM HCL 2 MG/2ML IJ SOLN
INTRAMUSCULAR | Status: AC
  Filled 2021-05-02: qty 2

## 2021-05-02 MED ORDER — LORATADINE 10 MG PO TABS
10.0000 mg | ORAL_TABLET | Freq: Every day | ORAL | Status: DC
  Administered 2021-05-03: 10 mg via ORAL
  Filled 2021-05-02: qty 1

## 2021-05-02 MED ORDER — CHLORHEXIDINE GLUCONATE CLOTH 2 % EX PADS: 6.0000 | MEDICATED_PAD | Freq: Once | CUTANEOUS | Status: DC

## 2021-05-02 MED ORDER — SODIUM CHLORIDE 0.9 % IV SOLN
2.0000 g | INTRAVENOUS | Status: AC
  Administered 2021-05-02: 2 g via INTRAVENOUS
  Filled 2021-05-02: qty 2

## 2021-05-02 MED ORDER — OXYCODONE HCL 5 MG/5ML PO SOLN
5.0000 mg | Freq: Four times a day (QID) | ORAL | Status: DC | PRN
  Administered 2021-05-03: 5 mg via ORAL
  Filled 2021-05-02: qty 5

## 2021-05-02 MED ORDER — ROCURONIUM BROMIDE 10 MG/ML (PF) SYRINGE
PREFILLED_SYRINGE | INTRAVENOUS | Status: DC | PRN
  Administered 2021-05-02: 60 mg via INTRAVENOUS

## 2021-05-02 MED ORDER — LIDOCAINE 20MG/ML (2%) 15 ML SYRINGE OPTIME
INTRAMUSCULAR | Status: DC | PRN
  Administered 2021-05-02: 1.5 mg/kg/h via INTRAVENOUS

## 2021-05-02 MED ORDER — KETAMINE HCL-SODIUM CHLORIDE 100-0.9 MG/10ML-% IV SOSY
PREFILLED_SYRINGE | INTRAVENOUS | Status: AC
  Filled 2021-05-02: qty 10

## 2021-05-02 MED ORDER — MIDAZOLAM HCL 5 MG/5ML IJ SOLN
INTRAMUSCULAR | Status: DC | PRN
  Administered 2021-05-02: 2 mg via INTRAVENOUS

## 2021-05-02 MED ORDER — FENTANYL CITRATE PF 50 MCG/ML IJ SOSY: 25.0000 ug | PREFILLED_SYRINGE | INTRAMUSCULAR | Status: DC | PRN

## 2021-05-02 MED ORDER — GABAPENTIN 300 MG PO CAPS
300.0000 mg | ORAL_CAPSULE | ORAL | Status: AC
  Administered 2021-05-02: 300 mg via ORAL
  Filled 2021-05-02: qty 1

## 2021-05-02 MED ORDER — SIMETHICONE 80 MG PO CHEW: 80.0000 mg | CHEWABLE_TABLET | Freq: Four times a day (QID) | ORAL | Status: DC | PRN

## 2021-05-02 MED ORDER — KETAMINE HCL 10 MG/ML IJ SOLN
INTRAMUSCULAR | Status: DC | PRN
  Administered 2021-05-02: 30 mg via INTRAVENOUS

## 2021-05-02 MED ORDER — ENOXAPARIN SODIUM 40 MG/0.4ML IJ SOSY
40.0000 mg | PREFILLED_SYRINGE | INTRAMUSCULAR | Status: AC
  Administered 2021-05-02: 40 mg via SUBCUTANEOUS
  Filled 2021-05-02: qty 0.4

## 2021-05-02 MED ORDER — LACTATED RINGERS IV SOLN: INTRAVENOUS | Status: DC

## 2021-05-02 MED ORDER — ONDANSETRON HCL 4 MG/2ML IJ SOLN
INTRAMUSCULAR | Status: AC
  Filled 2021-05-02: qty 2

## 2021-05-02 MED ORDER — PANTOPRAZOLE SODIUM 40 MG IV SOLR
40.0000 mg | Freq: Every day | INTRAVENOUS | Status: DC
  Administered 2021-05-02: 40 mg via INTRAVENOUS
  Filled 2021-05-02: qty 40

## 2021-05-02 MED ORDER — SUCCINYLCHOLINE CHLORIDE 200 MG/10ML IV SOSY
PREFILLED_SYRINGE | INTRAVENOUS | Status: AC
  Filled 2021-05-02: qty 20

## 2021-05-02 MED ORDER — ACETAMINOPHEN 500 MG PO TABS: 1000.0000 mg | ORAL_TABLET | Freq: Three times a day (TID) | ORAL | 0 refills | Status: AC

## 2021-05-02 MED ORDER — LIDOCAINE 2% (20 MG/ML) 5 ML SYRINGE
INTRAMUSCULAR | Status: DC | PRN
  Administered 2021-05-02: 60 mg via INTRAVENOUS

## 2021-05-02 MED ORDER — CHLORHEXIDINE GLUCONATE 0.12 % MT SOLN
15.0000 mL | Freq: Once | OROMUCOSAL | Status: AC
  Administered 2021-05-02: 15 mL via OROMUCOSAL

## 2021-05-02 MED ORDER — BUPIVACAINE LIPOSOME 1.3 % IJ SUSP: 20.0000 mL | Freq: Once | INTRAMUSCULAR | Status: DC

## 2021-05-02 MED ORDER — ROCURONIUM BROMIDE 10 MG/ML (PF) SYRINGE
PREFILLED_SYRINGE | INTRAVENOUS | Status: AC
  Filled 2021-05-02: qty 10

## 2021-05-02 MED ORDER — FENTANYL CITRATE PF 50 MCG/ML IJ SOSY
PREFILLED_SYRINGE | INTRAMUSCULAR | Status: AC
  Administered 2021-05-02: 50 ug via INTRAVENOUS
  Filled 2021-05-02: qty 3

## 2021-05-02 MED ORDER — MORPHINE SULFATE (PF) 2 MG/ML IV SOLN: 1.0000 mg | INTRAVENOUS | Status: DC | PRN

## 2021-05-02 MED ORDER — ALBUTEROL SULFATE (2.5 MG/3ML) 0.083% IN NEBU: 2.5000 mg | INHALATION_SOLUTION | Freq: Four times a day (QID) | RESPIRATORY_TRACT | Status: DC | PRN

## 2021-05-02 MED ORDER — ONDANSETRON HCL 4 MG/2ML IJ SOLN: 4.0000 mg | INTRAMUSCULAR | Status: DC | PRN

## 2021-05-02 MED ORDER — BUPIVACAINE LIPOSOME 1.3 % IJ SUSP
INTRAMUSCULAR | Status: AC
  Filled 2021-05-02: qty 20

## 2021-05-02 MED ORDER — ACETAMINOPHEN 500 MG PO TABS
1000.0000 mg | ORAL_TABLET | ORAL | Status: AC
  Administered 2021-05-02: 1000 mg via ORAL
  Filled 2021-05-02: qty 2

## 2021-05-02 MED ORDER — DEXAMETHASONE SODIUM PHOSPHATE 10 MG/ML IJ SOLN
INTRAMUSCULAR | Status: DC | PRN
  Administered 2021-05-02: 4 mg via INTRAVENOUS

## 2021-05-02 MED ORDER — PANTOPRAZOLE SODIUM 40 MG PO TBEC: 40.0000 mg | DELAYED_RELEASE_TABLET | Freq: Every day | ORAL | 0 refills | Status: DC

## 2021-05-02 MED ORDER — ORAL CARE MOUTH RINSE: 15.0000 mL | Freq: Once | OROMUCOSAL | Status: AC

## 2021-05-02 MED ORDER — FENTANYL CITRATE (PF) 100 MCG/2ML IJ SOLN
INTRAMUSCULAR | Status: AC
  Filled 2021-05-02: qty 2

## 2021-05-02 MED ORDER — LACTATED RINGERS IR SOLN
Status: DC | PRN
  Administered 2021-05-02: 1000 mL

## 2021-05-02 MED ORDER — OXYCODONE HCL 5 MG PO TABS: 5.0000 mg | ORAL_TABLET | Freq: Four times a day (QID) | ORAL | 0 refills | Status: DC | PRN

## 2021-05-02 MED ORDER — SUGAMMADEX SODIUM 500 MG/5ML IV SOLN
INTRAVENOUS | Status: AC
  Filled 2021-05-02: qty 5

## 2021-05-02 MED ORDER — FLUTICASONE PROPIONATE 50 MCG/ACT NA SUSP
1.0000 | Freq: Two times a day (BID) | NASAL | Status: DC | PRN
  Filled 2021-05-02: qty 16

## 2021-05-02 MED ORDER — FENTANYL CITRATE (PF) 100 MCG/2ML IJ SOLN
INTRAMUSCULAR | Status: DC | PRN
  Administered 2021-05-02: 100 ug via INTRAVENOUS

## 2021-05-02 MED ORDER — ENOXAPARIN SODIUM 30 MG/0.3ML IJ SOSY
30.0000 mg | PREFILLED_SYRINGE | Freq: Two times a day (BID) | INTRAMUSCULAR | Status: DC
  Administered 2021-05-02 – 2021-05-03 (×2): 30 mg via SUBCUTANEOUS
  Filled 2021-05-02 (×2): qty 0.3

## 2021-05-02 MED ORDER — ACETAMINOPHEN 500 MG PO TABS
1000.0000 mg | ORAL_TABLET | Freq: Three times a day (TID) | ORAL | Status: DC
  Administered 2021-05-02 – 2021-05-03 (×2): 1000 mg via ORAL
  Filled 2021-05-02 (×2): qty 2

## 2021-05-02 MED ORDER — SUGAMMADEX SODIUM 200 MG/2ML IV SOLN
INTRAVENOUS | Status: DC | PRN
  Administered 2021-05-02: 300 mg via INTRAVENOUS

## 2021-05-02 MED ORDER — BUPIVACAINE LIPOSOME 1.3 % IJ SUSP
INTRAMUSCULAR | Status: DC | PRN
  Administered 2021-05-02: 20 mL

## 2021-05-02 MED ORDER — ENSURE MAX PROTEIN PO LIQD
2.0000 [oz_av] | ORAL | Status: DC
  Administered 2021-05-03 (×4): 2 [oz_av] via ORAL

## 2021-05-02 MED ORDER — SUCCINYLCHOLINE CHLORIDE 200 MG/10ML IV SOSY
PREFILLED_SYRINGE | INTRAVENOUS | Status: DC | PRN
  Administered 2021-05-02: 140 mg via INTRAVENOUS

## 2021-05-02 MED ORDER — PHENYLEPHRINE HCL-NACL 20-0.9 MG/250ML-% IV SOLN
INTRAVENOUS | Status: DC | PRN
  Administered 2021-05-02: 40 ug/min via INTRAVENOUS

## 2021-05-02 SURGICAL SUPPLY — 55 items
BAG COUNTER SPONGE SURGICOUNT (BAG) IMPLANT
BAG SURGICOUNT SPONGE COUNTING (BAG)
BLADE SURG SZ11 CARB STEEL (BLADE) ×3 IMPLANT
CHLORAPREP W/TINT 26 (MISCELLANEOUS) ×6 IMPLANT
COVER SURGICAL LIGHT HANDLE (MISCELLANEOUS) ×3 IMPLANT
DECANTER SPIKE VIAL GLASS SM (MISCELLANEOUS) ×3 IMPLANT
DERMABOND ADVANCED (GAUZE/BANDAGES/DRESSINGS) ×2
DERMABOND ADVANCED .7 DNX12 (GAUZE/BANDAGES/DRESSINGS) ×1 IMPLANT
DRAPE UTILITY XL STRL (DRAPES) ×6 IMPLANT
ELECT REM PT RETURN 15FT ADLT (MISCELLANEOUS) ×3 IMPLANT
GLOVE SRG 8 PF TXTR STRL LF DI (GLOVE) ×1 IMPLANT
GLOVE SURG ENC MOIS LTX SZ7.5 (GLOVE) ×3 IMPLANT
GLOVE SURG UNDER POLY LF SZ8 (GLOVE) ×2
GOWN STRL REUS W/TWL XL LVL3 (GOWN DISPOSABLE) ×6 IMPLANT
GRASPER SUT TROCAR 14GX15 (MISCELLANEOUS) ×3 IMPLANT
HEMOSTAT SNOW SURGICEL 2X4 (HEMOSTASIS) ×3 IMPLANT
IRRIG SUCT STRYKERFLOW 2 WTIP (MISCELLANEOUS) ×3
IRRIGATION SUCT STRKRFLW 2 WTP (MISCELLANEOUS) ×1 IMPLANT
KIT BASIN OR (CUSTOM PROCEDURE TRAY) ×3 IMPLANT
KIT TURNOVER KIT A (KITS) IMPLANT
MAT PREVALON FULL STRYKER (MISCELLANEOUS) ×3 IMPLANT
NEEDLE SPNL 18GX3.5 QUINCKE PK (NEEDLE) ×3 IMPLANT
NS IRRIG 1000ML POUR BTL (IV SOLUTION) ×3 IMPLANT
PACK UNIVERSAL I (CUSTOM PROCEDURE TRAY) ×3 IMPLANT
RELOAD STAPLER BLUE 60MM (STAPLE) ×4 IMPLANT
RELOAD STAPLER GOLD 60MM (STAPLE) ×1 IMPLANT
RELOAD STAPLER GREEN 60MM (STAPLE) IMPLANT
SCISSORS LAP 5X45 EPIX DISP (ENDOMECHANICALS) ×3 IMPLANT
SEALER TISSUE G2 CVD JAW 45CM (ENDOMECHANICALS) IMPLANT
SET TUBE SMOKE EVAC HIGH FLOW (TUBING) ×3 IMPLANT
SLEEVE ADV FIXATION 5X100MM (TROCAR) ×6 IMPLANT
SLEEVE GASTRECTOMY 40FR VISIGI (MISCELLANEOUS) ×3 IMPLANT
SOL ANTI FOG 6CC (MISCELLANEOUS) ×1 IMPLANT
SOLUTION ANTI FOG 6CC (MISCELLANEOUS) ×2
SPONGE T-LAP 18X18 ~~LOC~~+RFID (SPONGE) ×3 IMPLANT
STAPLER ECHELON BIOABSB 60 FLE (MISCELLANEOUS) IMPLANT
STAPLER ECHELON LONG 60 440 (INSTRUMENTS) ×3 IMPLANT
STAPLER RELOAD BLUE 60MM (STAPLE) ×12
STAPLER RELOAD GOLD 60MM (STAPLE) ×3
STAPLER RELOAD GREEN 60MM (STAPLE)
SUT MNCRL AB 4-0 PS2 18 (SUTURE) ×3 IMPLANT
SUT VIC AB 2-0 SH 27 (SUTURE) ×2
SUT VIC AB 2-0 SH 27X BRD (SUTURE) ×1 IMPLANT
SUT VICRYL 0 TIES 12 18 (SUTURE) ×3 IMPLANT
SYR 20ML LL LF (SYRINGE) ×3 IMPLANT
SYR 50ML LL SCALE MARK (SYRINGE) ×3 IMPLANT
TOWEL OR 17X26 10 PK STRL BLUE (TOWEL DISPOSABLE) ×3 IMPLANT
TOWEL OR NON WOVEN STRL DISP B (DISPOSABLE) ×3 IMPLANT
TROCAR ADV FIXATION 5X100MM (TROCAR) ×3 IMPLANT
TROCAR BLADELESS 15MM (ENDOMECHANICALS) ×3 IMPLANT
TROCAR BLADELESS OPT 5 100 (ENDOMECHANICALS) ×3 IMPLANT
TUBING CONNECTING 10 (TUBING) ×2 IMPLANT
TUBING CONNECTING 10' (TUBING) ×1
TUBING ENDO SMARTCAP (MISCELLANEOUS) ×3 IMPLANT
WATER STERILE IRR 1000ML POUR (IV SOLUTION) ×3 IMPLANT

## 2021-05-02 NOTE — Progress Notes (Signed)
Notified patient of delay for surgery. Patient verbalized understanding.

## 2021-05-02 NOTE — Anesthesia Postprocedure Evaluation (Signed)
Anesthesia Post Note  Patient: Advertising account executive  Procedure(s) Performed: LAPAROSCOPIC GASTRIC SLEEVE RESECTION UPPER GI ENDOSCOPY     Patient location during evaluation: PACU Anesthesia Type: General Level of consciousness: awake and alert Pain management: pain level controlled Vital Signs Assessment: post-procedure vital signs reviewed and stable Respiratory status: spontaneous breathing, nonlabored ventilation, respiratory function stable and patient connected to nasal cannula oxygen Cardiovascular status: blood pressure returned to baseline and stable Postop Assessment: no apparent nausea or vomiting Anesthetic complications: no   No notable events documented.  Last Vitals:  Vitals:   05/02/21 1800 05/02/21 1815  BP: (!) 148/78 135/70  Pulse: 75 71  Resp: 17 17  Temp:    SpO2: 94% 94%    Last Pain:  Vitals:   05/02/21 1815  TempSrc:   PainSc: Asleep                 Belenda Cruise P Kendarious Gudino

## 2021-05-02 NOTE — Anesthesia Procedure Notes (Signed)
Procedure Name: Intubation Date/Time: 05/02/2021 4:26 PM Performed by: Montel Clock, CRNA Pre-anesthesia Checklist: Patient identified, Emergency Drugs available, Suction available, Patient being monitored and Timeout performed Patient Re-evaluated:Patient Re-evaluated prior to induction Oxygen Delivery Method: Circle system utilized Preoxygenation: Pre-oxygenation with 100% oxygen Induction Type: IV induction and Rapid sequence Laryngoscope Size: Mac and 3 Grade View: Grade II Tube type: Oral Tube size: 7.0 mm Number of attempts: 1 Airway Equipment and Method: Stylet Placement Confirmation: ETT inserted through vocal cords under direct vision, positive ETCO2 and breath sounds checked- equal and bilateral Secured at: 22 cm Tube secured with: Tape Dental Injury: Teeth and Oropharynx as per pre-operative assessment

## 2021-05-02 NOTE — Op Note (Signed)
Nichole [name] [id number] [birth year] 05/02/2021  Preoperative diagnosis: morbid obesity; sleeve in progress  Postoperative diagnosis: Same   Procedure: Upper endoscopy   Surgeon: Catalina Antigua B. Hassell Done  M.D., FACS   Anesthesia: Gen.   Indications for procedure: This patient was undergoing a sleeve gastrectomy by Dr. Thermon Leyland.    Description of procedure: The endoscopy was placed in the mouth and into the oropharynx and under endoscopic vision it was advanced to the esophagogastric junction.  The pouch was insufflated and the sleeve traversed to the pylorus.   No bleeding or leaks were detected.  The scope was withdrawn without difficulty.     Matt B. Hassell Done, MD, FACS General, Bariatric, & Minimally Invasive Surgery [facility], [place]

## 2021-05-02 NOTE — Anesthesia Preprocedure Evaluation (Signed)
Anesthesia Evaluation  Patient identified by MRN, date of birth, ID band Patient awake    Reviewed: Allergy & Precautions, NPO status , Patient's Chart, lab work & pertinent test results  Airway Mallampati: II  TM Distance: >3 FB Neck ROM: Full    Dental no notable dental hx.    Pulmonary asthma ,  COPD inhaler, former smoker,    Pulmonary exam normal        Cardiovascular negative cardio ROS   Rhythm:Regular Rate:Normal     Neuro/Psych negative neurological ROS  negative psych ROS   GI/Hepatic negative GI ROS, Neg liver ROS,   Endo/Other  negative endocrine ROS  Renal/GU negative Renal ROS  negative genitourinary   Musculoskeletal negative musculoskeletal ROS (+)   Abdominal (+) + obese,   Peds  Hematology negative hematology ROS (+)   Anesthesia Other Findings   Reproductive/Obstetrics                             Anesthesia Physical Anesthesia Plan  ASA: 3  Anesthesia Plan: General   Post-op Pain Management: Ketamine IV and Lidocaine infusion   Induction: Intravenous  PONV Risk Score and Plan: 3 and Ondansetron, Dexamethasone, Midazolam and Treatment may vary due to age or medical condition  Airway Management Planned: Mask and Oral ETT  Additional Equipment: None  Intra-op Plan:   Post-operative Plan: Extubation in OR  Informed Consent: I have reviewed the patients History and Physical, chart, labs and discussed the procedure including the risks, benefits and alternatives for the proposed anesthesia with the patient or authorized representative who has indicated his/her understanding and acceptance.     Dental advisory given  Plan Discussed with: CRNA  Anesthesia Plan Comments: (Lab Results      Component                Value               Date                      WBC                      3.9 (L)             04/25/2021                HGB                      12.3                 04/25/2021                HCT                      37.7                04/25/2021                MCV                      91.3                04/25/2021                PLT                      269  04/25/2021           Lab Results      Component                Value               Date                      NA                       136                 04/25/2021                K                        3.6                 04/25/2021                CO2                      23                  04/25/2021                GLUCOSE                  87                  04/25/2021                BUN                      16                  04/25/2021                CREATININE               0.91                04/25/2021                CALCIUM                  9.1                 04/25/2021                GFRNONAA                 >60                 04/25/2021          )        Anesthesia Quick Evaluation

## 2021-05-02 NOTE — H&P (Signed)
Admitting Physician: [place]  Service: Bariatric surgery  CC: Morbid obesity (BMI 41)  Subjective   HPI: Nichole [name] is an 45 y.o. female who is here for laparoscopic sleeve gastrectomy  Past Medical History:  Diagnosis Date   Asthma    exercise induced asthma   Pre-diabetes     Past Surgical History:  Procedure Laterality Date   BIOPSY  03/04/2021   Procedure: BIOPSY;  Surgeon: Felicie Morn, MD;  Location: [facility];  Service: General;;   ESOPHAGOGASTRODUODENOSCOPY N/A 03/04/2021   Procedure: ESOPHAGOGASTRODUODENOSCOPY (EGD);  Surgeon: Felicie Morn, MD;  Location: [facility];  Service: General;  Laterality: N/A;   LAPAROSCOPIC VAGINAL HYSTERECTOMY WITH SALPINGECTOMY N/A 10/08/2016   Procedure: LAPAROSCOPIC ASSISTED VAGINAL HYSTERECTOMY WITH RIGHT SALPINGECTOMY LYSIS OF ADHESIONS;  Surgeon: Donnamae Jude, MD;  Location: [facility];  Service: Gynecology;  Laterality: N/A;   MYOMECTOMY  2014   Right Oophrectomy     WISDOM TOOTH EXTRACTION      Family History  Problem Relation Age of Onset   Arthritis Mother    Breast cancer Mother 76       Lumpectomy   Hyperlipidemia Mother    Hypertension Mother    Kidney cancer Mother 34       Had ablasion   Graves' disease Mother        Had thyroid removed   Graves' disease Brother        Deceased    Social:  reports that she quit smoking about 4 years ago. Her smoking use included cigarettes. She has a 1.50 pack-year smoking history. She has never used smokeless tobacco. She reports that she does not currently use alcohol. She reports that she does not currently use drugs after having used the following drugs: Marijuana.  Allergies: No Known Allergies  Medications: Current Outpatient Medications  Medication Instructions   albuterol (VENTOLIN HFA) 108 (90 Base) MCG/ACT inhaler 1-2 puffs, Inhalation, Every 6 hours PRN   cetirizine (ZYRTEC) 10 MG tablet TAKE 1 TABLET (10 MG TOTAL) BY MOUTH  EVERY EVENING. FOR ALLERGIES. NOT COVERED   fluticasone (FLONASE) 50 MCG/ACT nasal spray 1 spray, Each Nare, 2 times daily PRN    ROS - all of the below systems have been reviewed with the patient and positives are indicated with bold text General: chills, fever or night sweats Eyes: blurry vision or double vision ENT: epistaxis or sore throat Allergy/Immunology: itchy/watery eyes or nasal congestion Hematologic/Lymphatic: bleeding problems, blood clots or swollen lymph nodes Endocrine: temperature intolerance or unexpected weight changes Breast: new or changing breast lumps or nipple discharge Resp: cough, shortness of breath, or wheezing CV: chest pain or dyspnea on exertion GI: as per HPI GU: dysuria, trouble voiding, or hematuria MSK: joint pain or joint stiffness Neuro: TIA or stroke symptoms Derm: pruritus and skin lesion changes Psych: anxiety and depression  Objective   PE Blood pressure 140/83, pulse 75, temperature 98.4 F (36.9 C), temperature source Oral, resp. rate 16, height 5' 5.5" (1.664 m), weight 113.7 kg, last menstrual period 09/09/2016, SpO2 100 %. Constitutional: NAD; conversant; no deformities Eyes: Moist conjunctiva; no lid lag; anicteric; PERRL Neck: Trachea midline; no thyromegaly Lungs: Normal respiratory effort; no tactile fremitus CV: RRR; no palpable thrills; no pitting edema GI: Abd Soft, nontender; no palpable hepatosplenomegaly MSK: Normal range of motion of extremities; no clubbing/cyanosis Psychiatric: Appropriate affect; alert and oriented x3 Lymphatic: No palpable cervical or axillary lymphadenopathy  No results found for this or any previous visit (  from the past 24 hour(s)).  Imaging Orders  No imaging studies ordered today     Assessment and Plan   [name] is a 45 year old female who presented to discuss the surgical options to treat obesity and its associated comorbidity. The patient has morbid obesity with a BMI of Body mass  index is 43.49 kg/m. and the following conditions related to obesity: prediabetes.   After discussing the available procedures in the region, we discussed in great detail the surgeries I offer: robotic sleeve gastrectomy and robotic roux-en-y gastric bypass. We discussed the procedures themselves as well as their risks, benefits and alternatives. I entered the patient's basic information into the [facility] Metabolic Surgery Risk/Benefit Calculator to facilitate this discussion.    After a full discussion and all questions answered, the patient is interested in pursuing a sleeve gastrectomy  The preoperative pathway includes the following: - Bloodwork, notable for HgA1c 6.7 - Dietician consult - Chest x-ray completed 02/06/21 - no active cardiopulmonary disease - EKG completed 02/06/21 - NSR - Psychology evaluation -She scored a 1 on the STOP-BANG questionnaire so we will not get a sleep study. - Upper endoscopy normal, positive for H. Pylori so she was provided treatment  Today she presents for surgery.  We again reviewed the risks, benefits and alternatives.  After a full discussion and all questions answered the patient granted consent to proceed.  We will proceed as scheduled.  Felicie Morn, MD  [facility] Use [email or web address] to contact on call provider

## 2021-05-02 NOTE — Transfer of Care (Signed)
Immediate Anesthesia Transfer of Care Note  Patient: Nichole [name]  Procedure(s) Performed: LAPAROSCOPIC GASTRIC SLEEVE RESECTION UPPER GI ENDOSCOPY  Patient Location: PACU  Anesthesia Type:General  Level of Consciousness: drowsy and patient cooperative  Airway & Oxygen Therapy: Patient Spontanous Breathing and Patient connected to face mask oxygen  Post-op Assessment: Report given to RN and Post -op Vital signs reviewed and stable  Post vital signs: Reviewed and stable  Last Vitals:  Vitals Value Taken Time  BP 156/95 05/02/21 1735  Temp    Pulse 84 05/02/21 1737  Resp 24 05/02/21 1737  SpO2 100 % 05/02/21 1737  Vitals shown include unvalidated device data.  Last Pain:  Vitals:   05/02/21 1417  TempSrc: Oral  PainSc:       Patients Stated Pain Goal: 3 (47/07/61 5183)  Complications: No notable events documented.

## 2021-05-02 NOTE — Discharge Instructions (Signed)

## 2021-05-02 NOTE — Progress Notes (Signed)
Patient arrived to the floor and attempted to stand and walk to chair. Patient unable to safely ambulate to chair so nursing team moved her from stretcher to bed. Patient immediately started snoring and is unable to answer any questions at this time. Will continue to monitor.

## 2021-05-02 NOTE — Progress Notes (Signed)
PHARMACY CONSULT FOR:  Risk Assessment for Post-Discharge VTE Following Bariatric Surgery  Post-Discharge VTE Risk Assessment: This patient's probability of 30-day post-discharge VTE is increased due to the factors marked:   Female    Age >/=60 years    BMI >/=50 kg/m2    CHF    Dyspnea at Rest    Paraplegia  x  Non-gastric-band surgery    Operation Time >/=3 hr    Return to OR     Length of Stay >/= 3 d   Hx of VTE   Hypercoagulable condition   Significant venous stasis    Predicted probability of 30-day post-discharge VTE: 0.16%  Recommendation for Discharge: No pharmacologic prophylaxis post-discharge  Nichole [name] is a 45 y.o. female who underwent gastric sleeve resection on 05/02/21   Case start: 1433 Case end: 1723   No Known Allergies  Patient Measurements: Height: 5' 5.5" (166.4 cm) Weight: 113.7 kg (250 lb 9.6 oz) IBW/kg (Calculated) : 58.15 Body mass index is 41.07 kg/m.  No results for input(s): WBC, HGB, HCT, PLT, APTT, CREATININE, LABCREA, CREATININE, CREAT24HRUR, MG, PHOS, ALBUMIN, PROT, ALBUMIN, AST, ALT, ALKPHOS, BILITOT, BILIDIR, IBILI in the last 72 hours. Estimated Creatinine Clearance: 101.2 mL/min (by C-G formula based on SCr of 0.91 mg/dL).    Past Medical History:  Diagnosis Date   Asthma    exercise induced asthma   Pre-diabetes      Medications Prior to Admission  Medication Sig Dispense Refill Last Dose   albuterol (VENTOLIN HFA) 108 (90 Base) MCG/ACT inhaler Inhale 1-2 puffs into the lungs every 6 (six) hours as needed for wheezing or shortness of breath. 1 each 0 Past Month   cetirizine (ZYRTEC) 10 MG tablet TAKE 1 TABLET (10 MG TOTAL) BY MOUTH EVERY EVENING. FOR ALLERGIES. NOT COVERED (Patient taking differently: Take 10 mg by mouth daily.) 90 tablet 1 Past Week   fluticasone (FLONASE) 50 MCG/ACT nasal spray Place 1 spray into both nostrils 2 (two) times daily as needed for allergies or rhinitis. 48 mL 0 More than a month      Lenis Noon, PharmD 05/02/2021,6:56 PM

## 2021-05-02 NOTE — Op Note (Signed)
Patient: Nichole [name] (10-Jul-1975, [id number])  Date of Surgery: 05/02/2021   Preoperative Diagnosis: morbid obesity   Postoperative Diagnosis: Morbid obesity  Surgical Procedure: LAPAROSCOPIC GASTRIC SLEEVE RESECTION: [id number] UPPER GI ENDOSCOPY: [id number]   Operative Team Members:  Surgeon(s) and Role:    * Jakiera Ehler, Nickola Major, MD - Primary    * Johnathan Hausen, MD - Assisting   Anesthesiologist: Darral Dash, DO CRNA: Montel Clock, CRNA   Anesthesia: General   Fluids:  Total I/O In: 100 [IV [id number]:353] Out: -   Complications: None  Drains:  none   Specimen:  ID Type Source Tests Collected by Time Destination  1 : stomach GI Stomach SURGICAL PATHOLOGY Jayren Cease, Nickola Major, MD 05/02/2021 1649      Disposition:  PACU - hemodynamically stable.  Plan of Care: Admit to inpatient     Indications for Procedure:  Ms. Akerson is a 45 year old female who presented to discuss the surgical options to treat obesity and its associated comorbidity. The patient has morbid obesity with a BMI of Body mass index is 43.49 kg/m. and the following conditions related to obesity: prediabetes.   After discussing the available procedures in the region, we discussed in great detail the surgeries I offer: robotic sleeve gastrectomy and robotic roux-en-y gastric bypass. We discussed the procedures themselves as well as their risks, benefits and alternatives. I entered the patient's basic information into the [facility] Metabolic Surgery Risk/Benefit Calculator to facilitate this discussion.    After a full discussion and all questions answered, the patient is interested in pursuing a sleeve gastrectomy  The preoperative pathway includes the following: - Bloodwork, notable for HgA1c 6.7 - Dietician consult - Chest x-ray completed 02/06/21 - no active cardiopulmonary disease - EKG completed 02/06/21 - NSR - Psychology evaluation -She scored a 1 on the STOP-BANG questionnaire so we  will not get a sleep study. - Upper endoscopy normal, positive for H. Pylori so she was provided treatment   Today she presents for surgery.  We again reviewed the risks, benefits and alternatives.  After a full discussion and all questions answered the patient granted consent to proceed.  We will proceed as scheduled.   Findings: Normal anatomy  Infection status: Patient: Private Patient Elective Case Case: Elective Infection Present At Time Of Surgery (PATOS): Some spillage of foregut  and jejunal contents while creating anastomoses   Description of Procedure:   On the date stated above, the patient was taken to the operating room suite and placed in supine positioning.  General endotracheal anesthesia was induced.  A timeout was completed verifying the correct patient, procedure, positioning and equipment needed for the case.  The patient's abdomen was prepped and draped in the usual sterile fashion.  I entered the patient's left upper quadrant using a 5 mm trocar in the optical technique.  There was no trauma to underlying viscera with initial trocar placement.  The abdomen was insufflated 15 mmHg.  A total of 4 trochars were placed across the mid abdomen, including a 12 mm in the right mid-abdomen, 71mm in the left mid-abdomen and 14mm in the left lateral abdomen.  The [facility] liver retractor was placed through the subxiphoid region and under the left lobe of the liver and was connected to the rail of the bed.  A TAP block was placed using marcaine and Exparel under direct vision of the laparoscope.   We began by dissecting the angle of His off the left crus of the diaphragm.  The adhesions between the stomach, spleen and diaphragm were divided using the ligasure to define the angle of His.  I then started 6 cm away from the pylorus along the greater curve the stomach and divided the gastroepiploic vessels and the gastrocolic ligament.  The lesser sac was entered.  There were really no   adhesions to the posterior wall of the stomach.  The greater curve was mobilized working superiorly toward the spleen.  All of the gastroepiploic and short gastric vessels were divided as we divided the gastrocolic and gastrosplenic ligaments.  As we reached the splenic hilum, I lifted the stomach anteriorly.  I created a tunnel between the stomach and its attachments to the retroperitoneum posteriorly just to the left of the GE junction until I encountered the left crus and my previous angle of His dissection.  We then were able to approach the shortest of the short gastrics both from the greater curve the stomach laterally and from the left crus medially.  These were divided using the ligasure and the fundus of the stomach was fully mobilized.  With the stomach fully mobilized we direct our attention to stapling.  A 40 French VISI G was inserted into the stomach and positioned along the lesser curve the stomach and suction was applied.  The 60 mm ethicon stapler was used to create the sleeve gastrectomy using a black load, gold load then blue loads.  We started 6 cm from the pylorus and were careful to avoid narrowing at the incisura.  We stayed about 1 cm away from the GE junction to protect the sling fibers.   With the sleeve gastrectomy completed the VISI G was taken off suction and removed and we performed an upper endoscopy.  The foregut was submerged in saline irrigation and the adult upper endoscope was inserted into the stomach as far as the pylorus to inspect the sleeve.  The sleeve appeared appropriately oriented without any twisting.  There was good hemostasis.  The sleeve was widely patent at the incisura with no narrowing.  There was no significant retained fundus.  The sleeve was inflated with the endoscope and there was no bubbling of the irrigation, suggesting a negative leak test and a airtight sleeve gastrectomy.  The foregut was decompressed with the endoscope and the endoscope was removed.   Then, a 2-0 vicryl suture was run along the last two staple lines to ensure hemostasis and pexy the omentum to the distal sleeve. A piece of snow topical hemostatic was placed near where the shortest of the short gastrics were divided off the spleen.  There was good hemostasis at the end of the case.  The robot was undocked and moved away from the field.  The sleeve gastrectomy specimen was removed from the stapler port.  The fascia of the stapler port was closed using a 0 Vicryl on a PMI suture passer.   The liver retractor was removed under direct vision.  The pneumoperitoneum was evacuated.  The skin was closed using 4-0 Monocryl and Dermabond.  All sponge and needle counts were correct at the end of the case.    Louanna Raw, MD General, Bariatric, & Minimally Invasive Surgery [facility], [place]

## 2021-05-03 ENCOUNTER — Encounter (HOSPITAL_COMMUNITY): Payer: Self-pay | Admitting: Surgery

## 2021-05-03 DIAGNOSIS — J45909 Unspecified asthma, uncomplicated: Secondary | ICD-10-CM | POA: Diagnosis not present

## 2021-05-03 DIAGNOSIS — Z6841 Body Mass Index (BMI) 40.0 and over, adult: Secondary | ICD-10-CM | POA: Diagnosis not present

## 2021-05-03 DIAGNOSIS — Z87891 Personal history of nicotine dependence: Secondary | ICD-10-CM | POA: Diagnosis not present

## 2021-05-03 DIAGNOSIS — R7303 Prediabetes: Secondary | ICD-10-CM | POA: Diagnosis not present

## 2021-05-03 LAB — CBC WITH DIFFERENTIAL/PLATELET
Abs Immature Granulocytes: 0.01 10*3/uL (ref 0.00–0.07)
Basophils Absolute: 0 10*3/uL (ref 0.0–0.1)
Basophils Relative: 0 %
Eosinophils Absolute: 0 10*3/uL (ref 0.0–0.5)
Eosinophils Relative: 0 %
HCT: 29.5 % — ABNORMAL LOW (ref 36.0–46.0)
Hemoglobin: 9.6 g/dL — ABNORMAL LOW (ref 12.0–15.0)
Immature Granulocytes: 0 %
Lymphocytes Relative: 25 %
Lymphs Abs: 1.3 10*3/uL (ref 0.7–4.0)
MCH: 30 pg (ref 26.0–34.0)
MCHC: 32.5 g/dL (ref 30.0–36.0)
MCV: 92.2 fL (ref 80.0–100.0)
Monocytes Absolute: 0.4 10*3/uL (ref 0.1–1.0)
Monocytes Relative: 8 %
Neutro Abs: 3.4 10*3/uL (ref 1.7–7.7)
Neutrophils Relative %: 67 %
Platelets: 261 10*3/uL (ref 150–400)
RBC: 3.2 MIL/uL — ABNORMAL LOW (ref 3.87–5.11)
RDW: 12 % (ref 11.5–15.5)
WBC: 5.1 10*3/uL (ref 4.0–10.5)
nRBC: 0 % (ref 0.0–0.2)

## 2021-05-03 NOTE — Plan of Care (Signed)

## 2021-05-03 NOTE — Progress Notes (Signed)
S: no acute events. Tolerating liquids w/o difficulty. Pain/nausea well controlled. Walking halls.   O: Vitals, labs, intake/output, and orders reviewed at this time. Afebrile, no tachycardia, normo-to mildly hypertensive. Sats 98% RA. PO 240. UOP 1250. CMP unremarkable. Wbc 9.5, hgb 11.3 (12.3 preop)   Gen: A&Ox3, no distress  H&N: EOMI, atraumatic, neck supple Chest: unlabored respirations, RRR Abd: soft, appropriately tender at extraction port site, nondistended, incision(s) c/d/i with dermabond Ext: warm, no edema Neuro: grossly normal  Lines/tubes/drains: PIV  A/P: POD 1 sleeve gastrectomy -Continue liquids -Continue scds, lovenox, ambulate as tolerated -Plan discharge home later today   Romana Juniper, MD [facility], [place]

## 2021-05-03 NOTE — Progress Notes (Signed)
Patient alert and oriented, pain is controlled. Patient is tolerating fluids, advanced to protein shake today, patient is tolerating well.  Reviewed Gastric sleeve discharge instructions with patient and patient is able to articulate understanding.  Provided information on BELT program, Support Group and [facility] outpatient pharmacy. All questions answered, will continue to monitor.  

## 2021-05-03 NOTE — Progress Notes (Signed)
Patient went to the bathroom with help and voided. Started 1st cups of water at 2300. Patient still drowsy, unable to walk in hallway. Will continue to monitor.

## 2021-05-04 NOTE — Discharge Summary (Signed)
Physician Discharge Summary  Nichole [name] WUJ:[id number] DOB: 1976-04-04 DOA: 05/02/2021  PCP: Pleas Koch, NP  Admit date: 05/02/2021 Discharge date: 05/03/2021  Recommendations for Outpatient Follow-up:    Follow-up Information     Stechschulte, Nickola Major, MD. Go on 05/23/2021.   Specialty: Surgery Why: at 2:40pm.  Please arrive 15 minutes prior to your appointment time.  Thank you. Contact information: [place]. [address] [place] [address] [phone]         Carlena Hurl, PA-C. Go on 06/20/2021.   Specialty: General Surgery Why: at 9:30am for Dr. Thermon Leyland.  Please arrive 15 minutes prior to your appointment time.  Thank you. Contact information: [place] [address] [phone]                Discharge Diagnoses:  Principal Problem:   Morbid obesity with BMI of 40.0-44.9, adult [facility])   Surgical Procedure: Laparoscopic Sleeve Gastrectomy, upper endoscopy  Discharge Condition: Good Disposition: Home  Diet recommendation: Postoperative sleeve gastrectomy diet (liquids only)  Filed Weights   05/02/21 1413  Weight: 113.7 kg     Hospital Course:  The patient was admitted for a planned laparoscopic sleeve gastrectomy. Please see operative note.  On postoperative day 1 the patient had no fever or tachycardia and was tolerating water in their diet was gradually advanced throughout the day. The patient was ambulating without difficulty. Their vital signs are stable without fever or tachycardia.  The patient had received discharge instructions and counseling. They were deemed stable for discharge and had met discharge criteria   Discharge Instructions  Discharge Instructions     Ambulate hourly while awake   Complete by: As directed    Call MD for:  difficulty breathing, headache or visual disturbances   Complete by: As directed    Call MD for:  persistant dizziness or light-headedness   Complete by: As  directed    Call MD for:  persistant nausea and vomiting   Complete by: As directed    Call MD for:  redness, tenderness, or signs of infection (pain, swelling, redness, odor or green/yellow discharge around incision site)   Complete by: As directed    Call MD for:  severe uncontrolled pain   Complete by: As directed    Call MD for:  temperature >101 F   Complete by: As directed    Diet bariatric full liquid   Complete by: As directed    Incentive spirometry   Complete by: As directed    Perform hourly while awake      Allergies as of 05/03/2021   No Known Allergies      Medication List     TAKE these medications    acetaminophen 500 MG tablet Commonly known as: TYLENOL Take 2 tablets (1,000 mg total) by mouth every 8 (eight) hours for 5 days.   albuterol 108 (90 Base) MCG/ACT inhaler Commonly known as: VENTOLIN HFA Inhale 1-2 puffs into the lungs every 6 (six) hours as needed for wheezing or shortness of breath.   cetirizine 10 MG tablet Commonly known as: ZYRTEC TAKE 1 TABLET (10 MG TOTAL) BY MOUTH EVERY EVENING. FOR ALLERGIES. NOT COVERED What changed: See the new instructions.   fluticasone 50 MCG/ACT nasal spray Commonly known as: FLONASE Place 1 spray into both nostrils 2 (two) times daily as needed for allergies or rhinitis.   ondansetron 4 MG disintegrating tablet Commonly known as: ZOFRAN-ODT Take 1 tablet (4 mg total) by  mouth every 6 (six) hours as needed for nausea or vomiting.   oxyCODONE 5 MG immediate release tablet Commonly known as: Oxy IR/ROXICODONE Take 1 tablet (5 mg total) by mouth every 6 (six) hours as needed for severe pain.   pantoprazole 40 MG tablet Commonly known as: PROTONIX Take 1 tablet (40 mg total) by mouth daily.        Follow-up Information     Stechschulte, Nickola Major, MD. Go on 05/23/2021.   Specialty: Surgery Why: at 2:40pm.  Please arrive 15 minutes prior to your appointment time.  Thank you. Contact  information: [place]. [address] [place] [address] [phone]         Carlena Hurl, PA-C. Go on 06/20/2021.   Specialty: General Surgery Why: at 9:30am for Dr. Thermon Leyland.  Please arrive 15 minutes prior to your appointment time.  Thank you. Contact information: [address] [place] [address] [phone]                  The results of significant diagnostics from this hospitalization (including imaging, microbiology, ancillary and laboratory) are listed below for reference.    Significant Diagnostic Studies: No results found.  Labs: Basic Metabolic Panel: Recent Labs  Lab 05/02/21 2021  CREATININE 0.89   Liver Function Tests: No results for input(s): AST, ALT, ALKPHOS, BILITOT, PROT, ALBUMIN in the last 168 hours.  CBC: Recent Labs  Lab 05/02/21 2021 05/03/21 0928  WBC 9.5 5.1  NEUTROABS  --  3.4  HGB 11.3* 9.6*  HCT 33.7* 29.5*  MCV 89.9 92.2  PLT 263 261    CBG: No results for input(s): GLUCAP in the last 168 hours.  Principal Problem:   Morbid obesity with BMI of 40.0-44.9, adult [facility])     Signed:  [place] [facility], [place] [phone] 05/04/2021, 9:30 AM

## 2021-05-06 ENCOUNTER — Telehealth (HOSPITAL_COMMUNITY): Payer: Self-pay | Admitting: *Deleted

## 2021-05-06 NOTE — Telephone Encounter (Signed)
1.  Tell me about your pain and pain management? Pt denies any current pain. Pt states that she has only had to take one pain pill since discharge.  2.  Let's talk about fluid intake.  How much total fluid are you taking in? Pt states that she is working to meet goal of 64 oz of fluid today.  Pt has been able to consume approx. 62 oz of fluid per day since surgery.  Pt plans to increase clear liquids to meet fluid goals. Pt instructed to assess status and suggestions daily utilizing Hydration Action Plan on discharge folder and to call [facility] if in the "red zone".   3.  How much protein have you taken in the last 2 days? Pt states she is meeting her goal of 60g of protein each day with the protein shakes.  4.  Have you had nausea?  Tell me about when have experienced nausea and what you did to help? Pt denies nausea.   5.  Has the frequency or color changed with your urine? Pt states that she is urinating "fine" with no changes in frequency or urgency.     6.  Tell me what your incisions look like? "Incisions look fine". Pt denies a fever, chills.  Pt states incisions are not swollen, open, or draining.  Pt encouraged to call [facility] if incisions change.   7.  Have you been passing gas? BM? Pt states that she has not had a BM.  Pt instructed to take either Miralax or MoM as instructed per "Gastric Bypass/Sleeve Discharge Home Care Instructions".  Pt to call surgeon's office if not able to have BM with medication.   8.  If a problem or question were to arise who would you call?  Do you know contact numbers for [facility], [facility], and [facility]? Pt denies dehydration symptoms.  Pt can describe s/sx of dehydration.  Pt knows to call [facility] for surgical, [facility] for nutrition, and [facility] for non-urgent questions or concerns.   9.  How has the walking going? Pt states she is walking around and able to be active without difficulty.   10. Are you still using your incentive spirometer?  If so, how often? Pt states that she is  using the I.S. some and practices TCDB.  Pt encouraged to use incentive spirometer, at least 10x every hour while awake until she sees the surgeon.  11.  How are your vitamins and calcium going?  How are you taking them? Pt states that she is taking her supplements and vitamins without difficulty.  Reminded patient that the first 30 days post-operatively are important for successful recovery.  Practice good hand hygiene, wearing a mask when appropriate (since optional in most places), and minimizing exposure to people who live outside of the home, especially if they are exhibiting any respiratory, GI, or illness-like symptoms.

## 2021-05-07 LAB — SURGICAL PATHOLOGY

## 2021-05-20 ENCOUNTER — Encounter: Payer: BC Managed Care – PPO | Attending: Surgery | Admitting: Skilled Nursing Facility1

## 2021-05-20 ENCOUNTER — Other Ambulatory Visit: Payer: Self-pay

## 2021-05-20 DIAGNOSIS — Z6841 Body Mass Index (BMI) 40.0 and over, adult: Secondary | ICD-10-CM | POA: Diagnosis not present

## 2021-05-21 NOTE — Progress Notes (Addendum)
2 Week Post-Operative Nutrition Class   Patient was seen on 05/20/2021  for Post-Operative Nutrition education at the [facility].    Surgery date:  Surgery type: sleeve Start weight at [facility]: 262 Weight today: 236.6 Bowel Habits: Pt states she did have issues with constipation but after enema has had no further issues.   Body Composition Scale 05/20/2021  Current Body Weight 236.6  Total Body Fat % 43  Visceral Fat 13  Fat-Free Mass % 56.9   Total Body Water % 42.9  Muscle-Mass lbs 32.7  BMI 39  Body Fat Displacement          Torso  lbs 63.1         Left Leg  lbs 12.6         Right Leg  lbs 12.6         Left Arm  lbs 6.3         Right Arm   lbs 6.3       The following the learning objectives were met by the patient during this course: Identifies Phase 3 (Soft, High Proteins) Dietary Goals and will begin from 2 weeks post-operatively to 2 months post-operatively Identifies appropriate sources of fluids and proteins  Identifies appropriate fat sources and healthy verses unhealthy fat types   States protein recommendations and appropriate sources post-operatively Identifies the need for appropriate texture modifications, mastication, and bite sizes when consuming solids Identifies appropriate fat consumption and sources Identifies appropriate multivitamin and calcium sources post-operatively Describes the need for physical activity post-operatively and will follow MD recommendations States when to call healthcare provider regarding medication questions or post-operative complications   Handouts given during class include: Phase 3A: Soft, High Protein Diet Handout Phase 3 High Protein Meals Healthy Fats   Follow-Up Plan: Patient will follow-up at [facility] in 6 weeks for 2 month post-op nutrition visit for diet advancement per MD.

## 2021-05-26 ENCOUNTER — Telehealth: Payer: Self-pay | Admitting: Skilled Nursing Facility1

## 2021-05-26 NOTE — Telephone Encounter (Signed)
RD called pt to verify fluid intake once starting soft, solid proteins 2 week post-bariatric surgery.   Daily Fluid intake: 64 oz Daily Protein intake: 60 g Bowel Habits: spoke with her surgeon about this  Concerns/issues:   Pt sates she is dealing with a bit of a learning curve but it is advancing and going well.

## 2021-05-27 ENCOUNTER — Telehealth: Payer: Self-pay

## 2021-05-27 NOTE — Telephone Encounter (Signed)
Transition Care Management Follow-up Telephone Call Date of discharge and from where: 05/03/21 from [facility] How have you been since you were released from the hospital? "Everything has been going well so far" Any questions or concerns? No  Items Reviewed: Did the pt receive and understand the discharge instructions provided? Yes  Medications obtained and verified? Yes  Other? No  Any new allergies since your discharge? No  Dietary orders reviewed? Yes Do you have support at home? Yes   Home Care and Equipment/Supplies: Were home health services ordered? not applicable If so, what is the name of the agency? Not applicable  Has the agency set up a time to come to the patient's home? not applicable Were any new equipment or medical supplies ordered?  No What is the name of the medical supply agency? Not applicable  Were you able to get the supplies/equipment? not applicable Do you have any questions related to the use of the equipment or supplies? No  Functional Questionnaire: (I = Independent and D = Dependent) ADLs: I  Bathing/Dressing- I  Meal Prep- I  Eating- I  Maintaining continence- I  Transferring/Ambulation- I  Managing Meds- I  Follow up appointments reviewed:  PC[facility] Hospital f/u appt confirmed?  Following up with specialist.    [facility] f/u appt confirmed? Has seen the Nutritionist on 05/20/21 and the surgeon on 05/23/21. Are transportation arrangements needed? No  If their condition worsens, is the pt aware to call PCP or go to the Emergency Dept.? Yes Was the patient provided with contact information for the PCP's office or ED? Yes Was to pt encouraged to call back with questions or concerns? Yes   Thea Silversmith, RN, MSN, BSN, [place] Care Management Coordinator [phone]

## 2021-07-02 ENCOUNTER — Other Ambulatory Visit: Payer: Self-pay

## 2021-07-02 ENCOUNTER — Encounter: Payer: BC Managed Care – PPO | Attending: Surgery | Admitting: Skilled Nursing Facility1

## 2021-07-02 DIAGNOSIS — Z6841 Body Mass Index (BMI) 40.0 and over, adult: Secondary | ICD-10-CM | POA: Insufficient documentation

## 2021-07-02 NOTE — Progress Notes (Signed)
Bariatric Nutrition Follow-Up Visit Medical Nutrition Therapy    NUTRITION ASSESSMENT   Surgery date: 05/02/2022 Surgery type: sleeve Start weight at [facility]: 262 Weight today: 230 pounds  Body Composition Scale 05/20/2021 07/02/2021  Current Body Weight 236.6 230  Total Body Fat % 43 42  Visceral Fat 13 13  Fat-Free Mass % 56.9 57.9   Total Body Water % 42.9 43.4  Muscle-Mass lbs 32.7 32.9  BMI 39 37.5  Body Fat Displacement           Torso  lbs 63.1 59.9         Left Leg  lbs 12.6 11.9         Right Leg  lbs 12.6 11.9         Left Arm  lbs 6.3 5.9         Right Arm   lbs 6.3 5.9   Clinical  Medical hx: prediabetes, Asthma  Medications: see list  Labs: A1C 6.0 Notable signs/symptoms: some back pain Any previous deficiencies? No   Lifestyle & Dietary Hx  Pt states she was prescribed linzess due to chronic constipation pt states her surgeon advised the addition of fruit and vegetables.   Pt state she got a motivational water bottle which helps her to drink the appropriate amount.  Pt states she workout with her friends.   Estimated daily fluid intake: 64 oz Estimated daily protein intake: 60 g Supplements: multi and calcium  Current average weekly physical activity: treadmill with inclines and speed intervals,  upper body weights   24-Hr Dietary Recall First Meal: 2 eggs + cheese stick + [place] bacon or sausage Snack: 8-10 macadamia Second Meal: chicken or beans Snack:   Third Meal: [place] Snack:  Beverages: 12 ounce coffee + splenda + flavored creamer, water, diet green tea  Post-Op Goals/ Signs/ Symptoms Using straws: no Drinking while eating: no Chewing/swallowing difficulties: no Changes in vision: no Changes to mood/headaches: no Hair loss/changes to skin/nails: no Difficulty focusing/concentrating: no Sweating: no Limb weakness: no Dizziness/lightheadedness: no Palpitations: no  Carbonated/caffeinated beverages: no N/V/D/C/Gas: yes Abdominal pain:  no Dumping syndrome: no    NUTRITION DIAGNOSIS  Overweight/obesity ([place]-3.3) related to past poor dietary habits and physical inactivity as evidenced by completed bariatric surgery and following dietary guidelines for continued weight loss and healthy nutrition status.     NUTRITION INTERVENTION Nutrition counseling (C-1) and education (E-2) to facilitate bariatric surgery goals, including: Diet advancement to the next phase (phase 4) now including non starchy vegetables The importance of consuming adequate calories as well as certain nutrients daily due to the body's need for essential vitamins, minerals, and fats The importance of daily physical activity and to reach a goal of at least 150 minutes of moderate to vigorous physical activity weekly (or as directed by their physician) due to benefits such as increased musculature and improved lab values The importance of intuitive eating specifically learning hunger-satiety cues and understanding the importance of learning a new body: The importance of mindful eating to avoid grazing behaviors  Importance of vegetables To have an overall healthy diet, adult men and women are recommended to consume anywhere from 2-3 cups of vegetables daily. Vegetables provide a wide range of vitamins and minerals such as vitamin A, vitamin C, potassium, and folic acid. According to the [facility] [employer], including fruit and vegetables daily may reduce the risk of cardiovascular disease, certain cancers, and other non-communicable diseases.  Goals: -Continue to aim for a minimum of 64 fluid ounces  7 days a week with at least 30 ounces being plain water  -Eat non-starchy vegetables 2 times a day 7 days a week  -Start out with soft cooked vegetables today and tomorrow; if tolerated begin to eat raw vegetables or cooked including salads  -Eat your 3 ounces of protein first then start in on your non-starchy vegetables; once you understand how much of your  meal leads to satisfaction and not full while still eating 3 ounces of protein and non-starchy vegetables you can eat them in any order   -Continue to aim for 30 minutes of activity at least 5 times a week  -Do NOT cook with/add to your food: alfredo sauce, cheese sauce, barbeque sauce, ketchup, fat back, butter, bacon grease, grease, Crisco, OR SUGAR  -limit fruit to 2 servings per day  Handouts Provided Include  Phase 4 and fruit  Learning Style & Readiness for Change Teaching method utilized: Visual & Auditory  Demonstrated degree of understanding via: Teach Back  Readiness Level: action Barriers to learning/adherence to lifestyle change: none identified   RD's Notes for Next Visit Assess adherence to pt chosen goals    MONITORING & EVALUATION Dietary intake, weekly physical activity, body weight  Next Steps Patient is to follow-up in 3 months

## 2021-07-03 ENCOUNTER — Other Ambulatory Visit: Payer: Self-pay | Admitting: Primary Care

## 2021-07-03 DIAGNOSIS — Z1231 Encounter for screening mammogram for malignant neoplasm of breast: Secondary | ICD-10-CM

## 2021-07-07 DIAGNOSIS — Z1231 Encounter for screening mammogram for malignant neoplasm of breast: Secondary | ICD-10-CM

## 2021-07-08 ENCOUNTER — Other Ambulatory Visit: Payer: Self-pay | Admitting: Primary Care

## 2021-07-08 ENCOUNTER — Ambulatory Visit
Admission: RE | Admit: 2021-07-08 | Discharge: 2021-07-08 | Disposition: A | Discharge status: Home / Self Care | Payer: BC Managed Care – PPO | Source: Ambulatory Visit | Attending: Primary Care | Admitting: Primary Care

## 2021-07-08 DIAGNOSIS — R928 Other abnormal and inconclusive findings on diagnostic imaging of breast: Secondary | ICD-10-CM

## 2021-07-08 DIAGNOSIS — Z1231 Encounter for screening mammogram for malignant neoplasm of breast: Secondary | ICD-10-CM

## 2021-07-11 ENCOUNTER — Other Ambulatory Visit: Payer: Self-pay | Admitting: Primary Care

## 2021-07-11 DIAGNOSIS — J3089 Other allergic rhinitis: Secondary | ICD-10-CM

## 2021-07-21 ENCOUNTER — Ambulatory Visit
Admission: RE | Admit: 2021-07-21 | Discharge: 2021-07-21 | Disposition: A | Discharge status: Home / Self Care | Payer: BC Managed Care – PPO | Source: Ambulatory Visit | Attending: Primary Care | Admitting: Primary Care

## 2021-07-21 ENCOUNTER — Other Ambulatory Visit: Payer: Self-pay | Admitting: Primary Care

## 2021-07-21 DIAGNOSIS — R928 Other abnormal and inconclusive findings on diagnostic imaging of breast: Secondary | ICD-10-CM

## 2021-07-21 DIAGNOSIS — R922 Inconclusive mammogram: Secondary | ICD-10-CM | POA: Diagnosis not present

## 2021-07-21 DIAGNOSIS — N6320 Unspecified lump in the left breast, unspecified quadrant: Secondary | ICD-10-CM

## 2021-07-21 DIAGNOSIS — N6322 Unspecified lump in the left breast, upper inner quadrant: Secondary | ICD-10-CM | POA: Diagnosis not present

## 2021-07-23 DIAGNOSIS — C50812 Malignant neoplasm of overlapping sites of left female breast: Secondary | ICD-10-CM

## 2021-07-23 HISTORY — DX: Estrogen receptor positive status (ER+): C50.812

## 2021-07-29 DIAGNOSIS — C801 Malignant (primary) neoplasm, unspecified: Secondary | ICD-10-CM

## 2021-07-29 HISTORY — DX: Malignant (primary) neoplasm, unspecified: C80.1

## 2021-07-30 ENCOUNTER — Ambulatory Visit
Admission: RE | Admit: 2021-07-30 | Discharge: 2021-07-30 | Disposition: A | Discharge status: Home / Self Care | Payer: BC Managed Care – PPO | Source: Ambulatory Visit | Attending: Primary Care | Admitting: Primary Care

## 2021-07-30 DIAGNOSIS — C50212 Malignant neoplasm of upper-inner quadrant of left female breast: Secondary | ICD-10-CM | POA: Diagnosis not present

## 2021-07-30 DIAGNOSIS — C50812 Malignant neoplasm of overlapping sites of left female breast: Secondary | ICD-10-CM | POA: Diagnosis not present

## 2021-07-30 DIAGNOSIS — N6320 Unspecified lump in the left breast, unspecified quadrant: Secondary | ICD-10-CM

## 2021-07-30 HISTORY — PX: BREAST BIOPSY: SHX20

## 2021-08-08 ENCOUNTER — Ambulatory Visit: Payer: Self-pay | Admitting: Surgery

## 2021-08-08 DIAGNOSIS — C50912 Malignant neoplasm of unspecified site of left female breast: Secondary | ICD-10-CM

## 2021-08-08 DIAGNOSIS — Z170 Estrogen receptor positive status [ER+]: Secondary | ICD-10-CM | POA: Diagnosis not present

## 2021-08-08 DIAGNOSIS — C50412 Malignant neoplasm of upper-outer quadrant of left female breast: Secondary | ICD-10-CM | POA: Diagnosis not present

## 2021-08-11 ENCOUNTER — Telehealth: Payer: Self-pay | Admitting: Hematology and Oncology

## 2021-08-11 ENCOUNTER — Other Ambulatory Visit: Payer: Self-pay | Admitting: *Deleted

## 2021-08-11 DIAGNOSIS — C50812 Malignant neoplasm of overlapping sites of left female breast: Secondary | ICD-10-CM

## 2021-08-11 DIAGNOSIS — Z853 Personal history of malignant neoplasm of breast: Secondary | ICD-10-CM | POA: Insufficient documentation

## 2021-08-11 NOTE — Progress Notes (Signed)
?Radiation Oncology         (336) [phone] ?________________________________ ? ?Name: Nichole [name]        MRN: [id number]  ?Date of Service: 08/14/2021 DOB: 1975-07-22 ? ?[id number], Leticia Penna, NP  Erroll Luna, MD    ? ?REFERRING PHYSICIAN: Erroll Luna, MD ? ? ?DIAGNOSIS: The encounter diagnosis was Malignant neoplasm of overlapping sites of left breast in female, estrogen receptor positive ([place]). ? ? ?HISTORY OF PRESENT ILLNESS: Nichole [name] is a 46 y.o. female seen for new diagnosis of left breast cancer.  The patient presented for screening mammogram and 2 abnormalities were found by diagnostic imaging in the left breast, at 12:00 a 2 cm lesion was noted and at 11:00 a 6 mm satellite lesion was noted.  The left axilla was negative for adenopathy and biopsies performed on 07/30/2021 showed similar findings in both specimens consistent with grade 2 invasive lobular carcinoma, each of the cancers was ER/PR positive, HER2 negative with Ki-67 both less than 5%.  Her case was discussed in multidisciplinary breast oncology conference, recommendations were made for her to have an MRI followed by bracketed lumpectomy. ? ? ?PREVIOUS RADIATION THERAPY: No ? ? ?PAST MEDICAL HISTORY:  ?Past Medical History:  ?Diagnosis Date  ? Asthma   ? exercise induced asthma  ? Pre-diabetes   ?   ? ? ?PAST SURGICAL HISTORY: ?Past Surgical History:  ?Procedure Laterality Date  ? BIOPSY  03/04/2021  ? Procedure: BIOPSY;  Surgeon: Felicie Morn, MD;  Location: [facility];  Service: General;;  ? BREAST BIOPSY Left 07/30/2021  ? ESOPHAGOGASTRODUODENOSCOPY N/A 03/04/2021  ? Procedure: ESOPHAGOGASTRODUODENOSCOPY ([facility]GD);  Surgeon: Felicie Morn, MD;  Location: [facility];  Service: General;  Laterality: N/A;  ? LAPAROSCOPIC GASTRIC SLEEVE RESECTION N/A 05/02/2021  ? Procedure: LAPAROSCOPIC GASTRIC SLEEVE RESECTION;  Surgeon: Felicie Morn, MD;  Location: [facility];  Service: General;  Laterality: N/A;  ?  LAPAROSCOPIC VAGINAL HYSTERECTOMY WITH SALPINGECTOMY N/A 10/08/2016  ? Procedure: LAPAROSCOPIC ASSISTED VAGINAL HYSTERECTOMY WITH RIGHT SALPINGECTOMY LYSIS OF ADHESIONS;  Surgeon: Donnamae Jude, MD;  Location: [facility];  Service: Gynecology;  Laterality: N/A;  ? MYOMECTOMY  2014  ? Right Oophrectomy    ? UPPER GI ENDOSCOPY N/A 05/02/2021  ? Procedure: UPPER GI ENDOSCOPY;  Surgeon: Felicie Morn, MD;  Location: [facility];  Service: General;  Laterality: N/A;  ? WISDOM TOOTH EXTRACTION    ? ? ? ?FAMILY HISTORY:  ?Family History  ?Problem Relation Age of Onset  ? Arthritis Mother   ? Breast cancer Mother 32  ?     Lumpectomy  ? Hyperlipidemia Mother   ? Hypertension Mother   ? Kidney cancer Mother 81  ?     Had ablasion  ? Graves' disease Mother   ?     Had thyroid removed  ? Graves' disease Brother   ?     Deceased  ? ? ? ?SOCIAL HISTORY:  reports that she quit smoking about 4 years ago. Her smoking use included cigarettes. She has a 1.50 pack-year smoking history. She has never used smokeless tobacco. She reports that she does not currently use alcohol. She reports that she does not currently use drugs after having used the following drugs: Marijuana.  The patient is divorced and lives in [place], [place]. 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 She has a 16 year old son Harrell Gave. ? ? ?ALLERGIES: Patient has no known allergies. ? ? ?MEDICATIONS:  ?  Current Outpatient Medications  ?Medication Sig Dispense Refill  ? albuterol (VENTOLIN HFA) 108 (90 Base) MCG/ACT inhaler Inhale 1-2 puffs into the lungs every 6 (six) hours as needed for wheezing or shortness of breath. 1 each 0  ? cetirizine (ZYRTEC) 10 MG tablet Take 1 tablet (10 mg total) by mouth at bedtime. For allergies 90 tablet 0  ? fluticasone (FLONASE) 50 MCG/ACT nasal spray Place 1 spray into both nostrils 2 (two) times daily as needed for allergies or rhinitis. 48 mL 0  ? ondansetron (ZOFRAN-ODT) 4 MG disintegrating  tablet Take 1 tablet (4 mg total) by mouth every 6 (six) hours as needed for nausea or vomiting. 20 tablet 0  ? oxyCODONE (OXY IR/ROXICODONE) 5 MG immediate release tablet Take 1 tablet (5 mg total) by mouth every 6 (six) hours as needed for severe pain. 10 tablet 0  ? pantoprazole (PROTONIX) 40 MG tablet Take 1 tablet (40 mg total) by mouth daily. 90 tablet 0  ? ?No current facility-administered medications for this visit.  ? ? ? ?REVIEW OF SYSTEMS: On review of systems, the patient reports that she is doing well overall. She denies any breast specific complaints. ? ?  ? ?PHYSICAL EXAM:  ?Wt Readings from Last 3 Encounters:  ?07/02/21 230 lb (104.3 kg)  ?05/02/21 250 lb 9.6 oz (113.7 kg)  ?04/25/21 252 lb (114.3 kg)  ? ?Temp Readings from Last 3 Encounters:  ?05/03/21 98.5 ?F (36.9 ?C) (Oral)  ?04/25/21 98.4 ?F (36.9 ?C) (Oral)  ?03/04/21 97.7 ?F (36.5 ?C) (Oral)  ? ?BP Readings from Last 3 Encounters:  ?05/03/21 (!) 147/82  ?04/25/21 (!) 145/88  ?03/04/21 (!) 145/85  ? ?Pulse Readings from Last 3 Encounters:  ?05/03/21 95  ?04/25/21 66  ?03/04/21 84  ? ? ?In general this is a well appearing African American female in no acute distress. She's alert and oriented x4 and appropriate throughout the examination. Cardiopulmonary assessment is negative for acute distress and she exhibits normal effort. Bilateral breast exam is deferred. ? ? ? ?ECOG = 0 ? ?0 - Asymptomatic (Fully active, able to carry on all predisease activities without restriction) ? ?1 - Symptomatic but completely ambulatory (Restricted in physically strenuous activity but ambulatory and able to carry out work of a light or sedentary nature. For example, light housework, office work) ? ?2 - Symptomatic, <50% in bed during the day (Ambulatory and capable of all self care but unable to carry out any work activities. Up and about more than 50% of waking hours) ? ?3 - Symptomatic, >50% in bed, but not bedbound (Capable of only limited self-care, confined to  bed or chair 50% or more of waking hours) ? ?4 - Bedbound (Completely disabled. Cannot carry on any self-care. Totally confined to bed or chair) ? ?5 - Death ? ? Oken MM, Creech RH, Tormey DC, et al. [phone]). "Toxicity and response criteria of the [facility]". [place] 5 (6): 649-55 ? ? ? ?LABORATORY DATA:  ?Lab Results  ?Component Value Date  ? WBC 5.1 05/03/2021  ? HGB 9.6 (L) 05/03/2021  ? HCT 29.5 (L) 05/03/2021  ? MCV 92.2 05/03/2021  ? PLT 261 05/03/2021  ? ?Lab Results  ?Component Value Date  ? NA 136 04/25/2021  ? K 3.6 04/25/2021  ? CL 106 04/25/2021  ? CO2 23 04/25/2021  ? ?Lab Results  ?Component Value Date  ? ALT 18 04/25/2021  ? AST 21 04/25/2021  ? ALKPHOS 57 04/25/2021  ?  BILITOT 0.8 04/25/2021  ? ?  ? ?RADIOGRAPHY: US BREAST LTD UNI LEFT INC AXILLA ? ?Result Date: 07/21/2021 ?CLINICAL DATA:  Patient recalled from screening for left breast mass. EXAM: DIGITAL DIAGNOSTIC UNILATERAL LEFT MAMMOGRAM WITH TOMOSYNTHESIS AND CAD; ULTRASOUND LEFT BREAST LIMITED TECHNIQUE: Left digital diagnostic mammography and breast tomosynthesis was performed. The images were evaluated with computer-aided detection.; Targeted ultrasound examination of the left breast was performed. COMPARISON:  Previous exam(s). ACR Breast Density Category c: The breast tissue is heterogeneously dense, which may obscure small masses. FINDINGS: Within the anterior left breast slightly medially there is a persistent spiculated mass with associated calcifications, further evaluated with additional imaging. On physical exam, there is a palpable mass within the superior left breast. Targeted ultrasound is performed, showing a 16 x 20 x 12 mm irregular hypoechoic mass left breast 12 o'clock position 4 cm from the nipple. There is an adjacent satellite nodule within the left breast 11 o'clock position 3 cm from nipple measuring 4 x 3 x 6 mm. No enlarged left axillary lymphadenopathy. IMPRESSION: 1. Suspicious left  breast mass 12 o'clock position. 2. Suspicious satellite nodule left breast 11 o'clock position. RECOMMENDATION: 1. Ultrasound-guided core needle biopsy of the left breast mass 12 o'clock position and adjacent

## 2021-08-11 NOTE — Telephone Encounter (Signed)
Scheduled appt per 3/20 staff msg from nurse navigator. Pt is aware of appt date and time. Pt is aware to arrive 15 mins prior to appt time and to bring and updated insurance card. Pt is aware of appt location.   ?

## 2021-08-13 ENCOUNTER — Inpatient Hospital Stay: Payer: BC Managed Care – PPO

## 2021-08-13 ENCOUNTER — Encounter: Payer: Self-pay | Admitting: *Deleted

## 2021-08-13 ENCOUNTER — Encounter: Payer: Self-pay | Admitting: Hematology and Oncology

## 2021-08-13 ENCOUNTER — Inpatient Hospital Stay: Payer: BC Managed Care – PPO | Attending: Hematology and Oncology | Admitting: Hematology and Oncology

## 2021-08-13 ENCOUNTER — Inpatient Hospital Stay (HOSPITAL_BASED_OUTPATIENT_CLINIC_OR_DEPARTMENT_OTHER): Payer: BC Managed Care – PPO | Admitting: Genetic Counselor

## 2021-08-13 ENCOUNTER — Other Ambulatory Visit: Payer: Self-pay

## 2021-08-13 ENCOUNTER — Inpatient Hospital Stay: Payer: BC Managed Care – PPO | Admitting: Genetic Counselor

## 2021-08-13 DIAGNOSIS — Z170 Estrogen receptor positive status [ER+]: Secondary | ICD-10-CM

## 2021-08-13 DIAGNOSIS — C50812 Malignant neoplasm of overlapping sites of left female breast: Secondary | ICD-10-CM | POA: Insufficient documentation

## 2021-08-13 DIAGNOSIS — Z87891 Personal history of nicotine dependence: Secondary | ICD-10-CM | POA: Diagnosis not present

## 2021-08-13 DIAGNOSIS — Z8051 Family history of malignant neoplasm of kidney: Secondary | ICD-10-CM | POA: Diagnosis not present

## 2021-08-13 DIAGNOSIS — Z803 Family history of malignant neoplasm of breast: Secondary | ICD-10-CM | POA: Insufficient documentation

## 2021-08-13 LAB — RESEARCH LABS

## 2021-08-13 LAB — CBC WITH DIFFERENTIAL/PLATELET
Abs Immature Granulocytes: 0.01 10*3/uL (ref 0.00–0.07)
Basophils Absolute: 0 10*3/uL (ref 0.0–0.1)
Basophils Relative: 0 %
Eosinophils Absolute: 0.1 10*3/uL (ref 0.0–0.5)
Eosinophils Relative: 3 %
HCT: 36.7 % (ref 36.0–46.0)
Hemoglobin: 12.1 g/dL (ref 12.0–15.0)
Immature Granulocytes: 0 %
Lymphocytes Relative: 52 %
Lymphs Abs: 1.8 10*3/uL (ref 0.7–4.0)
MCH: 29.7 pg (ref 26.0–34.0)
MCHC: 33 g/dL (ref 30.0–36.0)
MCV: 90.2 fL (ref 80.0–100.0)
Monocytes Absolute: 0.2 10*3/uL (ref 0.1–1.0)
Monocytes Relative: 6 %
Neutro Abs: 1.3 10*3/uL — ABNORMAL LOW (ref 1.7–7.7)
Neutrophils Relative %: 39 %
Platelets: 296 10*3/uL (ref 150–400)
RBC: 4.07 MIL/uL (ref 3.87–5.11)
RDW: 12.9 % (ref 11.5–15.5)
WBC: 3.5 10*3/uL — ABNORMAL LOW (ref 4.0–10.5)
nRBC: 0 % (ref 0.0–0.2)

## 2021-08-13 LAB — CMP (CANCER CENTER ONLY)
ALT: 14 U/L (ref 0–44)
AST: 14 U/L — ABNORMAL LOW (ref 15–41)
Albumin: 4.4 g/dL (ref 3.5–5.0)
Alkaline Phosphatase: 78 U/L (ref 38–126)
Anion gap: 6 (ref 5–15)
BUN: 15 mg/dL (ref 6–20)
CO2: 25 mmol/L (ref 22–32)
Calcium: 9.5 mg/dL (ref 8.9–10.3)
Chloride: 107 mmol/L (ref 98–111)
Creatinine: 0.87 mg/dL (ref 0.44–1.00)
GFR, Estimated: >60 mL/min (ref >60)
Glucose, Bld: 96 mg/dL (ref 70–99)
Potassium: 3.7 mmol/L (ref 3.5–5.1)
Sodium: 138 mmol/L (ref 135–145)
Total Bilirubin: 0.5 mg/dL (ref 0.3–1.2)
Total Protein: 8.1 g/dL (ref 6.5–8.1)

## 2021-08-13 NOTE — Progress Notes (Signed)
[place] ?CONSULT NOTE ? ?Patient Care Team: ?Pleas Koch, NP as PCP - General (Nurse Practitioner) ? ?CHIEF COMPLAINTS/PURPOSE OF CONSULTATION:  ?Newly diagnosed breast cancer ? ?HISTORY OF PRESENTING ILLNESS:  ?Nichole [name] 46 y.o. female is here because of recent diagnosis of left breast IDC ?Patient states she felt some abnormality in her left breast in December, she does not think this has grown.  She also has a family history of breast cancer in mom who had it in 2s. ? ?Screening mammogram 07/08/2021 showed possible mass with distortion in the left breast. ?07/21/2021 she had a left diagnostic mammogram which showed a suspicious left breast mass at 12 o'clock position suspicious satellite nodule left breast at 11 o'clock position ?Ultrasound same day showed a 16 x 20 x 12 mm irregular hypoechoic mass left breast at 12 o'clock position 4 cm from the nipple.  There is an adjacent satellite nodule within the left breast 11 o'clock position 3 cm from nipple measuring 4 x 3 x 6 mm.  No enlarged left axillary lymphadenopathy ?Pathology from left breast mass 11:00, 3 cm from nipple showed invasive mammary carcinoma grade 2 prognostics ER 50% moderate staining, PR 95% strong staining, Ki-67 less than 5% and HER2 negative.  The second mass at 11:00 also showed similar prognostics and is grade 2. ?She has already met with Dr. Brantley Stage from breast surgery and is here for an initial visit.   ?She is scheduled to see Dr. Lisbeth Renshaw from radiation oncology next week ? ?Rest of the pertinent 10 point ROS reviewed and negative ?I reviewed her records extensively and collaborated the history with the patient. ? ?SUMMARY OF ONCOLOGIC HISTORY: ?Oncology History  ? No history exists.  ? ?Age at first child birth : 80 ? ? ?MEDICAL HISTORY:  ?Past Medical History:  ?Diagnosis Date  ? Asthma   ? exercise induced asthma  ? Pre-diabetes   ? ? ?SURGICAL HISTORY: ?Past Surgical History:  ?Procedure Laterality Date   ? BIOPSY  03/04/2021  ? Procedure: BIOPSY;  Surgeon: Felicie Morn, MD;  Location: [facility];  Service: General;;  ? BREAST BIOPSY Left 07/30/2021  ? ESOPHAGOGASTRODUODENOSCOPY N/A 03/04/2021  ? Procedure: ESOPHAGOGASTRODUODENOSCOPY (EGD);  Surgeon: Felicie Morn, MD;  Location: [facility];  Service: General;  Laterality: N/A;  ? LAPAROSCOPIC GASTRIC SLEEVE RESECTION N/A 05/02/2021  ? Procedure: LAPAROSCOPIC GASTRIC SLEEVE RESECTION;  Surgeon: Felicie Morn, MD;  Location: [facility];  Service: General;  Laterality: N/A;  ? LAPAROSCOPIC VAGINAL HYSTERECTOMY WITH SALPINGECTOMY N/A 10/08/2016  ? Procedure: LAPAROSCOPIC ASSISTED VAGINAL HYSTERECTOMY WITH RIGHT SALPINGECTOMY LYSIS OF ADHESIONS;  Surgeon: Donnamae Jude, MD;  Location: [facility];  Service: Gynecology;  Laterality: N/A;  ? MYOMECTOMY  2014  ? Right Oophrectomy    ? UPPER GI ENDOSCOPY N/A 05/02/2021  ? Procedure: UPPER GI ENDOSCOPY;  Surgeon: Felicie Morn, MD;  Location: [facility];  Service: General;  Laterality: N/A;  ? WISDOM TOOTH EXTRACTION    ? ? ?SOCIAL HISTORY: ?Social History  ? ?Socioeconomic History  ? Marital status: Divorced  ?  Spouse name: Not on file  ? Number of children: Not on file  ? Years of education: Not on file  ? Highest education level: Not on file  ?Occupational History  ? Not on file  ?Tobacco Use  ? Smoking status: Former  ?  Packs/day: 0.10  ?  Years: 15.00  ?  Pack years: 1.50  ?  Types: Cigarettes  ?  Quit date: 01/23/2017  ?  Years since quitting: 4.5  ? Smokeless tobacco: Never  ?Vaping Use  ? Vaping Use: Never used  ?Substance and Sexual Activity  ? Alcohol use: Not Currently  ?  Comment: none in last year per pt  ? Drug use: Not Currently  ?  Types: Marijuana  ?  Comment: none in 20 years  ? Sexual activity: Not Currently  ?  Birth control/protection: None  ?Other Topics Concern  ? Not on file  ?Social History Narrative  ? Work as a Marine scientist at [employer].  ? 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  ? Had  70 year old son.  ? Moved from [place] in Sept. 2015  ? ?Social Determinants of Health  ? ?Financial Resource Strain: Not on file  ?Food Insecurity: Not on file  ?Transportation Needs: Not on file  ?Physical Activity: Not on file  ?Stress: Not on file  ?Social Connections: Not on file  ?Intimate Partner Violence: Not on file  ? ? ?FAMILY HISTORY: ?Family History  ?Problem Relation Age of Onset  ? Arthritis Mother   ? Breast cancer Mother 60  ?     Lumpectomy  ? Hyperlipidemia Mother   ? Hypertension Mother   ? Kidney cancer Mother 21  ?     Had ablasion  ? Graves' disease Mother   ?     Had thyroid removed  ? Graves' disease Brother   ?     Deceased  ? ? ?ALLERGIES:  has No Known Allergies. ? ?MEDICATIONS:  ?Current Outpatient Medications  ?Medication Sig Dispense Refill  ? albuterol (VENTOLIN HFA) 108 (90 Base) MCG/ACT inhaler Inhale 1-2 puffs into the lungs every 6 (six) hours as needed for wheezing or shortness of breath. 1 each 0  ? cetirizine (ZYRTEC) 10 MG tablet Take 1 tablet (10 mg total) by mouth at bedtime. For allergies 90 tablet 0  ? fluticasone (FLONASE) 50 MCG/ACT nasal spray Place 1 spray into both nostrils 2 (two) times daily as needed for allergies or rhinitis. 48 mL 0  ? ondansetron (ZOFRAN-ODT) 4 MG disintegrating tablet Take 1 tablet (4 mg total) by mouth every 6 (six) hours as needed for nausea or vomiting. 20 tablet 0  ? oxyCODONE (OXY IR/ROXICODONE) 5 MG immediate release tablet Take 1 tablet (5 mg total) by mouth every 6 (six) hours as needed for severe pain. 10 tablet 0  ? pantoprazole (PROTONIX) 40 MG tablet Take 1 tablet (40 mg total) by mouth daily. 90 tablet 0  ? ?No current facility-administered medications for this visit.  ? ?REVIEW OF SYSTEMS:   ?Constitutional: Denies fevers, chills or abnormal night sweats ?Eyes: Denies blurriness of vision, double vision or watery eyes ?Ears, nose, mouth, throat, and face: Denies mucositis or sore throat ?Respiratory: Denies cough, dyspnea or  wheezes ?Cardiovascular: Denies palpitation, chest discomfort or lower extremity swelling ?Gastrointestinal:  Denies nausea, heartburn or change in bowel habits ?Skin: Denies abnormal skin rashes ?Lymphatics: Denies new lymphadenopathy or easy bruising ?Neurological:Denies numbness, tingling or new weaknesses ?Behavioral/Psych: Mood is stable, no new changes  ?Breast: She felt palpable breast mass in December ?All other systems were reviewed with the patient and are negative. ? ?PHYSICAL EXAMINATION: ?ECOG PERFORMANCE STATUS: 0 - Asymptomatic ? ?Vitals:  ? 08/13/21 1100  ?BP: 125/69  ?Pulse: 71  ?Resp: 16  ?Temp: 97.7 ?F (36.5 ?C)  ?SpO2: 99%  ? ?Filed Weights  ? 08/13/21 1100  ?Weight: 220 lb 1.6 oz (99.8 kg)  ? ? ?  GENERAL:alert, no distress and comfortable ?SKIN: skin color, texture, turgor are normal, no rashes or significant lesions ?EYES: normal, conjunctiva are pink and non-injected, sclera clear ?OROPHARYNX:no exudate, no erythema and lips, buccal mucosa, and tongue normal  ?NECK: supple, thyroid normal size, non-tender, without nodularity ?LYMPH:  no palpable lymphadenopathy in the cervical, axillary or inguinal ?LUNGS: clear to auscultation and percussion with normal breathing effort ?HEART: regular rate & rhythm and no murmurs and no lower extremity edema ?ABDOMEN:abdomen soft, non-tender and normal bowel sounds ?Musculoskeletal:no cyanosis of digits and no clubbing  ?PSYCH: alert & oriented x 3 with fluent speech ?NEURO: no focal motor/sensory deficits ?BREAST: Palpable breast abnormality in the left breast at around 12:00 however the size is not well-defined.  I could not really feel a satellite nodule at 11:00.  No palpable regional adenopathy ? ?LABORATORY DATA:  ?I have reviewed the data as listed ?Lab Results  ?Component Value Date  ? WBC 5.1 05/03/2021  ? HGB 9.6 (L) 05/03/2021  ? HCT 29.5 (L) 05/03/2021  ? MCV 92.2 05/03/2021  ? PLT 261 05/03/2021  ? ?Lab Results  ?Component Value Date  ? NA 136  04/25/2021  ? K 3.6 04/25/2021  ? CL 106 04/25/2021  ? CO2 23 04/25/2021  ? ? ?RADIOGRAPHIC STUDIES: ?I have personally reviewed the radiological reports and agreed with the findings in the report. ?

## 2021-08-13 NOTE — Research (Signed)
[employer] 2021-05 - Specimen Collection Study to Evaluate Biomarkers in Subjects with Cancer  ? ?This Nurse has reviewed this patient's inclusion and exclusion criteria and confirmed Nichole [name] is eligible for study participation.  Patient will continue with enrollment. ? ?Investigator agrees with enrollment. ? ?This patient denies past medical history of HTN, CAD, SLE, RA, DM, & Lynch sydrome.  This patient is not taking magnesium supplements.  The patient does report family history of cancer in 1st or 2nd degree relatives.  (Mother had breast ca)  The patient reports rare history of alcohol consumption.  The patient reports on and off hx of tobacco use, approx 1 pack per week for about 15 years; quit 3 or 4 years ago. ? ?No personal hx ca.  ? ?Nichole Skiff Roddrick Sharron, RN, BSN, CHPN ?She  Her  Hers ?Clinical Research Nurse ?[place] ?Direct Dial [phone]  Pager [phone] ?08/13/2021 1:41 PM ?

## 2021-08-13 NOTE — Progress Notes (Signed)
New Breast Cancer Diagnosis: Left Breast- Upper Central and Inner Quadrant ? ?Did patient present with symptoms (if so, please note symptoms) or screening mammography?:Patient reports she felt some fullness in her left breast, felt more firm.  She was sent for work up which included a mammogram.   ? ?MRI Breast: Unscheduled at this time. ? ?Location and Extent of disease :left breast. Located at 12 o'clock position a 2 cm lesion was noted and at the 11 o'clock position a 6 mm satellite lesion was noted.  Adenopathy no. ? ?Histology per Pathology Report: grade 2, Invasive Lobular Carcinoma 07/30/2021 ? ?Receptor Status: ER(positive), PR (positive), Her2-neu (negative), Ki-(<5%) ? ? ?Surgeon and surgical plan, if any:  ?Dr. Brantley Stage 08/08/2021 ?-We will obtain MRI first. ?-She would like to conserve her breast and discussed left breast lumpectomy using 2 seeds with SLN mapping. ?-We will refer to physical therapy. ?-Left Breast Lumpectomy with radioactive seed and SLN biopsy 09/11/2021 ? ?Medical oncologist, treatment if any:   ?Dr. Chryl Heck 08/13/2021 ?-Given the intermediate grade, strong ER/PR positivity, low proliferation index and HER2 negative tumor, we discussed about upfront surgery followed by consideration for Oncotype testing.  ?-I believe we should send Oncotype testing on the larger tumor sample at 12 o'clock position. ?-If chemotherapy is needed, this will precede radiation and then after radiation she will continue on antiestrogen therapy. ?- If she does not require adjuvant chemotherapy, she will proceed with radiation followed by adjuvant antiestrogen therapy. ?-Follow-up appointment 09/16/2021 ? ?Family History of Breast/Ovarian/Prostate Cancer: Mom had breast cancer in her 4's. ? ? ?Lymphedema issues, if any: No    ? ?Pain issues, if any: No    ? ?SAFETY ISSUES: ?Prior radiation? No ?Pacemaker/ICD? No ?Possible current pregnancy? Hysterectomy  ?Is the patient on methotrexate? No ? ?Current Complaints / other  details:   ?Genetics:  ?

## 2021-08-13 NOTE — Research (Signed)
[employer] 2021-05 - Specimen Collection Study to Evaluate Biomarkers in Subjects with Cancer   ? ?This Nurse has reviewed this patient's inclusion and exclusion criteria as a second review and confirms Nichole [name] is eligible for study participation.  Patient may continue with enrollment.  ? ?Brion Aliment RN, BSN, CCRP ?Clinical Research Nurse Lead ?08/13/2021 2:03 PM   ?

## 2021-08-13 NOTE — Research (Signed)
Trial: [employer] 2021-05 - Specimen Collection Study to Evaluate Biomarkers in Subjects with Cancer  ? ?Patient Nichole [name] was identified by Dr Chryl Heck as a potential candidate for the above listed study.  This Clinical Research Nurse met with Nichole [name], [id number], on 08/13/21 in a manner and location that ensures patient privacy to discuss participation in the above listed research study.  Patient is Unaccompanied.  A copy of the informed consent document with embedded HIPAA language was provided to the patient.  Patient reads, speaks, and understands [place].   ? ?Patient was provided with the business card of this Nurse and encouraged to contact the research team with any questions.  Patient was provided the option of taking informed consent documents home to review and was encouraged to review at their convenience with their support network, including other care providers. Patient is comfortable with making a decision regarding study participation today. ? ?As outlined in the informed consent form, this Nurse and [name] discussed the purpose of the research study, the investigational nature of the study, study procedures and requirements for study participation, potential risks and benefits of study participation, as well as alternatives to participation. This study is not blinded. The patient understands participation is voluntary and they may withdraw from study participation at any time.  This study does not involve randomization.  This study does not involve an investigational drug or device. This study does not involve a placebo. Patient understands enrollment is pending full eligibility review.  ? ?Confidentiality and how the patient's information will be used as part of study participation were discussed.  Patient was informed there is reimbursement provided for their time and effort spent on trial participation.  The patient is encouraged to discuss research study  participation with their insurance provider to determine what costs they may incur as part of study participation, including research related injury.   ? ?All questions were answered to patient's satisfaction.  The informed consent with embedded HIPAA language was reviewed page by page.  The patient's mental and emotional status is appropriate to provide informed consent, and the patient verbalizes an understanding of study participation.  Patient has agreed to participate in the above listed research study and has voluntarily signed the informed consent 23 Jun 2021 with embedded HIPAA language, version 23 Jun 2021  on 08/13/21 at 11:45AM.  The patient was provided with a copy of the signed informed consent form and embedded HIPAA for their reference.  No study specific procedures were obtained prior to the signing of the informed consent document.  Approximately 15 minutes were spent with the patient reviewing the informed consent documents.  Patient was not requested to complete a Release of Information form. ? ?Marjie Skiff. Chou Busler, RN, BSN, CHPN ?She  Her  Hers ?Clinical Research Nurse ?[place] ?Direct Dial [phone]  Pager [phone] ?08/13/2021 1:38 PM ?

## 2021-08-13 NOTE — Assessment & Plan Note (Addendum)
This is a very pleasant 46 year old female patient, premenopausal with family history of breast cancer in mom who noticed breast abnormality in her left breast in December point for further investigation.  She had a mammogram, diagnostic mammogram as well as ultrasound which noticed an abnormality at the 12 o'clock position in the left breast measuring around 20 mm in its largest dimension along with a satellite nodule in the same breast at around 11 o'clock position measuring 4 x 3 x 6 mm without any enlarged left axillary lymphadenopathy.  Biopsy from these masses showed invasive mammary carcinoma, grade 2, prognostics ER 50% moderate staining, PR 95% strong staining, KI less than 5% HER2 negative on the larger mass and similar prognostics on the second mass.   ?Given the intermediate grade, strong ER/PR positivity, low proliferation index and HER2 negative tumor, we discussed about upfront surgery followed by consideration for Oncotype testing.  I believe we should send Oncotype testing on the larger tumor sample at 12 o'clock position.  I have discussed the following details about Oncotype today with the patient. ? ? ? ?We have discussed about Oncotype Dx score which is a well validated prognostic scoring system which can predict outcome with endocrine therapy alone and whether chemotherapy reduces recurrence.  Typically in patients with ER positive cancers that are node negative if the RS score is high typically greater than or equal to 26, chemotherapy is recommended.  ?In women with intermediate recurrence score younger than 29, there can still be some role for chemotherapy in addition to endocrine therapy especially if the recurrence score is between 21-25. ?If chemotherapy is needed, this will precede radiation and then after radiation she will continue on antiestrogen therapy. ?She will qualify for the exact sciences specimen collection study, patient agreeable.  She should also undergo genetic testing  given her age, with genetics team informed ? ?  If she does not require adjuvant chemotherapy, she will proceed with radiation followed by adjuvant antiestrogen therapy.  We have discussed today about mechanism of action of tamoxifen, adverse effects from tamoxifen including but not limited to postmenopausal symptoms, increased risk of DVT/PE, increased risk of endometrial cancer which does not apply to her since she had a hysterectomy, questionably increased risk of cardiovascular events and cataracts.  The benefit from tamoxifen in addition to reduce risk of breast cancer would be improvement in bone density.  She is agreeable to all the recommendations.  She will return to clinic in about 3 weeks after her surgery. ? ?

## 2021-08-14 ENCOUNTER — Encounter: Payer: Self-pay | Admitting: Genetic Counselor

## 2021-08-14 ENCOUNTER — Other Ambulatory Visit: Payer: Self-pay | Admitting: *Deleted

## 2021-08-14 ENCOUNTER — Telehealth: Payer: Self-pay | Admitting: *Deleted

## 2021-08-14 ENCOUNTER — Ambulatory Visit
Admission: RE | Admit: 2021-08-14 | Discharge: 2021-08-14 | Disposition: A | Discharge status: Home / Self Care | Payer: BC Managed Care – PPO | Source: Ambulatory Visit | Attending: Radiation Oncology | Admitting: Radiation Oncology

## 2021-08-14 ENCOUNTER — Encounter: Payer: Self-pay | Admitting: *Deleted

## 2021-08-14 ENCOUNTER — Other Ambulatory Visit: Payer: Self-pay | Admitting: Internal Medicine

## 2021-08-14 ENCOUNTER — Other Ambulatory Visit: Payer: Self-pay | Admitting: Surgery

## 2021-08-14 ENCOUNTER — Encounter: Payer: Self-pay | Admitting: Radiation Oncology

## 2021-08-14 VITALS — BP 132/82 | HR 76 | Temp 96.9°F | Resp 18 | Ht 65.0 in | Wt 219.4 lb

## 2021-08-14 DIAGNOSIS — C50812 Malignant neoplasm of overlapping sites of left female breast: Secondary | ICD-10-CM

## 2021-08-14 DIAGNOSIS — Z87891 Personal history of nicotine dependence: Secondary | ICD-10-CM | POA: Diagnosis not present

## 2021-08-14 DIAGNOSIS — Z803 Family history of malignant neoplasm of breast: Secondary | ICD-10-CM

## 2021-08-14 DIAGNOSIS — Z170 Estrogen receptor positive status [ER+]: Secondary | ICD-10-CM | POA: Diagnosis not present

## 2021-08-14 DIAGNOSIS — C50919 Malignant neoplasm of unspecified site of unspecified female breast: Secondary | ICD-10-CM

## 2021-08-14 DIAGNOSIS — J45990 Exercise induced bronchospasm: Secondary | ICD-10-CM | POA: Insufficient documentation

## 2021-08-14 DIAGNOSIS — C50912 Malignant neoplasm of unspecified site of left female breast: Secondary | ICD-10-CM

## 2021-08-14 DIAGNOSIS — Z8051 Family history of malignant neoplasm of kidney: Secondary | ICD-10-CM

## 2021-08-14 HISTORY — DX: Family history of malignant neoplasm of kidney: Z80.51

## 2021-08-14 HISTORY — DX: Family history of malignant neoplasm of breast: Z80.3

## 2021-08-14 NOTE — Telephone Encounter (Signed)
Spoke to pt regarding navigation resources and contact information. Denies questions or concerns regarding dx or treatment care plan. Encourage pt to call with needs. Received verbal understanding. 

## 2021-08-14 NOTE — Progress Notes (Signed)
REFERRING PROVIDER: ?Benay Pike, MD ?[place] ?[place],  [place] [address] ? ?PRIMARY PROVIDER:  ?Pleas Koch, NP ? ?PRIMARY REASON FOR VISIT:  ?1. Malignant neoplasm of overlapping sites of left breast in female, estrogen receptor positive ([place])   ?2. Family history of breast cancer   ?3. Family history of kidney cancer   ? ? ? ?HISTORY OF PRESENT ILLNESS:   ?Nichole [name], a 46 y.o. female, was seen for a [facility] cancer genetics consultation at the request of Dr. Chryl Heck due to a personal and family history of breast cancer.  Nichole [name] presents to clinic today to discuss the possibility of a hereditary predisposition to cancer, to discuss genetic testing, and to further clarify her future cancer risks, as well as potential cancer risks for family members.  ? ?In March 2023, at the age of 14, Nichole [name] was diagnosed with invasive lobular carcinoma of the left breast. The treatment plan is pending.  Breast conserving surgery is scheduled 09/11/2021.   ? ? ?Past Medical History:  ?Diagnosis Date  ? Asthma   ? exercise induced asthma  ? Family history of breast cancer 08/14/2021  ? Family history of kidney cancer 08/14/2021  ? Pre-diabetes   ? ? ?Past Surgical History:  ?Procedure Laterality Date  ? BIOPSY  03/04/2021  ? Procedure: BIOPSY;  Surgeon: Felicie Morn, MD;  Location: [facility];  Service: General;;  ? BREAST BIOPSY Left 07/30/2021  ? ESOPHAGOGASTRODUODENOSCOPY N/A 03/04/2021  ? Procedure: ESOPHAGOGASTRODUODENOSCOPY (EGD);  Surgeon: Felicie Morn, MD;  Location: [facility];  Service: General;  Laterality: N/A;  ? LAPAROSCOPIC GASTRIC SLEEVE RESECTION N/A 05/02/2021  ? Procedure: LAPAROSCOPIC GASTRIC SLEEVE RESECTION;  Surgeon: Felicie Morn, MD;  Location: [facility];  Service: General;  Laterality: N/A;  ? LAPAROSCOPIC VAGINAL HYSTERECTOMY WITH SALPINGECTOMY N/A 10/08/2016  ? Procedure: LAPAROSCOPIC ASSISTED VAGINAL HYSTERECTOMY WITH RIGHT SALPINGECTOMY LYSIS OF ADHESIONS;   Surgeon: Donnamae Jude, MD;  Location: [facility];  Service: Gynecology;  Laterality: N/A;  ? MYOMECTOMY  2014  ? Right Oophrectomy    ? UPPER GI ENDOSCOPY N/A 05/02/2021  ? Procedure: UPPER GI ENDOSCOPY;  Surgeon: Felicie Morn, MD;  Location: [facility];  Service: General;  Laterality: N/A;  ? WISDOM TOOTH EXTRACTION    ? ? ? ?FAMILY HISTORY:  ?We obtained a detailed, 4-generation family history.  Significant diagnoses are listed below: ?Family History  ?Problem Relation Age of Onset  ? Breast cancer Mother 65  ?     Lumpectomy  ? Kidney cancer Mother 60  ?     Had ablation  ? Graves' disease Mother   ?     Had thyroid removed  ? Graves' disease Brother   ?     Deceased  ? ? ? ?Nichole [name] is unaware of previous family history of genetic testing for hereditary cancer risks. There is no reported Ashkenazi Jewish ancestry. There is no known consanguinity. ? ?GENETIC COUNSELING ASSESSMENT: Nichole [name] is a 46 y.o. female with a personal and family history of cancer which is somewhat suggestive of a hereditary cancer syndrome and predisposition to cancer given her age of diagnosis and the presence of related cancers in the family. We, therefore, discussed and recommended the following at today's visit.  ? ?DISCUSSION: We discussed that 5 - 10% of cancer is hereditary, with most cases of hereditary breast cancer associated with mutations in BRCA1/2.  There are other genes that can be associated with hereditary breast cancer  syndromes.  Type of cancer risk and level of risk are gene-specific. We discussed that testing is beneficial for several reasons including knowing how to follow individuals after completing their treatment, identifying whether potential treatment options would be beneficial, and understanding if other family members could be at risk for cancer and allowing them to undergo genetic testing.  ? ?We reviewed the characteristics, features and inheritance patterns of hereditary cancer syndromes. We also  discussed genetic testing, including the appropriate family members to test, the process of testing, insurance coverage and turn-around-time for results. We discussed the implications of a negative, positive and/or variant of uncertain significant result. In order to get genetic test results in a timely manner so that Nichole [name] can use these genetic test results for surgical decisions, we recommended Nichole [name] pursue genetic testing for the [employer].  The BRCAplus panel offered by [employer] and includes sequencing and deletion/duplication analysis for the following 8 genes: ATM, BRCA1, BRCA2, CDH1, CHEK2, PALB2, PTEN, and TP53. Once complete, we recommend Nichole [name] pursue reflex genetic testing to a more comprehensive gene panel.  ? ?The CancerNext-Expanded gene panel offered by [facility] and includes sequencing, rearrangement, and RNA analysis for the following 77 genes: AIP, ALK, APC, ATM, AXIN2, BAP1, BARD1, BLM, BMPR1A, BRCA1, BRCA2, BRIP1, CDC73, CDH1, CDK4, CDKN1B, CDKN2A, CHEK2, CTNNA1, DICER1, FANCC, FH, FLCN, GALNT12, KIF1B, LZTR1, MAX, MEN1, MET, MLH1, MSH2, MSH3, MSH6, MUTYH, NBN, NF1, NF2, NTHL1, PALB2, PHOX2B, PMS2, POT1, PRKAR1A, PTCH1, PTEN, RAD51C, RAD51D, RB1, RECQL, RET, SDHA, SDHAF2, SDHB, SDHC, SDHD, SMAD4, SMARCA4, SMARCB1, SMARCE1, STK11, SUFU, TMEM127, TP53, TSC1, TSC2, VHL and XRCC2 (sequencing and deletion/duplication); EGFR, EGLN1, HOXB13, KIT, MITF, PDGFRA, POLD1, and POLE (sequencing only); EPCAM and GREM1 (deletion/duplication only).  ? ?Based on Nichole [name]'s personal history of breast cancer at age 70, she meets medical criteria for genetic testing. Despite that she meets criteria, she may still have an out of pocket cost. We discussed that if her out of pocket cost for testing is over $100, the laboratory should contact them to discuss self-pay prices, patient pay assistance programs, if applicable, and other billing options.  ? ?PLAN: After considering the  risks, benefits, and limitations, Nichole [name] provided informed consent to pursue genetic testing and the blood sample was sent to [facility] for analysis of the [place] and CancerNext-Expanded +RNAinsight Panels. Results should be available within approximately 1-2 weeks' time, at which point they will be disclosed by telephone to Nichole [name], as will any additional recommendations warranted by these results. Nichole [name] will receive a summary of her genetic counseling visit and a copy of her results once available. This information will also be available in Epic.  ? ?Lastly, we encouraged Nichole [name] to remain in contact with cancer genetics annually so that we can continuously update the family history and inform her of any changes in cancer genetics and testing that may be of benefit for this family.  ? ?Nichole [name]'s questions were answered to her satisfaction today. Our contact information was provided should additional questions or concerns arise. Thank you for the referral and allowing [place] to share in the care of your patient.  ? ?Nichole [name] M. Joette Catching, [place], [place] ?Genetic Counselor ?[email or web address] ?(P) [phone] ? ?The patient was seen for a total of 20 minutes in face-to-face  genetic counseling.  The patient was seen alone.  Drs. Magrinat, Lindi Adie and/or Burr Medico were available to discuss this case as needed.  ?_______________________________________________________________________ ?For Office Staff:  ?Number of people involved  in session: 1 ?Was an Intern/ student involved with case: no ? ?

## 2021-08-15 ENCOUNTER — Other Ambulatory Visit: Payer: Self-pay | Admitting: Primary Care

## 2021-08-15 DIAGNOSIS — C50919 Malignant neoplasm of unspecified site of unspecified female breast: Secondary | ICD-10-CM

## 2021-08-18 ENCOUNTER — Ambulatory Visit
Admission: RE | Admit: 2021-08-18 | Discharge: 2021-08-18 | Disposition: A | Discharge status: Home / Self Care | Payer: BC Managed Care – PPO | Source: Ambulatory Visit | Attending: Internal Medicine | Admitting: Internal Medicine

## 2021-08-18 DIAGNOSIS — C50919 Malignant neoplasm of unspecified site of unspecified female breast: Secondary | ICD-10-CM

## 2021-08-18 DIAGNOSIS — C50212 Malignant neoplasm of upper-inner quadrant of left female breast: Secondary | ICD-10-CM | POA: Diagnosis not present

## 2021-08-18 MED ORDER — GADOBUTROL 1 MMOL/ML IV SOLN
10.0000 mL | Freq: Once | INTRAVENOUS | Status: AC | PRN
  Administered 2021-08-18: 10 mL via INTRAVENOUS

## 2021-08-19 ENCOUNTER — Other Ambulatory Visit: Payer: Self-pay | Admitting: Genetic Counselor

## 2021-08-19 ENCOUNTER — Other Ambulatory Visit: Payer: Self-pay

## 2021-08-19 ENCOUNTER — Telehealth: Payer: Self-pay | Admitting: Genetic Counselor

## 2021-08-19 ENCOUNTER — Inpatient Hospital Stay: Payer: BC Managed Care – PPO

## 2021-08-19 DIAGNOSIS — C50812 Malignant neoplasm of overlapping sites of left female breast: Secondary | ICD-10-CM

## 2021-08-19 NOTE — Telephone Encounter (Signed)
Genetics sample not received by genetic testing lab.  Called patient to reschedule lab draw at [facility] for today at [place] to be drawn.  ?

## 2021-08-20 ENCOUNTER — Telehealth: Payer: Self-pay | Admitting: *Deleted

## 2021-08-20 ENCOUNTER — Other Ambulatory Visit: Payer: Self-pay | Admitting: Surgery

## 2021-08-20 ENCOUNTER — Encounter: Payer: Self-pay | Admitting: *Deleted

## 2021-08-20 DIAGNOSIS — R928 Other abnormal and inconclusive findings on diagnostic imaging of breast: Secondary | ICD-10-CM

## 2021-08-20 NOTE — Telephone Encounter (Signed)
Called pt with MRI results. Discussed need for R MRI guided bx and msg was sent to Dr. Brantley Stage regarding need for Left MRI bx. Received verbal understanding. No further needs voiced. ?

## 2021-08-27 ENCOUNTER — Ambulatory Visit: Payer: BC Managed Care – PPO | Attending: Hematology and Oncology | Admitting: Rehabilitation

## 2021-08-27 ENCOUNTER — Encounter: Payer: Self-pay | Admitting: Rehabilitation

## 2021-08-27 DIAGNOSIS — Z170 Estrogen receptor positive status [ER+]: Secondary | ICD-10-CM | POA: Insufficient documentation

## 2021-08-27 DIAGNOSIS — C50812 Malignant neoplasm of overlapping sites of left female breast: Secondary | ICD-10-CM | POA: Diagnosis not present

## 2021-08-27 NOTE — Therapy (Signed)
?OUTPATIENT PHYSICAL THERAPY BREAST CANCER BASELINE EVALUATION ? ? ?Patient Name: Nichole [name] ?MRN: [id number] ?DOB:02-Mar-1976, 46 y.o., female ?Today's Date: 08/27/2021 ? ? PT End of Session - 08/27/21 1645   ? ? Visit Number 1   ? Number of Visits 2   ? Date for PT Re-Evaluation 10/22/21   ? PT Start Time 1602   ? PT Stop Time 1640   ? PT Time Calculation (min) 38 min   ? Activity Tolerance Patient tolerated treatment well   ? Behavior During Therapy [facility] for tasks assessed/performed   ? ?  ?  ? ?  ? ? ?Past Medical History:  ?Diagnosis Date  ? Asthma   ? exercise induced asthma  ? Family history of breast cancer 08/14/2021  ? Family history of kidney cancer 08/14/2021  ? Pre-diabetes   ? ?Past Surgical History:  ?Procedure Laterality Date  ? BIOPSY  03/04/2021  ? Procedure: BIOPSY;  Surgeon: Felicie Morn, MD;  [facility]: [facility];  Service: General;;  ? BREAST BIOPSY Left 07/30/2021  ? ESOPHAGOGASTRODUODENOSCOPY N/A 03/04/2021  ? Procedure: ESOPHAGOGASTRODUODENOSCOPY (EGD);  Surgeon: Felicie Morn, MD;  Location: [facility];  Service: General;  Laterality: N/A;  ? LAPAROSCOPIC GASTRIC SLEEVE RESECTION N/A 05/02/2021  ? Procedure: LAPAROSCOPIC GASTRIC SLEEVE RESECTION;  Surgeon: Felicie Morn, MD;  Location: [facility];  Service: General;  Laterality: N/A;  ? LAPAROSCOPIC VAGINAL HYSTERECTOMY WITH SALPINGECTOMY N/A 10/08/2016  ? Procedure: LAPAROSCOPIC ASSISTED VAGINAL HYSTERECTOMY WITH RIGHT SALPINGECTOMY LYSIS OF ADHESIONS;  Surgeon: Donnamae Jude, MD;  Location: [facility];  Service: Gynecology;  Laterality: N/A;  ? MYOMECTOMY  2014  ? Right Oophrectomy    ? UPPER GI ENDOSCOPY N/A 05/02/2021  ? Procedure: UPPER GI ENDOSCOPY;  Surgeon: Felicie Morn, MD;  Location: [facility];  Service: General;  Laterality: N/A;  ? WISDOM TOOTH EXTRACTION    ? ?Patient Active Problem List  ? Diagnosis Date Noted  ? Family history of breast cancer 08/14/2021  ? Family history of kidney cancer 08/14/2021  ?  Malignant neoplasm of overlapping sites of left breast in female, estrogen receptor positive ([place]) 08/11/2021  ? Moderate persistent asthma with exacerbation 02/28/2021  ? Bilateral lower extremity edema 11/21/2020  ? Chronic right-sided low back pain with right-sided sciatica 08/01/2020  ? Preventative health care 04/11/2018  ? Environmental and seasonal allergies 04/11/2018  ? Pain of left thumb 04/11/2018  ? Hyperlipidemia 04/11/2018  ? Lumbar radiculopathy 08/16/2014  ? Prediabetes 08/02/2014  ? Morbid obesity with BMI of 40.0-44.9, adult ([place]) 08/02/2014  ? Tobacco abuse 08/02/2014  ? ? ?PCP: Pleas Koch, NP ? ?REFERRING PROVIDER: Benay Pike, MD ? ?REFERRING DIAG: left breast cancer  ? ?THERAPY DIAG:  ?Malignant neoplasm of overlapping sites of left breast in female, estrogen receptor positive ([place]) - Plan: PT plan of care cert/re-cert ? ?ONSET DATE: 08/23/21 ? ?SUBJECTIVE                                                                                                                                                                                          ? ?  SUBJECTIVE STATEMENT: ?Patient reports she is here today to be seen by her medical team for her newly diagnosed left breast cancer. They will know next week if I also need to do a Rt lumpectomy.   ? ?PERTINENT HISTORY:  ?Pt will be having left lumpectomy and SLNB on 09/11/21 with Dr. Marlou Starks.   ? ?PATIENT GOALS   reduce lymphedema risk and learn post op HEP.  ? ?PAIN:  ?Are you having pain? No ? ? ?PRECAUTIONS: Active CA  ? ?HAND DOMINANCE: right ? ?WEIGHT BEARING RESTRICTIONS No ? ?FALLS:  ?Has patient fallen in last 6 months? No ? ?LIVING ENVIRONMENT: ?Patient lives with: significant other and son ? 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   ? ?LEISURE: 3-4x per week  28-33 minutes depending on ifit program.   ? ?PRIOR LEVEL OF FUNCTION: Independent ? ? ?OBJECTIVE ? ?COGNITION: ? Overall cognitive status: Within functional limits for tasks  assessed   ? ?POSTURE:  ?Forward head and rounded shoulders posture ? ?UPPER EXTREMITY AROM/PROM: ? ?A/PROM RIGHT  08/27/2021 ?  ?Shoulder extension   ?Shoulder flexion 175  ?Shoulder abduction 165  ?Shoulder internal rotation 100  ?Shoulder external rotation   ?  (Blank rows = not tested) ? ?A/PROM LEFT  08/27/2021  ?Shoulder extension   ?Shoulder flexion 175  ?Shoulder abduction 165  ?Shoulder internal rotation 100  ?Shoulder external rotation   ?  (Blank rows = not tested) ? ? ? ?LYMPHEDEMA ASSESSMENTS:  ? ?[place] RIGHT  08/27/2021  ?10 cm proximal to olecranon process 35  ?Olecranon process 28.5  ?10 cm proximal to ulnar styloid process 24.3  ?Just proximal to ulnar styloid process 18  ?Across hand at thumb web space 20.4  ?At base of 2nd digit 7.4  ?(Blank rows = not tested) ? ?[place] LEFT  08/27/2021  ?10 cm proximal to olecranon process 35  ?Olecranon process 28.5  ?10 cm proximal to ulnar styloid process 24.3  ?Just proximal to ulnar styloid process 18  ?Across hand at thumb web space 20.4  ?At base of 2nd digit 7.0  ?(Blank rows = not tested) ? ? ?L-DEX LYMPHEDEMA SCREENING: ? ?The patient was assessed using the L-Dex machine today to produce a lymphedema index baseline score. The patient will be reassessed on a regular basis (typically every 3 months) to obtain new L-Dex scores. If the score is > 6.5 points away from his/her baseline score indicating onset of subclinical lymphedema, it will be recommended to wear a compression garment for 4 weeks, 12 hours per day and then be reassessed. If the score continues to be > 6.5 points from baseline at reassessment, we will initiate lymphedema treatment. Assessing in this manner has a 95% rate of preventing clinically significant lymphedema. ? ? L-DEX FLOWSHEETS - 08/27/21 1600   ? ?  ? L-DEX LYMPHEDEMA SCREENING  ? Measurement Type Unilateral   ? L-DEX MEASUREMENT EXTREMITY Upper Extremity   ? POSITION  Standing   ? DOMINANT SIDE Right   ? At Risk Side Left   ?  BASELINE SCORE (UNILATERAL) -3.8   ? ?  ?  ? ?  ? ? ? ?QUICK DASH SURVEY:4.55% ? ? ?PATIENT EDUCATION:  ?Education details: Lymphedema risk reduction and post op shoulder/posture HEP ?Person educated: Patient ?Education method: Explanation, Demonstration, Handout ?Education comprehension: Patient verbalized understanding and returned demonstration ? ? ?HOME EXERCISE PROGRAM: ?Patient was instructed today in a home exercise program today for post op shoulder range of motion. These included active assist shoulder  flexion in sitting, scapular retraction, wall walking with shoulder abduction, and hands behind head external rotation.  She was encouraged to do these twice a day, holding 3 seconds and repeating 5 times when permitted by her physician. ? ? ?ASSESSMENT: ? ?CLINICAL IMPRESSION: ?Pt will benefit from a post op PT reassessment to determine needs and from L-Dex screens every 3 months for 2 years to detect subclinical lymphedema. ? ?Pt will benefit from skilled therapeutic intervention to improve on the following deficits: Decreased knowledge of precautions, impaired UE functional use, pain, decreased ROM, postural dysfunction.  ? ?PT treatment/interventions: ADL/self-care home management, pt/family education, therapeutic exercise ? ?REHAB POTENTIAL: Excellent ? ?CLINICAL DECISION MAKING: Stable/uncomplicated ? ?EVALUATION COMPLEXITY: Low ? ? ?GOALS: ?Goals reviewed with patient? YES ? ?LONG TERM GOALS: (STG=LTG) ? ? Name Target Date Goal status  ?1 Pt will be able to verbalize understanding of pertinent lymphedema risk reduction practices relevant to her dx specifically related to skin care.  ?Baseline:  No knowledge 08/27/2021 Achieved at eval  ?2 Pt will be able to return demo and/or verbalize understanding of the post op HEP related to regaining shoulder ROM. ?Baseline:  No knowledge 08/27/2021 Achieved at eval  ?3 Pt will be able to verbalize understanding of the importance of attending the post op After Breast  CA Class for further lymphedema risk reduction education and therapeutic exercise.  ?Baseline:  No knowledge 08/27/2021 Achieved at eval  ?4 Pt will demo she has regained full shoulder ROM and function post operativel

## 2021-08-28 ENCOUNTER — Encounter: Payer: Self-pay | Admitting: *Deleted

## 2021-08-29 ENCOUNTER — Telehealth: Payer: Self-pay | Admitting: Genetic Counselor

## 2021-08-29 ENCOUNTER — Encounter: Payer: Self-pay | Admitting: Genetic Counselor

## 2021-08-29 ENCOUNTER — Ambulatory Visit: Payer: Self-pay | Admitting: Genetic Counselor

## 2021-08-29 DIAGNOSIS — Z1379 Encounter for other screening for genetic and chromosomal anomalies: Secondary | ICD-10-CM | POA: Insufficient documentation

## 2021-08-29 DIAGNOSIS — Z8051 Family history of malignant neoplasm of kidney: Secondary | ICD-10-CM

## 2021-08-29 DIAGNOSIS — Z803 Family history of malignant neoplasm of breast: Secondary | ICD-10-CM

## 2021-08-29 DIAGNOSIS — Z170 Estrogen receptor positive status [ER+]: Secondary | ICD-10-CM

## 2021-08-29 NOTE — Progress Notes (Signed)
HPI:   ?Ms. Orsak was previously seen in the [facility] clinic due to a personal and family history of cancer and concerns regarding a hereditary predisposition to cancer. Please refer to our prior cancer genetics clinic note for more information regarding our discussion, assessment and recommendations, at the time. Ms. Malmberg recent genetic test results were disclosed to her, as were recommendations warranted by these results. These results and recommendations are discussed in more detail below. ? ?CANCER HISTORY:  ?Oncology History  ?Malignant neoplasm of overlapping sites of left breast in female, estrogen receptor positive ([place])  ?08/11/2021 Initial Diagnosis  ? Malignant neoplasm of overlapping sites of left breast in female, estrogen receptor positive ([place]) ?  ?08/29/2021 Genetic Testing  ? Negative hereditary cancer genetic testing: no pathogenic variants detected in [facility] CancerNext-Expanded +RNAinsight Panel.  Report date is 08/29/2021.  ? ?The CancerNext-Expanded gene panel offered by [facility] and includes sequencing, rearrangement, and RNA analysis for the following 77 genes: AIP, ALK, APC, ATM, AXIN2, BAP1, BARD1, BLM, BMPR1A, BRCA1, BRCA2, BRIP1, CDC73, CDH1, CDK4, CDKN1B, CDKN2A, CHEK2, CTNNA1, DICER1, FANCC, FH, FLCN, GALNT12, KIF1B, LZTR1, MAX, MEN1, MET, MLH1, MSH2, MSH3, MSH6, MUTYH, NBN, NF1, NF2, NTHL1, PALB2, PHOX2B, PMS2, POT1, PRKAR1A, PTCH1, PTEN, RAD51C, RAD51D, RB1, RECQL, RET, SDHA, SDHAF2, SDHB, SDHC, SDHD, SMAD4, SMARCA4, SMARCB1, SMARCE1, STK11, SUFU, TMEM127, TP53, TSC1, TSC2, VHL and XRCC2 (sequencing and deletion/duplication); EGFR, EGLN1, HOXB13, KIT, MITF, PDGFRA, POLD1, and POLE (sequencing only); EPCAM and GREM1 (deletion/duplication only).  ?  ? ? ?FAMILY HISTORY:  ?We obtained a detailed, 4-generation family history.  Significant diagnoses are listed below: ?Family History  ?Problem Relation Age of Onset  ? Breast cancer Mother 53  ?     Lumpectomy  ?  Kidney cancer Mother 91  ?     Had ablation  ? ?  ?Ms. Raczkowski is unaware of previous family history of genetic testing for hereditary cancer risks. There is no reported Ashkenazi Jewish ancestry. There is no known consanguinity. ?  ? ?GENETIC TEST RESULTS:  ?The [facility] CancerNext-Expanded +RNAinsight Panel found no pathogenic mutations. The CancerNext-Expanded gene panel offered by [facility] and includes sequencing, rearrangement, and RNA analysis for the following 77 genes: AIP, ALK, APC, ATM, AXIN2, BAP1, BARD1, BLM, BMPR1A, BRCA1, BRCA2, BRIP1, CDC73, CDH1, CDK4, CDKN1B, CDKN2A, CHEK2, CTNNA1, DICER1, FANCC, FH, FLCN, GALNT12, KIF1B, LZTR1, MAX, MEN1, MET, MLH1, MSH2, MSH3, MSH6, MUTYH, NBN, NF1, NF2, NTHL1, PALB2, PHOX2B, PMS2, POT1, PRKAR1A, PTCH1, PTEN, RAD51C, RAD51D, RB1, RECQL, RET, SDHA, SDHAF2, SDHB, SDHC, SDHD, SMAD4, SMARCA4, SMARCB1, SMARCE1, STK11, SUFU, TMEM127, TP53, TSC1, TSC2, VHL and XRCC2 (sequencing and deletion/duplication); EGFR, EGLN1, HOXB13, KIT, MITF, PDGFRA, POLD1, and POLE (sequencing only); EPCAM and GREM1 (deletion/duplication only).  ? ? ?The test report has been scanned into EPIC and is located under the Molecular Pathology section of the Results Review tab.  A portion of the result report is included below for reference. Genetic testing reported out on August 29, 2021.  ? ? ? ? ? ?Even though a pathogenic variant was not identified, possible explanations for the cancer in the family may include: ?There may be no hereditary risk for cancer in the family. The cancers in Ms. Vanover and/or her family may be sporadic/familial or due to other genetic and environmental factors. ?There may be a gene mutation in one of these genes that current testing methods cannot detect but that chance is small. ?There could be another gene that has not yet been discovered, or that  we have not yet tested, that is responsible for the cancer diagnoses in the family.  ?It is also possible there is a  hereditary cause for the cancer in the family that Ms. Serpas did not inherit. ? ? ?Therefore, it is important to remain in touch with cancer genetics in the future so that we can continue to offer Ms. Garner the most up to date genetic testing.  ? ?ADDITIONAL GENETIC TESTING:  ?We discussed with Ms. Zechman that her genetic testing was fairly extensive.  If there are genes identified to increase cancer risk that can be analyzed in the future, we would be happy to discuss and coordinate this testing at that time.   ? ?CANCER SCREENING RECOMMENDATIONS:  ?Ms. Novell's test result is considered negative (normal).  This means that we have not identified a hereditary cause for her personal history of breast cancer at this time.  ? ?An individual's cancer risk and medical management are not determined by genetic test results alone. Overall cancer risk assessment incorporates additional factors, including personal medical history, family history, and any available genetic information that may result in a personalized plan for cancer prevention and surveillance. Therefore, it is recommended she continue to follow the cancer management and screening guidelines provided by her oncology and primary healthcare provider. ? ?RECOMMENDATIONS FOR FAMILY MEMBERS:   ?Since she did not inherit a identifiable mutation in a cancer predisposition gene included on this panel, her children could not have inherited a known mutation from her in one of these genes. ?Individuals in this family might be at some increased risk of developing cancer, over the general population risk, due to the family history of cancer.  Individuals in the family should notify their providers of the family history of cancer. We recommend women in this family have a yearly mammogram beginning at age 62, or 59 years younger than the earliest onset of cancer, an annual clinical breast exam, and perform monthly breast self-exams.  F ? ?FOLLOW-UP:  ?Lastly, we discussed  with Ms. Tzeng that cancer genetics is a rapidly advancing field and it is possible that new genetic tests will be appropriate for her and/or her family members in the future. We encouraged her to remain in contact with cancer genetics on an annual basis so we can update her personal and family histories and let her know of advances in cancer genetics that may benefit this family.  ? ?Our contact number was provided. Ms. Shingledecker questions were answered to her satisfaction, and she knows she is welcome to call us at anytime with additional questions or concerns.  ? ?Magda Muise M. Joette Catching, [place], [place] ?Genetic Counselor ?[email or web address] ?(P) [phone] ? ?

## 2021-08-29 NOTE — Telephone Encounter (Signed)
Revealed negative genetic testing.  Discussed that we do not know why she has breast cancer or why there is cancer in the family. It could be sporadic/famillial, due to a different gene that we are not testing, or maybe our current technology may not be able to pick something up.  It will be important for her to keep in contact with genetics to keep up with whether additional testing may be needed.     

## 2021-09-01 ENCOUNTER — Other Ambulatory Visit (HOSPITAL_COMMUNITY): Payer: Self-pay | Admitting: Diagnostic Radiology

## 2021-09-01 ENCOUNTER — Ambulatory Visit
Admission: RE | Admit: 2021-09-01 | Discharge: 2021-09-01 | Disposition: A | Discharge status: Home / Self Care | Payer: BC Managed Care – PPO | Source: Ambulatory Visit | Attending: Surgery | Admitting: Surgery

## 2021-09-01 DIAGNOSIS — N6341 Unspecified lump in right breast, subareolar: Secondary | ICD-10-CM | POA: Diagnosis not present

## 2021-09-01 DIAGNOSIS — R928 Other abnormal and inconclusive findings on diagnostic imaging of breast: Secondary | ICD-10-CM

## 2021-09-01 DIAGNOSIS — N6312 Unspecified lump in the right breast, upper inner quadrant: Secondary | ICD-10-CM | POA: Diagnosis not present

## 2021-09-01 DIAGNOSIS — N6011 Diffuse cystic mastopathy of right breast: Secondary | ICD-10-CM | POA: Diagnosis not present

## 2021-09-01 HISTORY — PX: BREAST BIOPSY: SHX20

## 2021-09-01 MED ORDER — GADOBUTROL 1 MMOL/ML IV SOLN
10.0000 mL | Freq: Once | INTRAVENOUS | Status: AC | PRN
  Administered 2021-09-01: 10 mL via INTRAVENOUS

## 2021-09-03 ENCOUNTER — Encounter: Payer: Self-pay | Admitting: *Deleted

## 2021-09-03 NOTE — Pre-Procedure Instructions (Signed)
Surgical Instructions ? ? ? Your procedure is scheduled on Thursday, April 20th. ? Report to [facility] Main Entrance "A" at 10:30 A.M., then check in with the Admitting office. ? Call this number if you have problems the morning of surgery: ? [phone] ? ? If you have any questions prior to your surgery date call [phone]: Open Monday-Friday 8am-4pm ? ? ? Remember: ? Do not eat after midnight the night before your surgery ? ?You may drink clear liquids until 09:30 the morning of your surgery.   ?Clear liquids allowed are: Water, Non-Citrus Juices (without pulp), Carbonated Beverages, Clear Tea, Black Coffee Only (NO MILK, CREAM OR POWDERED CREAMER of any kind), and Gatorade. ? ? ?Patient Instructions ? ?The night before surgery:  ?No food after midnight. ONLY clear liquids after midnight ? ?The day of surgery (if you do NOT have diabetes):  ?Drink ONE (1) Pre-Surgery Clear Ensure by 09:30 AM the morning of surgery. Drink in one sitting. Do not sip.  ?This drink was given to you during your hospital  ?pre-op appointment visit. ? ?Nothing else to drink after completing the  ?Pre-Surgery Clear Ensure. ? ? ?       If you have questions, please contact your surgeon?s office.  ? ?  ? Take these medicines the morning of surgery with A SIP OF WATER  ? ?If needed: ?albuterol (VENTOLIN HFA) 108 (90 Base)- if needed, bring with you on the day of surgery ?fluticasone (FLONASE)  ? ? ?As of today, STOP taking any Aspirin (unless otherwise instructed by your surgeon) Aleve, Naproxen, Ibuprofen, Motrin, Advil, Goody's, BC's, all herbal medications, fish oil, and all vitamins. ?         ?           ?Do NOT Smoke (Tobacco/Vaping) for 24 hours prior to your procedure. ? ?If you use a CPAP at night, you may bring your mask/headgear for your overnight stay. ?  ?Contacts, glasses, piercing's, hearing aid's, dentures or partials may not be worn into surgery, please bring cases for these belongings.  ?  ?For patients admitted to  the hospital, discharge time will be determined by your treatment team. ?  ?Patients discharged the day of surgery will not be allowed to drive home, and someone needs to stay with them for 24 hours. ? ?SURGICAL WAITING ROOM VISITATION ?Patients having surgery or a procedure may have two support people in the waiting room. These visitors may be switched out with other visitors if needed. ?Children under the age of 74 must have an adult accompany them who is not the patient. ?If the patient needs to stay at the hospital during part of their recovery, the visitor guidelines for inpatient rooms apply. ? ?Please refer to the [facility] website for the visitor guidelines for Inpatients (after your surgery is over and you are in a regular room).  ? ? ?Special instructions:   ?[facility]- Preparing For Surgery ? ?Before surgery, you can play an important role. Because skin is not sterile, your skin needs to be as free of germs as possible. You can reduce the number of germs on your skin by washing with CHG (chlorahexidine gluconate) Soap before surgery.  CHG is an antiseptic cleaner which kills germs and bonds with the skin to continue killing germs even after washing.   ? ?Oral Hygiene is also important to reduce your risk of infection.  Remember - BRUSH YOUR TEETH THE MORNING OF SURGERY WITH YOUR REGULAR TOOTHPASTE ? ?Please do  not use if you have an allergy to CHG or antibacterial soaps. If your skin becomes reddened/irritated stop using the CHG.  ?Do not shave (including legs and underarms) for at least 48 hours prior to first CHG shower. It is OK to shave your face. ? ?Please follow these instructions carefully. ?  ?Shower the NIGHT BEFORE SURGERY and the MORNING OF SURGERY ? ?If you chose to wash your hair, wash your hair first as usual with your normal shampoo. ? ?After you shampoo, rinse your hair and body thoroughly to remove the shampoo. ? ?Use CHG Soap as you would any other liquid soap. You can apply CHG  directly to the skin and wash gently with a scrungie or a clean washcloth.  ? ?Apply the CHG Soap to your body ONLY FROM THE NECK DOWN.  Do not use on open wounds or open sores. Avoid contact with your eyes, ears, mouth and genitals (private parts). Wash Face and genitals (private parts)  with your normal soap.  ? ?Wash thoroughly, paying special attention to the area where your surgery will be performed. ? ?Thoroughly rinse your body with warm water from the neck down. ? ?DO NOT shower/wash with your normal soap after using and rinsing off the CHG Soap. ? ?Pat yourself dry with a CLEAN TOWEL. ? ?Wear CLEAN PAJAMAS to bed the night before surgery ? ?Place CLEAN SHEETS on your bed the night before your surgery ? ?DO NOT SLEEP WITH PETS. ? ? ?Day of Surgery: ?Take a shower with CHG soap. ?Do not wear jewelry or makeup ?Do not wear lotions, powders, perfumes, or deodorant. ?Do not shave 48 hours prior to surgery.   ?Do not bring valuables to the hospital.  ?[facility] is not responsible for any belongings or valuables. ?Do not wear nail polish, gel polish, artificial nails, or any other type of covering on natural nails (fingers and toes) ?If you have artificial nails or gel coating that need to be removed by a nail salon, please have this removed prior to surgery. Artificial nails or gel coating may interfere with anesthesia's ability to adequately monitor your vital signs. ?Wear Clean/Comfortable clothing the morning of surgery ?Remember to brush your teeth WITH YOUR REGULAR TOOTHPASTE. ?  ?Please read over the following fact sheets that you were given. ? ? ? ?If you received a COVID test during your pre-op visit  it is requested that you wear a mask when out in public, stay away from anyone that may not be feeling well and notify your surgeon if you develop symptoms. If you have been in contact with anyone that has tested positive in the last 10 days please notify you surgeon.  ?

## 2021-09-04 ENCOUNTER — Other Ambulatory Visit: Payer: Self-pay

## 2021-09-04 ENCOUNTER — Encounter (HOSPITAL_COMMUNITY): Payer: Self-pay

## 2021-09-04 ENCOUNTER — Encounter (HOSPITAL_COMMUNITY)
Admission: RE | Admit: 2021-09-04 | Discharge: 2021-09-04 | Disposition: A | Discharge status: Home / Self Care | Payer: BC Managed Care – PPO | Source: Ambulatory Visit | Attending: Surgery | Admitting: Surgery

## 2021-09-04 VITALS — BP 136/77 | HR 80 | Temp 98.2°F | Resp 17 | Ht 65.0 in | Wt 217.0 lb

## 2021-09-04 DIAGNOSIS — Z01818 Encounter for other preprocedural examination: Secondary | ICD-10-CM

## 2021-09-04 DIAGNOSIS — Z01812 Encounter for preprocedural laboratory examination: Secondary | ICD-10-CM | POA: Insufficient documentation

## 2021-09-04 DIAGNOSIS — C50912 Malignant neoplasm of unspecified site of left female breast: Secondary | ICD-10-CM | POA: Diagnosis not present

## 2021-09-04 LAB — CBC
HCT: 34.7 % — ABNORMAL LOW (ref 36.0–46.0)
Hemoglobin: 11.4 g/dL — ABNORMAL LOW (ref 12.0–15.0)
MCH: 29.8 pg (ref 26.0–34.0)
MCHC: 32.9 g/dL (ref 30.0–36.0)
MCV: 90.8 fL (ref 80.0–100.0)
Platelets: 231 10*3/uL (ref 150–400)
RBC: 3.82 MIL/uL — ABNORMAL LOW (ref 3.87–5.11)
RDW: 12.9 % (ref 11.5–15.5)
WBC: 5 10*3/uL (ref 4.0–10.5)
nRBC: 0 % (ref 0.0–0.2)

## 2021-09-04 NOTE — Progress Notes (Signed)
PCP - Alma Friendly, NP ?Cardiologist - denies ? ?PPM/ICD - denies ? ? ?Chest x-ray - 02/06/21 ?EKG - 02/06/21 ?Stress Test - denies ?ECHO - denies ?Cardiac Cath - denies ? ?Sleep Study - denies ? ?DM- denies ? ?ASA/Blood Thinner Instructions: n/a ? ? ?ERAS Protcol - yes ?PRE-SURGERY Ensure given at PAT ? ?COVID TEST- n/a ? ? ?Anesthesia review: no ? ?Patient denies shortness of breath, fever, cough and chest pain at PAT appointment ? ? ?All instructions explained to the patient, with a verbal understanding of the material. Patient agrees to go over the instructions while at home for a better understanding. Patient also instructed to notify surgeon of any contact with COVID + person or if she develops any symptoms. The opportunity to ask questions was provided. ?  ?

## 2021-09-05 ENCOUNTER — Ambulatory Visit
Admission: RE | Admit: 2021-09-05 | Discharge: 2021-09-05 | Disposition: A | Discharge status: Home / Self Care | Payer: BC Managed Care – PPO | Source: Ambulatory Visit | Attending: Surgery | Admitting: Surgery

## 2021-09-05 DIAGNOSIS — R928 Other abnormal and inconclusive findings on diagnostic imaging of breast: Secondary | ICD-10-CM

## 2021-09-05 DIAGNOSIS — C50212 Malignant neoplasm of upper-inner quadrant of left female breast: Secondary | ICD-10-CM | POA: Diagnosis not present

## 2021-09-05 DIAGNOSIS — C50912 Malignant neoplasm of unspecified site of left female breast: Secondary | ICD-10-CM | POA: Diagnosis not present

## 2021-09-05 HISTORY — PX: BREAST BIOPSY: SHX20

## 2021-09-05 MED ORDER — GADOBUTROL 1 MMOL/ML IV SOLN
10.0000 mL | Freq: Once | INTRAVENOUS | Status: AC | PRN
  Administered 2021-09-05: 10 mL via INTRAVENOUS

## 2021-09-08 ENCOUNTER — Telehealth: Payer: Self-pay | Admitting: Hematology and Oncology

## 2021-09-08 ENCOUNTER — Ambulatory Visit: Payer: Self-pay | Admitting: Surgery

## 2021-09-08 ENCOUNTER — Encounter: Payer: Self-pay | Admitting: *Deleted

## 2021-09-08 NOTE — Telephone Encounter (Signed)
Per 4/17 in basket called and spoke to pt to reschedule appointment.  Pt confirmed appointment  ?

## 2021-09-10 ENCOUNTER — Ambulatory Visit
Admission: RE | Admit: 2021-09-10 | Discharge: 2021-09-10 | Disposition: A | Discharge status: Home / Self Care | Payer: BC Managed Care – PPO | Source: Ambulatory Visit | Attending: Surgery | Admitting: Surgery

## 2021-09-10 ENCOUNTER — Other Ambulatory Visit: Payer: Self-pay | Admitting: Surgery

## 2021-09-10 DIAGNOSIS — C50912 Malignant neoplasm of unspecified site of left female breast: Secondary | ICD-10-CM | POA: Diagnosis not present

## 2021-09-11 ENCOUNTER — Other Ambulatory Visit: Payer: Self-pay

## 2021-09-11 ENCOUNTER — Encounter (HOSPITAL_COMMUNITY)
Admission: RE | Disposition: A | Discharge status: Home / Self Care | Payer: Self-pay | Source: Ambulatory Visit | Attending: Surgery

## 2021-09-11 ENCOUNTER — Ambulatory Visit
Admission: RE | Admit: 2021-09-11 | Discharge: 2021-09-11 | Disposition: A | Discharge status: Home / Self Care | Payer: BC Managed Care – PPO | Source: Ambulatory Visit | Attending: Surgery | Admitting: Surgery

## 2021-09-11 ENCOUNTER — Encounter (HOSPITAL_COMMUNITY): Payer: Self-pay | Admitting: Surgery

## 2021-09-11 ENCOUNTER — Ambulatory Visit (HOSPITAL_COMMUNITY)
Admission: RE | Admit: 2021-09-11 | Discharge: 2021-09-11 | Disposition: A | Discharge status: Home / Self Care | Payer: BC Managed Care – PPO | Source: Ambulatory Visit | Attending: Surgery | Admitting: Surgery

## 2021-09-11 ENCOUNTER — Ambulatory Visit (HOSPITAL_COMMUNITY): Payer: BC Managed Care – PPO | Admitting: Anesthesiology

## 2021-09-11 DIAGNOSIS — N6012 Diffuse cystic mastopathy of left breast: Secondary | ICD-10-CM | POA: Diagnosis not present

## 2021-09-11 DIAGNOSIS — M5416 Radiculopathy, lumbar region: Secondary | ICD-10-CM | POA: Diagnosis not present

## 2021-09-11 DIAGNOSIS — G8918 Other acute postprocedural pain: Secondary | ICD-10-CM | POA: Diagnosis not present

## 2021-09-11 DIAGNOSIS — J45909 Unspecified asthma, uncomplicated: Secondary | ICD-10-CM | POA: Diagnosis not present

## 2021-09-11 DIAGNOSIS — Z803 Family history of malignant neoplasm of breast: Secondary | ICD-10-CM | POA: Insufficient documentation

## 2021-09-11 DIAGNOSIS — C50912 Malignant neoplasm of unspecified site of left female breast: Secondary | ICD-10-CM

## 2021-09-11 DIAGNOSIS — Z87891 Personal history of nicotine dependence: Secondary | ICD-10-CM | POA: Diagnosis not present

## 2021-09-11 DIAGNOSIS — C50412 Malignant neoplasm of upper-outer quadrant of left female breast: Secondary | ICD-10-CM | POA: Diagnosis not present

## 2021-09-11 DIAGNOSIS — M5430 Sciatica, unspecified side: Secondary | ICD-10-CM | POA: Insufficient documentation

## 2021-09-11 DIAGNOSIS — R928 Other abnormal and inconclusive findings on diagnostic imaging of breast: Secondary | ICD-10-CM | POA: Diagnosis not present

## 2021-09-11 DIAGNOSIS — C773 Secondary and unspecified malignant neoplasm of axilla and upper limb lymph nodes: Secondary | ICD-10-CM | POA: Diagnosis not present

## 2021-09-11 DIAGNOSIS — Z9884 Bariatric surgery status: Secondary | ICD-10-CM | POA: Diagnosis not present

## 2021-09-11 DIAGNOSIS — Z170 Estrogen receptor positive status [ER+]: Secondary | ICD-10-CM | POA: Insufficient documentation

## 2021-09-11 HISTORY — PX: BREAST LUMPECTOMY WITH RADIOACTIVE SEED AND SENTINEL LYMPH NODE BIOPSY: SHX6550

## 2021-09-11 SURGERY — BREAST LUMPECTOMY WITH RADIOACTIVE SEED AND SENTINEL LYMPH NODE BIOPSY
Anesthesia: General | Site: Breast | Laterality: Left

## 2021-09-11 MED ORDER — FENTANYL CITRATE (PF) 100 MCG/2ML IJ SOLN: 50.0000 ug | Freq: Once | INTRAMUSCULAR | Status: AC

## 2021-09-11 MED ORDER — LACTATED RINGERS IV SOLN: INTRAVENOUS | Status: DC

## 2021-09-11 MED ORDER — PHENYLEPHRINE 80 MCG/ML (10ML) SYRINGE FOR IV PUSH (FOR BLOOD PRESSURE SUPPORT)
PREFILLED_SYRINGE | INTRAVENOUS | Status: DC | PRN
  Administered 2021-09-11: 40 ug via INTRAVENOUS
  Administered 2021-09-11 (×6): 80 ug via INTRAVENOUS

## 2021-09-11 MED ORDER — FENTANYL CITRATE (PF) 250 MCG/5ML IJ SOLN
INTRAMUSCULAR | Status: DC | PRN
  Administered 2021-09-11 (×2): 25 ug via INTRAVENOUS
  Administered 2021-09-11: 50 ug via INTRAVENOUS

## 2021-09-11 MED ORDER — PROPOFOL 10 MG/ML IV BOLUS
INTRAVENOUS | Status: AC
  Filled 2021-09-11: qty 20

## 2021-09-11 MED ORDER — ONDANSETRON HCL 4 MG/2ML IJ SOLN: 4.0000 mg | Freq: Once | INTRAMUSCULAR | Status: DC | PRN

## 2021-09-11 MED ORDER — PROPOFOL 10 MG/ML IV BOLUS
INTRAVENOUS | Status: DC | PRN
Start: 2021-09-11 — End: 2021-09-11
  Administered 2021-09-11: 200 mg via INTRAVENOUS

## 2021-09-11 MED ORDER — CHLORHEXIDINE GLUCONATE CLOTH 2 % EX PADS: 6.0000 | MEDICATED_PAD | Freq: Once | CUTANEOUS | Status: DC

## 2021-09-11 MED ORDER — CHLORHEXIDINE GLUCONATE 0.12 % MT SOLN: 15.0000 mL | Freq: Once | OROMUCOSAL | Status: AC

## 2021-09-11 MED ORDER — OXYCODONE HCL 5 MG PO TABS: 5.0000 mg | ORAL_TABLET | Freq: Four times a day (QID) | ORAL | 0 refills | Status: DC | PRN

## 2021-09-11 MED ORDER — MAGTRACE LYMPHATIC TRACER
INTRAMUSCULAR | Status: DC | PRN
  Administered 2021-09-11: 2 mL via INTRAMUSCULAR

## 2021-09-11 MED ORDER — MIDAZOLAM HCL 2 MG/2ML IJ SOLN
INTRAMUSCULAR | Status: AC
  Administered 2021-09-11: 2 mg
  Filled 2021-09-11: qty 2

## 2021-09-11 MED ORDER — OXYCODONE HCL 5 MG PO TABS: 5.0000 mg | ORAL_TABLET | Freq: Once | ORAL | Status: DC | PRN

## 2021-09-11 MED ORDER — ONDANSETRON HCL 4 MG/2ML IJ SOLN
INTRAMUSCULAR | Status: DC | PRN
  Administered 2021-09-11: 4 mg via INTRAVENOUS

## 2021-09-11 MED ORDER — FENTANYL CITRATE (PF) 100 MCG/2ML IJ SOLN: 25.0000 ug | INTRAMUSCULAR | Status: DC | PRN

## 2021-09-11 MED ORDER — OXYCODONE HCL 5 MG/5ML PO SOLN: 5.0000 mg | Freq: Once | ORAL | Status: DC | PRN

## 2021-09-11 MED ORDER — CEFAZOLIN SODIUM-DEXTROSE 2-4 GM/100ML-% IV SOLN
2.0000 g | INTRAVENOUS | Status: AC
  Administered 2021-09-11: 3 g via INTRAVENOUS

## 2021-09-11 MED ORDER — FENTANYL CITRATE (PF) 250 MCG/5ML IJ SOLN
INTRAMUSCULAR | Status: AC
  Filled 2021-09-11: qty 5

## 2021-09-11 MED ORDER — MIDAZOLAM HCL 2 MG/2ML IJ SOLN: 2.0000 mg | Freq: Once | INTRAMUSCULAR | Status: AC

## 2021-09-11 MED ORDER — BUPIVACAINE HCL (PF) 0.5 % IJ SOLN
INTRAMUSCULAR | Status: DC | PRN
  Administered 2021-09-11: 20 mL

## 2021-09-11 MED ORDER — LIDOCAINE 2% (20 MG/ML) 5 ML SYRINGE
INTRAMUSCULAR | Status: DC | PRN
  Administered 2021-09-11: 100 mg via INTRAVENOUS

## 2021-09-11 MED ORDER — FENTANYL CITRATE (PF) 100 MCG/2ML IJ SOLN
INTRAMUSCULAR | Status: AC
  Administered 2021-09-11: 50 ug
  Filled 2021-09-11: qty 2

## 2021-09-11 MED ORDER — DEXAMETHASONE SODIUM PHOSPHATE 10 MG/ML IJ SOLN
INTRAMUSCULAR | Status: DC | PRN
  Administered 2021-09-11: 10 mg via INTRAVENOUS

## 2021-09-11 MED ORDER — BUPIVACAINE-EPINEPHRINE 0.25% -1:200000 IJ SOLN
INTRAMUSCULAR | Status: DC | PRN
  Administered 2021-09-11: 21 mL

## 2021-09-11 MED ORDER — PROPOFOL 500 MG/50ML IV EMUL
INTRAVENOUS | Status: DC | PRN
  Administered 2021-09-11: 150 ug/kg/min via INTRAVENOUS

## 2021-09-11 MED ORDER — 0.9 % SODIUM CHLORIDE (POUR BTL) OPTIME
TOPICAL | Status: DC | PRN
  Administered 2021-09-11: 1000 mL

## 2021-09-11 MED ORDER — MIDAZOLAM HCL 2 MG/2ML IJ SOLN
INTRAMUSCULAR | Status: AC
  Filled 2021-09-11: qty 2

## 2021-09-11 MED ORDER — IBUPROFEN 800 MG PO TABS: 800.0000 mg | ORAL_TABLET | Freq: Three times a day (TID) | ORAL | 0 refills | Status: DC | PRN

## 2021-09-11 MED ORDER — ACETAMINOPHEN 500 MG PO TABS
ORAL_TABLET | ORAL | Status: AC
  Administered 2021-09-11: 1000 mg via ORAL
  Filled 2021-09-11: qty 2

## 2021-09-11 MED ORDER — CEFAZOLIN IN SODIUM CHLORIDE 3-0.9 GM/100ML-% IV SOLN
INTRAVENOUS | Status: AC
  Filled 2021-09-11: qty 100

## 2021-09-11 MED ORDER — PHENYLEPHRINE 80 MCG/ML (10ML) SYRINGE FOR IV PUSH (FOR BLOOD PRESSURE SUPPORT)
PREFILLED_SYRINGE | INTRAVENOUS | Status: AC
  Filled 2021-09-11: qty 10

## 2021-09-11 MED ORDER — ORAL CARE MOUTH RINSE: 15.0000 mL | Freq: Once | OROMUCOSAL | Status: AC

## 2021-09-11 MED ORDER — BUPIVACAINE LIPOSOME 1.3 % IJ SUSP
INTRAMUSCULAR | Status: DC | PRN
  Administered 2021-09-11: 10 mL

## 2021-09-11 MED ORDER — ACETAMINOPHEN 500 MG PO TABS: 1000.0000 mg | ORAL_TABLET | ORAL | Status: AC

## 2021-09-11 MED ORDER — CHLORHEXIDINE GLUCONATE 0.12 % MT SOLN
OROMUCOSAL | Status: AC
Start: 2021-09-11 — End: 2021-09-11
  Administered 2021-09-11: 15 mL via OROMUCOSAL
  Filled 2021-09-11: qty 15

## 2021-09-11 SURGICAL SUPPLY — 44 items
APPLIER CLIP 9.375 MED OPEN (MISCELLANEOUS) ×2
BAG COUNTER SPONGE SURGICOUNT (BAG) IMPLANT
BINDER BREAST LRG (GAUZE/BANDAGES/DRESSINGS) IMPLANT
BINDER BREAST XLRG (GAUZE/BANDAGES/DRESSINGS) IMPLANT
BINDER BREAST XXLRG (GAUZE/BANDAGES/DRESSINGS) ×1 IMPLANT
CANISTER SUCT 3000ML PPV (MISCELLANEOUS) ×2 IMPLANT
CHLORAPREP W/TINT 26 (MISCELLANEOUS) ×2 IMPLANT
CLIP APPLIE 9.375 MED OPEN (MISCELLANEOUS) ×1 IMPLANT
CNTNR URN SCR LID CUP LEK RST (MISCELLANEOUS) ×1 IMPLANT
CONT SPEC 4OZ STRL OR WHT (MISCELLANEOUS) ×1
COVER PROBE W GEL 5X96 (DRAPES) ×3 IMPLANT
COVER SURGICAL LIGHT HANDLE (MISCELLANEOUS) ×2 IMPLANT
DERMABOND ADVANCED (GAUZE/BANDAGES/DRESSINGS) ×1
DERMABOND ADVANCED .7 DNX12 (GAUZE/BANDAGES/DRESSINGS) ×1 IMPLANT
DEVICE DUBIN SPECIMEN MAMMOGRA (MISCELLANEOUS) ×2 IMPLANT
DRAPE CHEST BREAST 15X10 FENES (DRAPES) ×2 IMPLANT
ELECT CAUTERY BLADE 6.4 (BLADE) ×2 IMPLANT
ELECT REM PT RETURN 9FT ADLT (ELECTROSURGICAL) ×2
ELECTRODE REM PT RTRN 9FT ADLT (ELECTROSURGICAL) ×1 IMPLANT
GLOVE BIO SURGEON STRL SZ8 (GLOVE) ×2 IMPLANT
GLOVE BIOGEL PI IND STRL 8 (GLOVE) ×1 IMPLANT
GLOVE BIOGEL PI INDICATOR 8 (GLOVE) ×1
GOWN STRL REUS W/ TWL LRG LVL3 (GOWN DISPOSABLE) ×1 IMPLANT
GOWN STRL REUS W/ TWL XL LVL3 (GOWN DISPOSABLE) ×1 IMPLANT
GOWN STRL REUS W/TWL LRG LVL3 (GOWN DISPOSABLE) ×1
GOWN STRL REUS W/TWL XL LVL3 (GOWN DISPOSABLE) ×1
KIT BASIN OR (CUSTOM PROCEDURE TRAY) ×2 IMPLANT
KIT MARKER MARGIN INK (KITS) ×2 IMPLANT
LIGHT WAVEGUIDE WIDE FLAT (MISCELLANEOUS) IMPLANT
NDL 18GX1X1/2 (RX/OR ONLY) (NEEDLE) IMPLANT
NDL FILTER BLUNT 18X1 1/2 (NEEDLE) IMPLANT
NDL HYPO 25GX1X1/2 BEV (NEEDLE) ×1 IMPLANT
NEEDLE 18GX1X1/2 (RX/OR ONLY) (NEEDLE) IMPLANT
NEEDLE FILTER BLUNT 18X 1/2SAF (NEEDLE)
NEEDLE FILTER BLUNT 18X1 1/2 (NEEDLE) IMPLANT
NEEDLE HYPO 25GX1X1/2 BEV (NEEDLE) ×2 IMPLANT
NS IRRIG 1000ML POUR BTL (IV SOLUTION) ×2 IMPLANT
PACK GENERAL/GYN (CUSTOM PROCEDURE TRAY) ×2 IMPLANT
SUT MNCRL AB 4-0 PS2 18 (SUTURE) ×3 IMPLANT
SUT VIC AB 3-0 SH 18 (SUTURE) ×3 IMPLANT
SYR CONTROL 10ML LL (SYRINGE) ×2 IMPLANT
TOWEL GREEN STERILE (TOWEL DISPOSABLE) ×2 IMPLANT
TOWEL GREEN STERILE FF (TOWEL DISPOSABLE) ×2 IMPLANT
TRACER MAGTRACE VIAL (MISCELLANEOUS) ×1 IMPLANT

## 2021-09-11 NOTE — Transfer of Care (Signed)
Immediate Anesthesia Transfer of Care Note ? ?Patient: Nichole [name] ? ?Procedure(s) Performed: LEFT BREAST LUMPECTOMY WITH RADIOACTIVE SEED X3 AND LEFT SENTINEL LYMPH NODE BIOPSY (Left: Breast) ? ?Patient Location: PACU ? ?Anesthesia Type:General ? ?Level of Consciousness: patient cooperative and responds to stimulation ? ?Airway & Oxygen Therapy: Patient Spontanous Breathing ? ?Post-op Assessment: Report given to RN and Post -op Vital signs reviewed and stable ? ?Post vital signs: Reviewed and stable ? ?Last Vitals:  ?Vitals Value Taken Time  ?BP 118/63 09/11/21 1209  ?Temp 36.4 ?C 09/11/21 1210  ?Pulse 83 09/11/21 1216  ?Resp 16 09/11/21 1216  ?SpO2 100 % 09/11/21 1216  ?Vitals shown include unvalidated device data. ? ?Last Pain:  ?Vitals:  ? 09/11/21 0946  ?TempSrc:   ?PainSc: 0-No pain  ?   ? ?  ? ?Complications: No notable events documented. ?

## 2021-09-11 NOTE — H&P (Signed)
Chief Complaint: Breast Cancer ? ? ?History of Present Illness: ?Nichole [name] is a 46 y.o. female who is seen today as an office consultation for evaluation of Breast Cancer ?.  ? ?Patient presents for evaluation of left breast fullness. She stated she felt some fullness in her left breast and felt more firm. She was sent for work-up which included a mammogram which showed 2 discrete areas in the left central breast to left upper outer quadrant. Core biopsy of both revealed invasive mammary carcinoma consistent with a lobular phenotype. Both were ER positive, PR positive and HER2 negative. She presents for evaluation treatment of her left breast cancer. She had her mother had breast cancer in her 3s. She is still alive. No other cancers noted. Denies any history of discharge. Right breast is felt normal she states. The right mammogram appears normal. History of gastric sleeve December 2022 ? ?Review of Systems: ?A complete review of systems was obtained from the patient. I have reviewed this information and discussed as appropriate with the patient. See HPI as well for other ROS. ? ? ? ?Medical History: ?Past Medical History:  ?Diagnosis Date  ? Anemia  ? Asthma, unspecified asthma severity, unspecified whether complicated, unspecified whether persistent  ? Dyspnea on exertion  ? Hyperlipidemia  ? Pre-diabetes  ? ?Patient Active Problem List  ?Diagnosis  ? Bilateral lower extremity edema  ? Chronic right-sided low back pain with right-sided sciatica  ? Environmental and seasonal allergies  ? Hyperlipidemia  ? Lumbar radiculopathy  ? Morbid obesity with BMI of 40.0-44.9, adult (CMS-HCC)  ? Pain of left thumb  ? Prediabetes  ? Preventative health care  ? Tobacco abuse  ? Moderate persistent asthma with exacerbation  ? ?Past Surgical History:  ?Procedure Laterality Date  ? [place] ABDOMINAL 2014  ? laparoscopic vaginal hysterectomy with salpingectomy 10/08/2016  ?Dr. Darron Doom  ? EGD 03/04/2021  ? Gastric  Sleeve 05/02/2021  ? right oophrectomy  ? wisdom tooth extraction  ? ? ?No Known Allergies ? ?Current Outpatient Medications on File Prior to Visit  ?Medication Sig Dispense Refill  ? CALCIUM ORAL Take by mouth  ? cetirizine (ZYRTEC) 10 MG tablet TAKE 1 TABLET (10 MG TOTAL) BY MOUTH EVERY EVENING. FOR ALLERGIES. NOT COVERED  ? multivitamin tablet Take 1 tablet by mouth once daily  ? pantoprazole (PROTONIX) 40 MG DR tablet TAKE 1 TABLET BY MOUTH EVERY DAY 90 tablet 3  ? albuterol 90 mcg/actuation inhaler Inhale 1-2 puffs into the lungs every 6 (six) hours as needed for wheezing or shortness of breath.  ? fluticasone propionate (FLONASE) 50 mcg/actuation nasal spray Place 1 spray into both nostrils 2 (two) times daily  ? gabapentin (NEURONTIN) 300 MG capsule 1 po qHS 30 capsule 1  ? linaCLOtide (LINZESS) 72 mcg capsule Take 1 capsule (72 mcg total) by mouth once daily 30 capsule 0  ? methocarbamoL (ROBAXIN) 500 MG tablet 1/2-1 po qHS prn 30 tablet 0  ? ?No current facility-administered medications on file prior to visit.  ? ?Family History  ?Problem Relation Age of Onset  ? Arthritis Mother  ? Breast cancer Mother  ? Hyperlipidemia (Elevated cholesterol) Mother  ? High blood pressure (Hypertension) Mother  ? Kidney cancer Mother  ? Graves' disease Mother  ? Graves' disease Brother  ? ? ?Social History  ? ?Tobacco Use  ?Smoking Status Former  ? Types: Cigarettes  ? Quit date: 01/23/2017  ? Years since quitting: 4.5  ?Smokeless Tobacco Never  ? ? ?Social  History  ? ?Socioeconomic History  ? Marital status: Single  ?Tobacco Use  ? Smoking status: Former  ?Types: Cigarettes  ?Quit date: 01/23/2017  ?Years since quitting: 4.5  ? Smokeless tobacco: Never  ?Vaping Use  ? Vaping Use: Never used  ?Substance and Sexual Activity  ? Alcohol use: Yes  ? Drug use: Never  ? Sexual activity: Not Currently  ? ?Objective:  ? ?Vitals:  ?08/08/21 1014  ?Pulse: 74  ?Weight: 100.7 kg (222 lb)  ?Height: 166.4 cm (5' 5.5")  ? ?Body mass index is  36.38 kg/m?. ? ?Physical Exam ?HENT:  ?Head: Normocephalic.  ?Eyes:  ?General: No scleral icterus. ?Pupils: Pupils are equal, round, and reactive to light.  ?Cardiovascular:  ?Rate and Rhythm: Normal rate.  ?Pulmonary:  ?Effort: Pulmonary effort is normal.  ?Breath sounds: No stridor.  ?Chest:  ?Breasts: ?Right: Normal.  ?Left: Mass and tenderness present. No nipple discharge.  ? ?Musculoskeletal:  ?Cervical back: Normal range of motion.  ?Lymphadenopathy:  ?Upper Body:  ?Right upper body: No supraclavicular or axillary adenopathy.  ?Left upper body: No supraclavicular or axillary adenopathy.  ?Neurological:  ?General: No focal deficit present.  ?Mental Status: She is alert and oriented to person, place, and time.  ? ? ? ?Labs, Imaging and Diagnostic Testing: ?ADDITIONAL INFORMATION: ?1. FLUORESCENCE IN-SITU HYBRIDIZATION ?Results: ?GROUP 5: HER2 **NEGATIVE** ?Equivocal form of amplification of the HER2 gene was detected in the IHC 2+ tissue sample received from this ?individual. HER2 FISH was performed by a technologist and cell imaging and analysis on the [employer]. ?RATIO OF HER2/CEN17 SIGNALS 1.50 ?AVERAGE HER2 COPY NUMBER PER CELL 3.30 ?The ratio of HER2/CEN 17 is within the range < 2.0 of HER2/CEN 17 and a copy number of HER2 signals per cell is <4.0. ?[facility] 1:1,2018 ?Thressa Sheller MD ?Pathologist, Electronic Signature ?( Signed 08/07/2021) ?1. PROGNOSTIC INDICATORS ?Results: ?IMMUNOHISTOCHEMICAL AND MORPHOMETRIC ANALYSIS PERFORMED MANUALLY ?The tumor cells are EQUIVOCAL for Her2 (2+). Her2 by FISH will be performed and the results reported separately. ?Estrogen Receptor: 50%, POSITIVE, MODERATE STAINING INTENSITY ?Progesterone Receptor: 95%, POSITIVE, STRONG STAINING INTENSITY ?Proliferation Marker Ki67: <5% ?REFERENCE RANGE ESTROGEN RECEPTOR ?NEGATIVE 0% ?POSITIVE =>1% ?REFERENCE RANGE PROGESTERONE RECEPTOR ?1 of 4 ?FINAL for [name], Nichole L ([id number]) ?ADDITIONAL  INFORMATION:(continued) ?NEGATIVE 0% ?POSITIVE =>1% ?All controls stained appropriately ?Thressa Sheller MD ?Pathologist, Electronic Signature ?( Signed 08/01/2021) ?1. and 2. Immunostains for E-cadherin are negative in the tumor cells, consistent with a lobular phenotype. ?Jaquita Folds MD ?Pathologist, Electronic Signature ?( Signed 08/01/2021) ?2. FLUORESCENCE IN-SITU HYBRIDIZATION ?Results: ?GROUP 5: HER2 **NEGATIVE** ?Equivocal form of amplification of the HER2 gene was detected in the IHC 2+ tissue sample received from this ?individual. HER2 FISH was performed by a technologist and cell imaging and analysis on the [employer]. ?RATIO OF HER2/CEN17 SIGNALS 1.29 ?AVERAGE HER2 COPY NUMBER PER CELL 3.30 ?The ratio of HER2/CEN 17 is within the range < 2.0 of HER2/CEN 17 and a copy number of HER2 signals per cell is <4.0. ?[facility] 1:1,2018 ?Thressa Sheller MD ?Pathologist, Electronic Signature ?( Signed 08/07/2021) ?2. PROGNOSTIC INDICATORS ?Results: ?IMMUNOHISTOCHEMICAL AND MORPHOMETRIC ANALYSIS PERFORMED MANUALLY ?The tumor cells are EQUIVOCAL for Her2 (2+). Her2 by FISH will be performed and the results reported separately. ?Estrogen Receptor: 40%, POSITIVE, STRONG STAINING INTENSITY ?Progesterone Receptor: 90%, POSITIVE, STRONG STAINING INTENSITY ?Proliferation Marker Ki67: <5% ?REFERENCE RANGE ESTROGEN RECEPTOR ?NEGATIVE 0% ?POSITIVE =>1% ?REFERENCE RANGE PROGESTERONE RECEPTOR ?2 of 4 ?FINAL for Megna, Skyelyn L ([id number]) ?ADDITIONAL INFORMATION:(continued) ?NEGATIVE 0% ?POSITIVE =>1% ?  All controls stained appropriately ?Thressa Sheller MD ?Pathologist, Electronic Signature ?( Signed 08/01/2021) ?FINAL DIAGNOSIS ?Diagnosis ?1. Breast, left, needle core biopsy, 11 o'clock, 3cmfn (ribbon clip) ?- INVASIVE MAMMARY CARCINOMA. SEE NOTE ?2. Breast, left, needle core biopsy, 12 o'clock, 4cmfn (coil clip) ?- INVASIVE MAMMARY CARCINOMA. SEE NOTE ?Diagnosis Note ?2. Carcinoma measures 0.6 cm in part 1 and 0.8 cm in  part 2. Carcinoma from both parts shows a similar ?histomorphology and appears grade 2. Dr. Saralyn Pilar reviewed the case and concurs with the diagnosis. A breast ?prognostic profile (ER, PR, Ki-67 and HER2) and immunostain for E-cadherin

## 2021-09-11 NOTE — Anesthesia Preprocedure Evaluation (Signed)
Anesthesia Evaluation  ?Patient identified by MRN, date of birth, ID band ?Patient awake ? ? ? ?Reviewed: ?Allergy & Precautions, NPO status , Patient's Chart, lab work & pertinent test results ? ?Airway ?Mallampati: II ? ?TM Distance: >3 FB ?Neck ROM: Full ? ? ? Dental ?no notable dental hx. ? ?  ?Pulmonary ?asthma , former smoker,  ?  ?Pulmonary exam normal ?breath sounds clear to auscultation ? ? ? ? ? ? Cardiovascular ?negative cardio ROS ?Normal cardiovascular exam ?Rhythm:Regular Rate:Normal ? ? ?  ?Neuro/Psych ?Seizures: lumbar radiculopathy; sciatica.   Neuromuscular disease negative psych ROS  ? GI/Hepatic ?negative GI ROS, Neg liver ROS,   ?Endo/Other  ?prediabetes ? Renal/GU ?negative Renal ROS  ?negative genitourinary ?  ?Musculoskeletal ?negative musculoskeletal ROS ?(+)  ? Abdominal ?  ?Peds ?negative pediatric ROS ?(+)  Hematology ?negative hematology ROS ?(+)   ?Anesthesia Other Findings ?Breast cancer ? Reproductive/Obstetrics ?negative OB ROS ? ?  ? ? ? ? ? ? ? ? ? ? ? ? ? ?  ?  ? ? ? ? ? ? ? ? ?Anesthesia Physical ?Anesthesia Plan ? ?ASA: 2 ? ?Anesthesia Plan: General  ? ?Post-op Pain Management: Tylenol PO (pre-op)* and Minimal or no pain anticipated  ? ?Induction: Intravenous ? ?PONV Risk Score and Plan: 3 and Treatment may vary due to age or medical condition, Scopolamine patch - Pre-op, Ondansetron and Dexamethasone ? ?Airway Management Planned: LMA ? ?Additional Equipment: None ? ?Intra-op Plan:  ? ?Post-operative Plan: Extubation in OR ? ?Informed Consent: I have reviewed the patients History and Physical, chart, labs and discussed the procedure including the risks, benefits and alternatives for the proposed anesthesia with the patient or authorized representative who has indicated his/her understanding and acceptance.  ? ? ? ?Dental advisory given ? ?Plan Discussed with: CRNA, Anesthesiologist and Surgeon ? ?Anesthesia Plan Comments:    ? ? ? ? ? ? ?Anesthesia Quick Evaluation ? ?

## 2021-09-11 NOTE — Anesthesia Postprocedure Evaluation (Signed)
Anesthesia Post Note ? ?Patient: Nichole [name] ? ?Procedure(s) Performed: LEFT BREAST LUMPECTOMY WITH RADIOACTIVE SEED X3 AND LEFT SENTINEL LYMPH NODE BIOPSY (Left: Breast) ? ?  ? ?Patient location during evaluation: PACU ?Anesthesia Type: General ?Level of consciousness: awake ?Pain management: pain level controlled ?Vital Signs Assessment: post-procedure vital signs reviewed and stable ?Respiratory status: spontaneous breathing and respiratory function stable ?Cardiovascular status: stable ?Postop Assessment: no apparent nausea or vomiting ?Anesthetic complications: no ? ? ?No notable events documented. ? ?Last Vitals:  ?Vitals:  ? 09/11/21 1238 09/11/21 1253  ?BP: 109/74 118/78  ?Pulse: 60 63  ?Resp: 17 (!) 21  ?Temp:    ?SpO2: 98% 100%  ?  ?Last Pain:  ?Vitals:  ? 09/11/21 1238  ?TempSrc:   ?PainSc: Asleep  ? ? ?  ?  ?  ?  ?  ?  ? ?Merlinda Frederick ? ? ? ? ?

## 2021-09-11 NOTE — Anesthesia Procedure Notes (Signed)
Anesthesia Regional Block: Pectoralis block  ? ?Pre-Anesthetic Checklist: , timeout performed,  Correct Patient, Correct Site, Correct Laterality,  Correct Procedure, Correct Position, site marked,  Risks and benefits discussed,  Surgical consent,  Pre-op evaluation,  At surgeon's request and post-op pain management ? ?Laterality: Left ? ?Prep: chloraprep     ?  ?Needles:  ?Injection technique: Single-shot ? ?Needle Type: Echogenic Stimulator Needle   ? ? ?Needle Length: 10cm  ?Needle Gauge: 20  ? ? ? ?Additional Needles: ? ? ?Procedures:,,,, ultrasound used (permanent image in chart),,    ?Narrative:  ?Start time: 09/11/2021 10:00 AM ?End time: 09/11/2021 10:05 AM ?Injection made incrementally with aspirations every 5 mL. ? ?Performed by: Personally  ?Anesthesiologist: Merlinda Frederick, MD ? ?Additional Notes: ?Functioning IV was confirmed and monitors were applied.  Sterile prep and drape,hand hygiene and sterile gloves were used. Ultrasound guidance: relevant anatomy identified, needle position confirmed, local anesthetic spread visualized around nerve(s)., vascular puncture avoided. Negative aspiration and negative test dose prior to incremental administration of local anesthetic. The patient tolerated the procedure well. ? ? ? ? ? ? ?

## 2021-09-11 NOTE — Discharge Instructions (Signed)
[facility] Office Phone Number [phone]  BREAST BIOPSY/ PARTIAL MASTECTOMY: POST OP INSTRUCTIONS  Always review your discharge instruction sheet given to you by the facility where your surgery was performed.  IF YOU HAVE DISABILITY OR FAMILY LEAVE FORMS, YOU MUST BRING THEM TO THE OFFICE FOR PROCESSING.  DO NOT GIVE THEM TO YOUR DOCTOR.  A prescription for pain medication may be given to you upon discharge.  Take your pain medication as prescribed, if needed.  If narcotic pain medicine is not needed, then you may take acetaminophen (Tylenol) or ibuprofen (Advil) as needed. Take your usually prescribed medications unless otherwise directed If you need a refill on your pain medication, please contact your pharmacy.  They will contact our office to request authorization.  Prescriptions will not be filled after 5pm or on week-ends. You should eat very light the first 24 hours after surgery, such as soup, crackers, pudding, etc.  Resume your normal diet the day after surgery. Most patients will experience some swelling and bruising in the breast.  Ice packs and a good support bra will help.  Swelling and bruising can take several days to resolve.  It is common to experience some constipation if taking pain medication after surgery.  Increasing fluid intake and taking a stool softener will usually help or prevent this problem from occurring.  A mild laxative (Milk of Magnesia or Miralax) should be taken according to package directions if there are no bowel movements after 48 hours. Unless discharge instructions indicate otherwise, you may remove your bandages 24-48 hours after surgery, and you may shower at that time.  You may have steri-strips (small skin tapes) in place directly over the incision.  These strips should be left on the skin for 7-10 days.  If your surgeon used skin glue on the incision, you may shower in 24 hours.  The glue will flake off over the next 2-3 weeks.  Any  sutures or staples will be removed at the office during your follow-up visit. ACTIVITIES:  You may resume regular daily activities (gradually increasing) beginning the next day.  Wearing a good support bra or sports bra minimizes pain and swelling.  You may have sexual intercourse when it is comfortable. You may drive when you no longer are taking prescription pain medication, you can comfortably wear a seatbelt, and you can safely maneuver your car and apply brakes. RETURN TO WORK:  ______________________________________________________________________________________ You should see your doctor in the office for a follow-up appointment approximately two weeks after your surgery.  Your doctor's nurse will typically make your follow-up appointment when she calls you with your pathology report.  Expect your pathology report 2-3 business days after your surgery.  You may call to check if you do not hear from us after three days. OTHER INSTRUCTIONS: _______________________________________________________________________________________________ _____________________________________________________________________________________________________________________________________ _____________________________________________________________________________________________________________________________________ _____________________________________________________________________________________________________________________________________  WHEN TO CALL YOUR DOCTOR: Fever over 101.0 Nausea and/or vomiting. Extreme swelling or bruising. Continued bleeding from incision. Increased pain, redness, or drainage from the incision.  The clinic staff is available to answer your questions during regular business hours.  Please don't hesitate to call and ask to speak to one of the nurses for clinical concerns.  If you have a medical emergency, go to the nearest emergency room or call 911.  A surgeon from [facility] is always on call at the hospital.  For further questions, please visit [email or web address]   

## 2021-09-11 NOTE — Op Note (Signed)
Preoperative diagnosis: Stage I left breast cancer upper outer quadrant ? ?Postoperative diagnosis: Same ? ?Procedure: Left breast seed localized lumpectomy x 3 left axillary sentinel lymph node mapping with mag trace ? ? ?Surgeon Erroll Luna, MD ? ?Anesthesia: LMA with pectoral block and 0.25% Marcaine plain ? ?EBL: Minimal ? ?Specimen: Left breast tissue with localizing clips and seeds verified by Faxitron with grossly negative margins and 3 left axillary sentinel nodes ? ?Drains: None ? ?Indications for procedure: The patient is a 46 year old female with left breast cancer.  She was seen in the multidisciplinary clinic and evaluated by medical radiation oncology.  She chose breast conserving surgery.The procedure has been discussed with the patient. Alternatives to surgery have been discussed with the patient.  Risks of surgery include bleeding,  Infection,  Seroma formation, death,  and the need for further surgery.   The patient understands and wishes to proceed. Sentinel lymph node mapping and dissection has been discussed with the patient.  Risk of bleeding,  Infection,  Seroma formation,  Additional procedures,,  Shoulder weakness ,  Shoulder stiffness,  Nerve and blood vessel injury and reaction to the mapping dyes have been discussed.  Alternatives to surgery have been discussed with the patient.  The patient agrees to proceed.  ? ? ? ? ? ?Description of procedure: The patient was met in the holding area and questions were answered.  Left breast was marked as correct site and pectoral block placed by anesthesia.  Localizing seeds were placed as an outpatient.  All questions were answered.  She was taken back to the operating room.  She is placed supine upon the OR table.  After induction of general esthesia, left breast was prepped with alcohol and 2 cc of mag tracer were injected in a subareolar position and massaged for 5 minutes.  She was then reprepped and redraped in a sterile fashion and a second  timeout performed.  Proper patient, site and procedure verified.  Films were available for review.  Neoprobe used to identify the seeds.  There were 3 seeds in the central breast outlining 1 mass.  A transverse incision was made over the signal in the upper central left breast.  This extended to the left upper outer quadrant.  All tissue around all 3 seeds and clips were excised with a grossly negative margin.  Gross margins appeared negative.  The specimen was inked and sent to the Faxitron for imaging.  This verified the 3 seeds and 3 clips to be in the specimen.  This was then sent to pathology.  The cavity is irrigated.  Local anesthetic was infiltrated.  Hemostasis achieved with cautery.  We then closed the cavity with 3-0 Vicryl for Monocryl. ? ?Using the mag trace probe, hotspot identified in left axilla.  A 4 cm incision was made left axillary crease.  Dissection was carried down to the level 1 contents.  There were 3 hot nodes removed.  Background counts approached baseline.  These were level 1 nodes.  Irrigation was used.  Hemostasis achieved.  The deep tissue layers were closed with 3-0 Vicryl and skin closed with 4 Monocryl.  Dermabond applied.  Breast binder placed.  All counts found to be correct.  The patient was awoke extubated taken to recovery in satisfactory condition. ?

## 2021-09-11 NOTE — Interval H&P Note (Signed)
History and Physical Interval Note: ? ?09/11/2021 ?10:11 AM ? ?Nichole [name]  has presented today for surgery, with the diagnosis of LEFT BREAST CANCER.  The various methods of treatment have been discussed with the patient and family. After consideration of risks, benefits and other options for treatment, the patient has consented to  Procedure(s): ?LEFT BREAST LUMPECTOMY WITH RADIOACTIVE SEED X3 AND LEFT SENTINEL LYMPH NODE BIOPSY (Left) as a surgical intervention.  The patient's history has been reviewed, patient examined, no change in status, stable for surgery.  I have reviewed the patient's chart and labs.  Questions were answered to the patient's satisfaction.   ? ? ?[name] A Charlestine Rookstool ? ? ?

## 2021-09-12 ENCOUNTER — Encounter (HOSPITAL_COMMUNITY): Payer: Self-pay | Admitting: Surgery

## 2021-09-16 ENCOUNTER — Ambulatory Visit: Payer: BC Managed Care – PPO | Admitting: Hematology and Oncology

## 2021-09-17 ENCOUNTER — Ambulatory Visit: Payer: Self-pay | Admitting: Surgery

## 2021-09-17 ENCOUNTER — Encounter: Payer: Self-pay | Admitting: *Deleted

## 2021-09-18 ENCOUNTER — Encounter: Payer: Self-pay | Admitting: *Deleted

## 2021-09-19 ENCOUNTER — Encounter (HOSPITAL_BASED_OUTPATIENT_CLINIC_OR_DEPARTMENT_OTHER): Payer: Self-pay | Admitting: Surgery

## 2021-09-19 ENCOUNTER — Other Ambulatory Visit: Payer: Self-pay

## 2021-09-24 ENCOUNTER — Encounter (HOSPITAL_BASED_OUTPATIENT_CLINIC_OR_DEPARTMENT_OTHER): Payer: Self-pay | Admitting: Surgery

## 2021-09-24 ENCOUNTER — Ambulatory Visit (HOSPITAL_BASED_OUTPATIENT_CLINIC_OR_DEPARTMENT_OTHER): Payer: BC Managed Care – PPO | Admitting: Anesthesiology

## 2021-09-24 ENCOUNTER — Other Ambulatory Visit: Payer: Self-pay

## 2021-09-24 ENCOUNTER — Encounter (HOSPITAL_BASED_OUTPATIENT_CLINIC_OR_DEPARTMENT_OTHER)
Admission: RE | Disposition: A | Discharge status: Home / Self Care | Payer: Self-pay | Source: Home / Self Care | Attending: Surgery

## 2021-09-24 ENCOUNTER — Ambulatory Visit (HOSPITAL_BASED_OUTPATIENT_CLINIC_OR_DEPARTMENT_OTHER)
Admission: RE | Admit: 2021-09-24 | Discharge: 2021-09-24 | Disposition: A | Discharge status: Home / Self Care | Payer: BC Managed Care – PPO | Attending: Surgery | Admitting: Surgery

## 2021-09-24 DIAGNOSIS — G709 Myoneural disorder, unspecified: Secondary | ICD-10-CM | POA: Diagnosis not present

## 2021-09-24 DIAGNOSIS — J45909 Unspecified asthma, uncomplicated: Secondary | ICD-10-CM | POA: Diagnosis not present

## 2021-09-24 DIAGNOSIS — M5416 Radiculopathy, lumbar region: Secondary | ICD-10-CM | POA: Insufficient documentation

## 2021-09-24 DIAGNOSIS — N6022 Fibroadenosis of left breast: Secondary | ICD-10-CM | POA: Diagnosis not present

## 2021-09-24 DIAGNOSIS — Z87891 Personal history of nicotine dependence: Secondary | ICD-10-CM | POA: Diagnosis not present

## 2021-09-24 DIAGNOSIS — M96843 Postprocedural seroma of a musculoskeletal structure following other procedure: Secondary | ICD-10-CM | POA: Diagnosis not present

## 2021-09-24 DIAGNOSIS — R7303 Prediabetes: Secondary | ICD-10-CM | POA: Diagnosis not present

## 2021-09-24 DIAGNOSIS — Y838 Other surgical procedures as the cause of abnormal reaction of the patient, or of later complication, without mention of misadventure at the time of the procedure: Secondary | ICD-10-CM | POA: Diagnosis not present

## 2021-09-24 DIAGNOSIS — C50912 Malignant neoplasm of unspecified site of left female breast: Secondary | ICD-10-CM | POA: Diagnosis not present

## 2021-09-24 DIAGNOSIS — N6032 Fibrosclerosis of left breast: Secondary | ICD-10-CM | POA: Diagnosis not present

## 2021-09-24 DIAGNOSIS — C50412 Malignant neoplasm of upper-outer quadrant of left female breast: Secondary | ICD-10-CM | POA: Diagnosis not present

## 2021-09-24 HISTORY — PX: RE-EXCISION OF BREAST LUMPECTOMY: SHX6048

## 2021-09-24 SURGERY — EXCISION, LESION, BREAST
Anesthesia: General | Site: Breast | Laterality: Left

## 2021-09-24 MED ORDER — CHLORHEXIDINE GLUCONATE CLOTH 2 % EX PADS: 6.0000 | MEDICATED_PAD | Freq: Once | CUTANEOUS | Status: DC

## 2021-09-24 MED ORDER — ACETAMINOPHEN 500 MG PO TABS
ORAL_TABLET | ORAL | Status: AC
  Filled 2021-09-24: qty 1

## 2021-09-24 MED ORDER — DEXAMETHASONE SODIUM PHOSPHATE 10 MG/ML IJ SOLN
INTRAMUSCULAR | Status: AC
  Filled 2021-09-24: qty 1

## 2021-09-24 MED ORDER — ONDANSETRON HCL 4 MG/2ML IJ SOLN
INTRAMUSCULAR | Status: DC | PRN
  Administered 2021-09-24: 4 mg via INTRAVENOUS

## 2021-09-24 MED ORDER — DEXAMETHASONE SODIUM PHOSPHATE 4 MG/ML IJ SOLN
INTRAMUSCULAR | Status: DC | PRN
Start: 2021-09-24 — End: 2021-09-24
  Administered 2021-09-24: 5 mg via INTRAVENOUS

## 2021-09-24 MED ORDER — PROPOFOL 10 MG/ML IV BOLUS
INTRAVENOUS | Status: DC | PRN
  Administered 2021-09-24: 200 mg via INTRAVENOUS

## 2021-09-24 MED ORDER — OXYCODONE HCL 5 MG PO TABS: 5.0000 mg | ORAL_TABLET | Freq: Four times a day (QID) | ORAL | 0 refills | Status: DC | PRN

## 2021-09-24 MED ORDER — CEFAZOLIN SODIUM-DEXTROSE 2-4 GM/100ML-% IV SOLN
2.0000 g | INTRAVENOUS | Status: AC
  Administered 2021-09-24: 2 g via INTRAVENOUS

## 2021-09-24 MED ORDER — FENTANYL CITRATE (PF) 100 MCG/2ML IJ SOLN
INTRAMUSCULAR | Status: DC | PRN
  Administered 2021-09-24 (×2): 50 ug via INTRAVENOUS

## 2021-09-24 MED ORDER — ONDANSETRON HCL 4 MG/2ML IJ SOLN
INTRAMUSCULAR | Status: AC
  Filled 2021-09-24: qty 2

## 2021-09-24 MED ORDER — ACETAMINOPHEN 500 MG PO TABS
ORAL_TABLET | ORAL | Status: AC
  Filled 2021-09-24: qty 2

## 2021-09-24 MED ORDER — PROPOFOL 10 MG/ML IV BOLUS
INTRAVENOUS | Status: AC
  Filled 2021-09-24: qty 20

## 2021-09-24 MED ORDER — LACTATED RINGERS IV SOLN: INTRAVENOUS | Status: DC

## 2021-09-24 MED ORDER — MIDAZOLAM HCL 2 MG/2ML IJ SOLN
INTRAMUSCULAR | Status: AC
Start: 2021-09-24 — End: ?
  Filled 2021-09-24: qty 2

## 2021-09-24 MED ORDER — SODIUM CHLORIDE 0.9 % IV SOLN
INTRAVENOUS | Status: DC | PRN
  Administered 2021-09-24: 500 mL

## 2021-09-24 MED ORDER — LIDOCAINE 2% (20 MG/ML) 5 ML SYRINGE
INTRAMUSCULAR | Status: AC
  Filled 2021-09-24: qty 5

## 2021-09-24 MED ORDER — FENTANYL CITRATE (PF) 100 MCG/2ML IJ SOLN
INTRAMUSCULAR | Status: AC
  Filled 2021-09-24: qty 2

## 2021-09-24 MED ORDER — EPHEDRINE SULFATE (PRESSORS) 50 MG/ML IJ SOLN
INTRAMUSCULAR | Status: DC | PRN
  Administered 2021-09-24: 10 mg via INTRAVENOUS

## 2021-09-24 MED ORDER — BUPIVACAINE-EPINEPHRINE 0.25% -1:200000 IJ SOLN
INTRAMUSCULAR | Status: DC | PRN
  Administered 2021-09-24: 10 mL

## 2021-09-24 MED ORDER — CEFAZOLIN SODIUM-DEXTROSE 2-4 GM/100ML-% IV SOLN
INTRAVENOUS | Status: AC
  Filled 2021-09-24: qty 100

## 2021-09-24 MED ORDER — LIDOCAINE HCL (CARDIAC) PF 100 MG/5ML IV SOSY
PREFILLED_SYRINGE | INTRAVENOUS | Status: DC | PRN
  Administered 2021-09-24: 60 mg via INTRAVENOUS

## 2021-09-24 MED ORDER — OXYCODONE HCL 5 MG PO TABS
ORAL_TABLET | ORAL | Status: AC
  Filled 2021-09-24: qty 1

## 2021-09-24 MED ORDER — CHLORHEXIDINE GLUCONATE CLOTH 2 % EX PADS
6.0000 | MEDICATED_PAD | Freq: Once | CUTANEOUS | Status: DC
Start: 2021-09-24 — End: 2021-09-24

## 2021-09-24 MED ORDER — AMISULPRIDE (ANTIEMETIC) 5 MG/2ML IV SOLN: 10.0000 mg | Freq: Once | INTRAVENOUS | Status: DC | PRN

## 2021-09-24 MED ORDER — OXYCODONE HCL 5 MG/5ML PO SOLN: 5.0000 mg | Freq: Once | ORAL | Status: AC | PRN

## 2021-09-24 MED ORDER — MIDAZOLAM HCL 5 MG/5ML IJ SOLN
INTRAMUSCULAR | Status: DC | PRN
  Administered 2021-09-24: 2 mg via INTRAVENOUS

## 2021-09-24 MED ORDER — OXYCODONE HCL 5 MG PO TABS
5.0000 mg | ORAL_TABLET | Freq: Once | ORAL | Status: AC | PRN
  Administered 2021-09-24: 5 mg via ORAL

## 2021-09-24 MED ORDER — FENTANYL CITRATE (PF) 100 MCG/2ML IJ SOLN
25.0000 ug | INTRAMUSCULAR | Status: DC | PRN
  Administered 2021-09-24 (×2): 50 ug via INTRAVENOUS

## 2021-09-24 MED ORDER — ACETAMINOPHEN 500 MG PO TABS
1000.0000 mg | ORAL_TABLET | Freq: Once | ORAL | Status: AC
Start: 2021-09-24 — End: 2021-09-24
  Administered 2021-09-24: 1000 mg via ORAL

## 2021-09-24 SURGICAL SUPPLY — 54 items
ADH SKN CLS APL DERMABOND .7 (GAUZE/BANDAGES/DRESSINGS) ×1
APL PRP STRL LF DISP 70% ISPRP (MISCELLANEOUS) ×1
APPLIER CLIP 9.375 MED OPEN (MISCELLANEOUS)
APR CLP MED 9.3 20 MLT OPN (MISCELLANEOUS)
BINDER BREAST LRG (GAUZE/BANDAGES/DRESSINGS) IMPLANT
BINDER BREAST MEDIUM (GAUZE/BANDAGES/DRESSINGS) IMPLANT
BINDER BREAST XLRG (GAUZE/BANDAGES/DRESSINGS) IMPLANT
BINDER BREAST XXLRG (GAUZE/BANDAGES/DRESSINGS) ×1 IMPLANT
BLADE SURG 15 STRL LF DISP TIS (BLADE) ×1 IMPLANT
BLADE SURG 15 STRL SS (BLADE) ×2
CANISTER SUCT 1200ML W/VALVE (MISCELLANEOUS) ×2 IMPLANT
CHLORAPREP W/TINT 26 (MISCELLANEOUS) ×2 IMPLANT
CLIP APPLIE 9.375 MED OPEN (MISCELLANEOUS) IMPLANT
COVER BACK TABLE 60X90IN (DRAPES) ×2 IMPLANT
COVER MAYO STAND STRL (DRAPES) ×2 IMPLANT
DERMABOND ADVANCED (GAUZE/BANDAGES/DRESSINGS) ×1
DERMABOND ADVANCED .7 DNX12 (GAUZE/BANDAGES/DRESSINGS) ×1 IMPLANT
DRAPE LAPAROSCOPIC ABDOMINAL (DRAPES) IMPLANT
DRAPE LAPAROTOMY 100X72 PEDS (DRAPES) ×2 IMPLANT
DRAPE UTILITY XL STRL (DRAPES) ×2 IMPLANT
ELECT COATED BLADE 2.86 ST (ELECTRODE) ×2 IMPLANT
ELECT REM PT RETURN 9FT ADLT (ELECTROSURGICAL) ×2
ELECTRODE REM PT RTRN 9FT ADLT (ELECTROSURGICAL) ×1 IMPLANT
GLOVE BIOGEL PI IND STRL 7.0 (GLOVE) IMPLANT
GLOVE BIOGEL PI IND STRL 8 (GLOVE) ×1 IMPLANT
GLOVE BIOGEL PI INDICATOR 7.0 (GLOVE) ×2
GLOVE BIOGEL PI INDICATOR 8 (GLOVE) ×1
GLOVE ECLIPSE 8.0 STRL XLNG CF (GLOVE) ×2 IMPLANT
GLOVE SURG SYN 7.5  E (GLOVE) ×1
GLOVE SURG SYN 7.5 E (GLOVE) ×1 IMPLANT
GLOVE SURG SYN 7.5 PF PI (GLOVE) IMPLANT
GOWN STRL REUS W/ TWL LRG LVL3 (GOWN DISPOSABLE) ×2 IMPLANT
GOWN STRL REUS W/ TWL XL LVL3 (GOWN DISPOSABLE) ×1 IMPLANT
GOWN STRL REUS W/TWL LRG LVL3 (GOWN DISPOSABLE)
GOWN STRL REUS W/TWL XL LVL3 (GOWN DISPOSABLE) ×4
HEMOSTAT ARISTA ABSORB 3G PWDR (HEMOSTASIS) IMPLANT
KIT MARKER MARGIN INK (KITS) ×1 IMPLANT
NDL HYPO 25X1 1.5 SAFETY (NEEDLE) ×1 IMPLANT
NEEDLE HYPO 25X1 1.5 SAFETY (NEEDLE) ×2 IMPLANT
NS IRRIG 1000ML POUR BTL (IV SOLUTION) ×1 IMPLANT
PACK BASIN DAY SURGERY FS (CUSTOM PROCEDURE TRAY) ×2 IMPLANT
PENCIL SMOKE EVACUATOR (MISCELLANEOUS) ×2 IMPLANT
SLEEVE SCD COMPRESS KNEE MED (STOCKING) ×2 IMPLANT
SPIKE FLUID TRANSFER (MISCELLANEOUS) ×1 IMPLANT
SPONGE T-LAP 4X18 ~~LOC~~+RFID (SPONGE) ×2 IMPLANT
STAPLER VISISTAT 35W (STAPLE) IMPLANT
SUT MON AB 4-0 PC3 18 (SUTURE) ×2 IMPLANT
SUT SILK 2 0 SH (SUTURE) IMPLANT
SUT VICRYL 3-0 CR8 SH (SUTURE) ×2 IMPLANT
SYR CONTROL 10ML LL (SYRINGE) ×2 IMPLANT
TOWEL GREEN STERILE FF (TOWEL DISPOSABLE) ×4 IMPLANT
TRAY FAXITRON CT DISP (TRAY / TRAY PROCEDURE) IMPLANT
TUBE CONNECTING 20X1/4 (TUBING) ×2 IMPLANT
YANKAUER SUCT BULB TIP NO VENT (SUCTIONS) ×2 IMPLANT

## 2021-09-24 NOTE — Anesthesia Procedure Notes (Signed)
Procedure Name: LMA Insertion ?Date/Time: 09/24/2021 3:38 AM ?Performed by: Maryella Shivers, CRNA ?Pre-anesthesia Checklist: Patient identified, Emergency Drugs available, Suction available and Patient being monitored ?Patient Re-evaluated:Patient Re-evaluated prior to induction ?Oxygen Delivery Method: Circle system utilized ?Preoxygenation: Pre-oxygenation with 100% oxygen ?Induction Type: IV induction ?Ventilation: Mask ventilation without difficulty ?LMA: LMA inserted ?LMA Size: 4.0 ?Number of attempts: 1 ?Airway Equipment and Method: Bite block ?Placement Confirmation: positive ETCO2 ?Tube secured with: Tape ?Dental Injury: Teeth and Oropharynx as per pre-operative assessment  ? ? ? ? ?

## 2021-09-24 NOTE — Anesthesia Preprocedure Evaluation (Signed)
Anesthesia Evaluation  ?Patient identified by MRN, date of birth, ID band ?Patient awake ? ? ? ?Reviewed: ?Allergy & Precautions, NPO status , Patient's Chart, lab work & pertinent test results ? ?Airway ?Mallampati: II ? ?TM Distance: >3 FB ?Neck ROM: Full ? ? ? Dental ?no notable dental hx. ? ?  ?Pulmonary ?asthma , former smoker,  ?  ?Pulmonary exam normal ?breath sounds clear to auscultation ? ? ? ? ? ? Cardiovascular ?negative cardio ROS ?Normal cardiovascular exam ?Rhythm:Regular Rate:Normal ? ? ?  ?Neuro/Psych ?Seizures: lumbar radiculopathy; sciatica.   Neuromuscular disease negative psych ROS  ? GI/Hepatic ?negative GI ROS, Neg liver ROS,   ?Endo/Other  ?prediabetes ? Renal/GU ?negative Renal ROS  ?negative genitourinary ?  ?Musculoskeletal ?negative musculoskeletal ROS ?(+)  ? Abdominal ?  ?Peds ?negative pediatric ROS ?(+)  Hematology ?negative hematology ROS ?(+)   ?Anesthesia Other Findings ?Breast cancer ? Reproductive/Obstetrics ?negative OB ROS ? ?  ? ? ? ? ? ? ? ? ? ? ? ? ? ?  ?  ? ? ? ? ? ? ? ? ?Anesthesia Physical ? ?Anesthesia Plan ? ?ASA: 2 ? ?Anesthesia Plan: General  ? ?Post-op Pain Management: Tylenol PO (pre-op)*, Minimal or no pain anticipated and Toradol IV (intra-op)*  ? ?Induction: Intravenous ? ?PONV Risk Score and Plan: 3 and Treatment may vary due to age or medical condition, Scopolamine patch - Pre-op, Ondansetron and Dexamethasone ? ?Airway Management Planned: LMA ? ?Additional Equipment: None ? ?Intra-op Plan:  ? ?Post-operative Plan: Extubation in OR ? ?Informed Consent: I have reviewed the patients History and Physical, chart, labs and discussed the procedure including the risks, benefits and alternatives for the proposed anesthesia with the patient or authorized representative who has indicated his/her understanding and acceptance.  ? ? ? ?Dental advisory given ? ?Plan Discussed with: CRNA, Anesthesiologist and Surgeon ? ?Anesthesia Plan  Comments:   ? ? ? ? ? ? ?Anesthesia Quick Evaluation ? ?

## 2021-09-24 NOTE — Interval H&P Note (Signed)
History and Physical Interval Note: ? ?09/24/2021 ?12:55 PM ? ?Nichole [name]  has presented today for surgery, with the diagnosis of LEFT BREAST CANCER.  The various methods of treatment have been discussed with the patient and family. After consideration of risks, benefits and other options for treatment, the patient has consented to  Procedure(s): ?RE-EXCISION OF LEFT BREAST LUMPECTOMY (Left) as a surgical intervention.  The patient's history has been reviewed, patient examined, no change in status, stable for surgery.  I have reviewed the patient's chart and labs.  Questions were answered to the patient's satisfaction.   ? ? ?Nichole [name] ? ? ?

## 2021-09-24 NOTE — Anesthesia Postprocedure Evaluation (Signed)
Anesthesia Post Note ? ?Patient: Nichole [name] ? ?Procedure(s) Performed: RE-EXCISION OF LEFT BREAST LUMPECTOMY (Left: Breast) ? ?  ? ?Patient location during evaluation: PACU ?Anesthesia Type: General ?Level of consciousness: awake and alert ?Pain management: pain level controlled ?Vital Signs Assessment: post-procedure vital signs reviewed and stable ?Respiratory status: spontaneous breathing, nonlabored ventilation, respiratory function stable and patient connected to nasal cannula oxygen ?Cardiovascular status: blood pressure returned to baseline and stable ?Postop Assessment: no apparent nausea or vomiting ?Anesthetic complications: no ? ? ?No notable events documented. ? ?Last Vitals:  ?Vitals:  ? 09/24/21 1500 09/24/21 1515  ?BP: 132/75 (!) 141/76  ?Pulse: 66 70  ?Resp: 13 16  ?Temp:  36.6 ?C  ?SpO2: 98% 98%  ?  ?Last Pain:  ?Vitals:  ? 09/24/21 1515  ?TempSrc:   ?PainSc: 5   ? ? ?  ?  ?  ?  ?  ?  ? ?Suzette Battiest E ? ? ? ? ?

## 2021-09-24 NOTE — Discharge Instructions (Addendum)
[facility] ?Office Phone Number [phone] ? ?BREAST BIOPSY/ PARTIAL MASTECTOMY: POST OP INSTRUCTIONS ? ?Always review your discharge instruction sheet given to you by the facility where your surgery was performed. ? ?IF YOU HAVE DISABILITY OR FAMILY LEAVE FORMS, YOU MUST BRING THEM TO THE OFFICE FOR PROCESSING.  DO NOT GIVE THEM TO YOUR DOCTOR. ? ?A prescription for pain medication may be given to you upon discharge.  Take your pain medication as prescribed, if needed.  If narcotic pain medicine is not needed, then you may take acetaminophen (Tylenol) or ibuprofen (Advil) as needed *You had 1000 mg of Tylenol at 12:00 pm ?Take your usually prescribed medications unless otherwise directed ?If you need a refill on your pain medication, please contact your pharmacy.  They will contact our office to request authorization.  Prescriptions will not be filled after 5pm or on week-ends. ?You should eat very light the first 24 hours after surgery, such as soup, crackers, pudding, etc.  Resume your normal diet the day after surgery. ?Most patients will experience some swelling and bruising in the breast.  Ice packs and a good support bra will help.  Swelling and bruising can take several days to resolve.  ?It is common to experience some constipation if taking pain medication after surgery.  Increasing fluid intake and taking a stool softener will usually help or prevent this problem from occurring.  A mild laxative (Milk of Magnesia or Miralax) should be taken according to package directions if there are no bowel movements after 48 hours. ?Unless discharge instructions indicate otherwise, you may remove your bandages 24-48 hours after surgery, and you may shower at that time.  You may have steri-strips (small skin tapes) in place directly over the incision.  These strips should be left on the skin for 7-10 days.  If your surgeon used skin glue on the incision, you may shower in 24 hours.  The glue will  flake off over the next 2-3 weeks.  Any sutures or staples will be removed at the office during your follow-up visit. ?ACTIVITIES:  You may resume regular daily activities (gradually increasing) beginning the next day.  Wearing a good support bra or sports bra minimizes pain and swelling.  You may have sexual intercourse when it is comfortable. ?You may drive when you no longer are taking prescription pain medication, you can comfortably wear a seatbelt, and you can safely maneuver your car and apply brakes. ?RETURN TO WORK:  ______________________________________________________________________________________ ?You should see your doctor in the office for a follow-up appointment approximately two weeks after your surgery.  Your doctor?s nurse will typically make your follow-up appointment when she calls you with your pathology report.  Expect your pathology report 2-3 business days after your surgery.  You may call to check if you do not hear from [place] after three days. ?OTHER INSTRUCTIONS: _______________________________________________________________________________________________ _____________________________________________________________________________________________________________________________________ ?_____________________________________________________________________________________________________________________________________ ?_____________________________________________________________________________________________________________________________________ ? ?WHEN TO CALL YOUR DOCTOR: ?Fever over 101.0 ?Nausea and/or vomiting. ?Extreme swelling or bruising. ?Continued bleeding from incision. ?Increased pain, redness, or drainage from the incision. ? ?The clinic staff is available to answer your questions during regular business hours.  Please don?t hesitate to call and ask to speak to one of the nurses for clinical concerns.  If you have a medical emergency, go to the nearest emergency room  or call 911.  A surgeon from [facility] is always on call at the hospital. ? ?For further questions, please visit [email or web address]   ? ? ?Post Anesthesia Home Care Instructions ? ?  Activity: ?Get plenty of rest for the remainder of the day. A responsible individual must stay with you for 24 hours following the procedure.  ?For the next 24 hours, DO NOT: ?-Drive a car ?-Paediatric nurse ?-Drink alcoholic beverages ?-Take any medication unless instructed by your physician ?-Make any legal decisions or sign important papers. ? ?Meals: ?Start with liquid foods such as gelatin or soup. Progress to regular foods as tolerated. Avoid greasy, spicy, heavy foods. If nausea and/or vomiting occur, drink only clear liquids until the nausea and/or vomiting subsides. Call your physician if vomiting continues. ? ?Special Instructions/Symptoms: ?Your throat may feel dry or sore from the anesthesia or the breathing tube placed in your throat during surgery. If this causes discomfort, gargle with warm salt water. The discomfort should disappear within 24 hours. ? ?If you had a scopolamine patch placed behind your ear for the management of post- operative nausea and/or vomiting: ? ?1. The medication in the patch is effective for 72 hours, after which it should be removed.  Wrap patch in a tissue and discard in the trash. Wash hands thoroughly with soap and water. ?2. You may remove the patch earlier than 72 hours if you experience unpleasant side effects which may include dry mouth, dizziness or visual disturbances. ?3. Avoid touching the patch. Wash your hands with soap and water after contact with the patch. ?    ?

## 2021-09-24 NOTE — H&P (Signed)
Nichole [name] is an 46 y.o. female.   ?Chief Complaint: Left breast cancer ?HPI: Patient presents today for reexcision left breast lumpectomy.  She underwent left breast lumpectomy last month with sentinel lymph node mapping for stage I left breast cancer.  She presents today for excision of a positive lateral margin. ? ?Past Medical History:  ?Diagnosis Date  ? Asthma   ? exercise induced asthma  ? Cancer ([place]) 07/29/2021  ? left breast  ? Family history of breast cancer 08/14/2021  ? Family history of kidney cancer 08/14/2021  ? Pre-diabetes   ? ? ?Past Surgical History:  ?Procedure Laterality Date  ? BIOPSY  03/04/2021  ? Procedure: BIOPSY;  Surgeon: Felicie Morn, MD;  Location: [facility];  Service: General;;  ? BREAST BIOPSY Left 07/30/2021  ? BREAST BIOPSY Right 09/01/2021  ? BREAST BIOPSY Left 09/05/2021  ? BREAST LUMPECTOMY WITH RADIOACTIVE SEED AND SENTINEL LYMPH NODE BIOPSY Left 09/11/2021  ? Procedure: LEFT BREAST LUMPECTOMY WITH RADIOACTIVE SEED X3 AND LEFT SENTINEL LYMPH NODE BIOPSY;  Surgeon: Erroll Luna, MD;  Location: [facility];  Service: General;  Laterality: Left;  ? ESOPHAGOGASTRODUODENOSCOPY N/A 03/04/2021  ? Procedure: ESOPHAGOGASTRODUODENOSCOPY (EGD);  Surgeon: Felicie Morn, MD;  Location: [facility];  Service: General;  Laterality: N/A;  ? LAPAROSCOPIC GASTRIC SLEEVE RESECTION N/A 05/02/2021  ? Procedure: LAPAROSCOPIC GASTRIC SLEEVE RESECTION;  Surgeon: Felicie Morn, MD;  Location: [facility];  Service: General;  Laterality: N/A;  ? LAPAROSCOPIC VAGINAL HYSTERECTOMY WITH SALPINGECTOMY N/A 10/08/2016  ? Procedure: LAPAROSCOPIC ASSISTED VAGINAL HYSTERECTOMY WITH RIGHT SALPINGECTOMY LYSIS OF ADHESIONS;  Surgeon: Donnamae Jude, MD;  Location: [facility];  Service: Gynecology;  Laterality: N/A;  ? MYOMECTOMY  2014  ? Right Oophrectomy    ? UPPER GI ENDOSCOPY N/A 05/02/2021  ? Procedure: UPPER GI ENDOSCOPY;  Surgeon: Felicie Morn, MD;  Location: [facility];  Service:  General;  Laterality: N/A;  ? WISDOM TOOTH EXTRACTION    ? ? ?Family History  ?Problem Relation Age of Onset  ? Arthritis Mother   ? Breast cancer Mother 7  ?     Lumpectomy  ? Hyperlipidemia Mother   ? Hypertension Mother   ? Kidney cancer Mother 47  ?     Had ablation  ? Graves' disease Mother   ?     Had thyroid removed  ? Graves' disease Brother   ?     Deceased  ? ?Social History:  reports that she quit smoking about 4 years ago. Her smoking use included cigarettes. She has a 1.50 pack-year smoking history. She has never used smokeless tobacco. She reports that she does not currently use alcohol. She reports that she does not currently use drugs. ? ?Allergies: No Known Allergies ? ?Medications Prior to Admission  ?Medication Sig Dispense Refill  ? CALCIUM PO Take 1 tablet by mouth 3 (three) times daily.    ? cetirizine (ZYRTEC) 10 MG tablet Take 1 tablet (10 mg total) by mouth at bedtime. For allergies 90 tablet 0  ? fluticasone (FLONASE) 50 MCG/ACT nasal spray Place 1 spray into both nostrils 2 (two) times daily as needed for allergies or rhinitis. 48 mL 0  ? ibuprofen (ADVIL) 800 MG tablet Take 1 tablet (800 mg total) by mouth every 8 (eight) hours as needed. 30 tablet 0  ? linaclotide (LINZESS) 72 MCG capsule Take 72 mcg by mouth daily as needed (constipation).    ? Multiple Vitamin (MULTI-VITAMIN) tablet Take 1 tablet by mouth  daily.    ? oxyCODONE (OXY IR/ROXICODONE) 5 MG immediate release tablet Take 1 tablet (5 mg total) by mouth every 6 (six) hours as needed for severe pain. 15 tablet 0  ? albuterol (VENTOLIN HFA) 108 (90 Base) MCG/ACT inhaler Inhale 1-2 puffs into the lungs every 6 (six) hours as needed for wheezing or shortness of breath. 1 each 0  ? ? ?No results found for this or any previous visit (from the past 48 hour(s)). ?No results found. ? ?Review of Systems  ?All other systems reviewed and are negative. ? ?Blood pressure 129/77, pulse 78, temperature 98.5 ?F (36.9 ?C), temperature source  Oral, resp. rate 18, height '5\' 5"'$  (1.651 m), weight 97.4 kg, last menstrual period 09/09/2016, SpO2 100 %. ?Physical Exam ?Constitutional:   ?   Appearance: Normal appearance.  ?HENT:  ?   Head: Normocephalic.  ?Cardiovascular:  ?   Rate and Rhythm: Normal rate.  ?Chest:  ? ? ?   Comments: Left breast incision clean dry intact ?Left axillar incision clean dry intact ?Musculoskeletal:     ?   General: Normal range of motion.  ?Skin: ?   General: Skin is warm.  ?Neurological:  ?   Mental Status: She is alert.  ?Psychiatric:     ?   Mood and Affect: Mood normal.     ?   Behavior: Behavior normal.  ?  ? ?Assessment/Plan ?Left breast cancer-presents for reexcision left breast lumpectomy. ?The procedure has been discussed with the patient. Alternatives to surgery have been discussed with the patient.  Risks of surgery include bleeding,  Infection,  Seroma formation, death,  and the need for further surgery.   The patient understands and wishes to proceed.  ? ?Turner Daniels, MD ?09/24/2021, 12:53 PM ? ? ? ?

## 2021-09-24 NOTE — Op Note (Signed)
Left Breast Re-excison Lumpectomy Procedure Note ? ?Indications:  This patient returns following an initial lumpectomy.  Analysis of the pathology specimen revealed microscopic involvement of the lateral margins.  The patient now returns for re-excision. ? ?Pre-operative Diagnosis: left breast cancer ? ?Post-operative Diagnosis: left breast cancer ? ?Surgeon: Turner Daniels  MD  ? ?Assistants: NONE ? ?Anesthesia: General LMA anesthesia and Local anesthesia 0.25.% bupivacaine ? ?ASA Class: 2 ? ?Procedure Details  ?The patient was seen in the Holding Room. The risks, benefits, complications, treatment options, and expected outcomes were discussed with the patient. The possibilities of reaction to medication, pulmonary aspiration, bleeding, infection, the need for additional procedures, failure to diagnose a condition, and creating a complication requiring transfusion or operation were discussed with the patient. The patient concurred with the proposed plan, giving informed consent.  The site of surgery properly noted/marked. The patient was taken to Operating Room # 2, identified as [name] and the procedure verified as  left Breast Re-excision Lumpectomy. A Time Out was held and the above information confirmed. ? ?the patient was placed supine.  The breast was prepped and draped in standard fashion. Marcaine 0.5% with epinephrine was used to anesthetize the skin around the previous lumpectomy incision.  The incision was opened.  A  seroma was evacuated.  Additional local anesthesia was delivered laterally within the lumpectomy cavity.  A full thickness re-excision was performed.  An orientation suture was placed anteriorly.  The new margin was inked and the specimen was submitted to pathology.  Hemostasis was achieved with cautery.  Closure was performed in 2 layers with a 3 -0 Vicryl  and 4 O monocryl subcuticular closure.   ? ?Steri-Strips were applied.  At the end of the operation, all sponge,  instrument and needle counts were correct.  ? ?Findings: ?grossly clear surgical margins ? ?Estimated Blood Loss:  Minimal ?        ?Drains: none ?        ?Total IV Fluids per OR record ?        ?Specimens: lateral margin ?        ? ?        ?Complications:  None; patient tolerated the procedure well. ?        ?Disposition: PACU - hemodynamically stable. ?        ?Condition: stable ? ?Attending Attestation: I performed the procedure.   ?

## 2021-09-24 NOTE — Transfer of Care (Signed)
Immediate Anesthesia Transfer of Care Note ? ?Patient: Nichole [name] ? ?Procedure(s) Performed: RE-EXCISION OF LEFT BREAST LUMPECTOMY (Left: Breast) ? ?Patient Location: PACU ? ?Anesthesia Type:General ? ?Level of Consciousness: sedated ? ?Airway & Oxygen Therapy: Patient Spontanous Breathing and Patient connected to face mask oxygen ? ?Post-op Assessment: Report given to RN and Post -op Vital signs reviewed and stable ? ?Post vital signs: Reviewed and stable ? ?Last Vitals:  ?Vitals Value Taken Time  ?BP 131/81 09/24/21 1417  ?Temp    ?Pulse 89 09/24/21 1418  ?Resp 22 09/24/21 1418  ?SpO2 99 % 09/24/21 1418  ?Vitals shown include unvalidated device data. ? ?Last Pain:  ?Vitals:  ? 09/24/21 1153  ?TempSrc: Oral  ?PainSc: 2   ?   ? ?Patients Stated Pain Goal: 5 (09/24/21 1153) ? ?Complications: No notable events documented. ?

## 2021-09-24 NOTE — Anesthesia Postprocedure Evaluation (Deleted)
Anesthesia Post Note ? ?Patient: Nichole [name] ? ?Procedure(s) Performed: RE-EXCISION OF LEFT BREAST LUMPECTOMY (Left: Breast) ? ?  ? ?Patient location during evaluation: PACU ?Anesthesia Type: General ?Level of consciousness: awake and alert ?Pain management: pain level controlled ?Vital Signs Assessment: post-procedure vital signs reviewed and stable ?Respiratory status: spontaneous breathing, nonlabored ventilation, respiratory function stable and patient connected to nasal cannula oxygen ?Cardiovascular status: blood pressure returned to baseline and stable ?Postop Assessment: no apparent nausea or vomiting ?Anesthetic complications: no ? ? ?No notable events documented. ? ?Last Vitals:  ?Vitals:  ? 09/24/21 1153  ?BP: 129/77  ?Pulse: 78  ?Resp: 18  ?Temp: 36.9 ?C  ?SpO2: 100%  ?  ?Last Pain:  ?Vitals:  ? 09/24/21 1153  ?TempSrc: Oral  ?PainSc: 2   ? ? ?  ?  ?  ?  ?  ?  ? ?Suzette Battiest E ? ? ? ? ?

## 2021-09-25 ENCOUNTER — Encounter (HOSPITAL_COMMUNITY): Payer: Self-pay

## 2021-09-25 ENCOUNTER — Encounter (HOSPITAL_BASED_OUTPATIENT_CLINIC_OR_DEPARTMENT_OTHER): Payer: Self-pay | Admitting: Surgery

## 2021-09-25 ENCOUNTER — Inpatient Hospital Stay: Payer: BC Managed Care – PPO | Attending: Hematology and Oncology | Admitting: Hematology and Oncology

## 2021-09-25 ENCOUNTER — Encounter: Payer: Self-pay | Admitting: *Deleted

## 2021-09-25 DIAGNOSIS — Z803 Family history of malignant neoplasm of breast: Secondary | ICD-10-CM | POA: Insufficient documentation

## 2021-09-25 DIAGNOSIS — Z170 Estrogen receptor positive status [ER+]: Secondary | ICD-10-CM | POA: Diagnosis not present

## 2021-09-25 DIAGNOSIS — Z87891 Personal history of nicotine dependence: Secondary | ICD-10-CM | POA: Diagnosis not present

## 2021-09-25 DIAGNOSIS — Z8051 Family history of malignant neoplasm of kidney: Secondary | ICD-10-CM | POA: Insufficient documentation

## 2021-09-25 DIAGNOSIS — C50812 Malignant neoplasm of overlapping sites of left female breast: Secondary | ICD-10-CM | POA: Diagnosis not present

## 2021-09-25 LAB — SURGICAL PATHOLOGY

## 2021-09-25 NOTE — Progress Notes (Signed)
[place] ?CONSULT NOTE ? ?Patient Care Team: ?Pleas Koch, NP as PCP - General (Nurse Practitioner) ? ?CHIEF COMPLAINTS/PURPOSE OF CONSULTATION:  ?Newly diagnosed breast cancer ? ?HISTORY OF PRESENTING ILLNESS:  ?Nichole [name] 46 y.o. female is here because of recent diagnosis of left breast IDC ?Patient states she felt some abnormality in her left breast in December, she does not think this has grown.  She also has a family history of breast cancer in mom who had it in 61s. ? ? ?SUMMARY OF ONCOLOGIC HISTORY: ?Oncology History  ?Malignant neoplasm of overlapping sites of left breast in female, estrogen receptor positive ([place])  ?07/08/2021 Mammogram  ?  ?Screening mammogram 07/08/2021 showed possible mass with distortion in the left breast. ?07/21/2021 she had a left diagnostic mammogram which showed a suspicious left breast mass at 12 o'clock position suspicious satellite nodule left breast at 11 o'clock position ?Ultrasound same day showed a 16 x 20 x 12 mm irregular hypoechoic mass left breast at 12 o'clock position 4 cm from the nipple.  There is an adjacent satellite nodule within the left breast 11 o'clock position 3 cm from nipple measuring 4 x 3 x 6 mm.  No enlarged left axillary lymphadenopathy ? ?  ?07/30/2021 Pathology Results  ? Pathology from left breast mass 11:00, 3 cm from nipple showed invasive mammary carcinoma grade 2 prognostics ER 50% moderate staining, PR 95% strong staining, Ki-67 less than 5% and HER2 negative.  The second mass at 11:00 also showed similar prognostics and is grade 2. ?  ?08/11/2021 Initial Diagnosis  ? Malignant neoplasm of overlapping sites of left breast in female, estrogen receptor positive ([place]) ?  ?08/29/2021 Genetic Testing  ? Negative hereditary cancer genetic testing: no pathogenic variants detected in [facility] CancerNext-Expanded +RNAinsight Panel.  Report date is 08/29/2021.  ? ?The CancerNext-Expanded gene panel offered by [facility] and includes  sequencing, rearrangement, and RNA analysis for the following 77 genes: AIP, ALK, APC, ATM, AXIN2, BAP1, BARD1, BLM, BMPR1A, BRCA1, BRCA2, BRIP1, CDC73, CDH1, CDK4, CDKN1B, CDKN2A, CHEK2, CTNNA1, DICER1, FANCC, FH, FLCN, GALNT12, KIF1B, LZTR1, MAX, MEN1, MET, MLH1, MSH2, MSH3, MSH6, MUTYH, NBN, NF1, NF2, NTHL1, PALB2, PHOX2B, PMS2, POT1, PRKAR1A, PTCH1, PTEN, RAD51C, RAD51D, RB1, RECQL, RET, SDHA, SDHAF2, SDHB, SDHC, SDHD, SMAD4, SMARCA4, SMARCB1, SMARCE1, STK11, SUFU, TMEM127, TP53, TSC1, TSC2, VHL and XRCC2 (sequencing and deletion/duplication); EGFR, EGLN1, HOXB13, KIT, MITF, PDGFRA, POLD1, and POLE (sequencing only); EPCAM and GREM1 (deletion/duplication only).  ?  ?09/11/2021 Surgery  ?  ?  ?09/11/2021 Surgery  ?  ?  ? ?Age at first child birth : 64 ? ?Ms. Freeze is here for a follow-up after surgery.  She had reexcision yesterday and doing well.  Rest of the pertinent 10 point ROS reviewed and negative. ? ?MEDICAL HISTORY:  ?Past Medical History:  ?Diagnosis Date  ? Asthma   ? exercise induced asthma  ? Cancer ([place]) 07/29/2021  ? left breast  ? Family history of breast cancer 08/14/2021  ? Family history of kidney cancer 08/14/2021  ? Pre-diabetes   ? ? ?SURGICAL HISTORY: ?Past Surgical History:  ?Procedure Laterality Date  ? BIOPSY  03/04/2021  ? Procedure: BIOPSY;  Surgeon: Felicie Morn, MD;  Location: [facility];  Service: General;;  ? BREAST BIOPSY Left 07/30/2021  ? BREAST BIOPSY Right 09/01/2021  ? BREAST BIOPSY Left 09/05/2021  ? BREAST LUMPECTOMY WITH RADIOACTIVE SEED AND SENTINEL LYMPH NODE BIOPSY Left 09/11/2021  ? Procedure: LEFT BREAST LUMPECTOMY WITH RADIOACTIVE SEED  X3 AND LEFT SENTINEL LYMPH NODE BIOPSY;  Surgeon: Erroll Luna, MD;  Location: [facility];  Service: General;  Laterality: Left;  ? ESOPHAGOGASTRODUODENOSCOPY N/A 03/04/2021  ? Procedure: ESOPHAGOGASTRODUODENOSCOPY (EGD);  Surgeon: Felicie Morn, MD;  Location: [facility];  Service: General;  Laterality: N/A;  ?  LAPAROSCOPIC GASTRIC SLEEVE RESECTION N/A 05/02/2021  ? Procedure: LAPAROSCOPIC GASTRIC SLEEVE RESECTION;  Surgeon: Felicie Morn, MD;  Location: [facility];  Service: General;  Laterality: N/A;  ? LAPAROSCOPIC VAGINAL HYSTERECTOMY WITH SALPINGECTOMY N/A 10/08/2016  ? Procedure: LAPAROSCOPIC ASSISTED VAGINAL HYSTERECTOMY WITH RIGHT SALPINGECTOMY LYSIS OF ADHESIONS;  Surgeon: Donnamae Jude, MD;  Location: [facility];  Service: Gynecology;  Laterality: N/A;  ? MYOMECTOMY  2014  ? RE-EXCISION OF BREAST LUMPECTOMY Left 09/24/2021  ? Procedure: RE-EXCISION OF LEFT BREAST LUMPECTOMY;  Surgeon: Erroll Luna, MD;  Location: [facility];  Service: General;  Laterality: Left;  ? Right Oophrectomy    ? UPPER GI ENDOSCOPY N/A 05/02/2021  ? Procedure: UPPER GI ENDOSCOPY;  Surgeon: Felicie Morn, MD;  Location: [facility];  Service: General;  Laterality: N/A;  ? WISDOM TOOTH EXTRACTION    ? ? ?SOCIAL HISTORY: ?Social History  ? ?Socioeconomic History  ? Marital status: Divorced  ?  Spouse name: Not on file  ? Number of children: 1  ? Years of education: Not on file  ? Highest education level: Not on file  ?Occupational History  ? Not on file  ?Tobacco Use  ? Smoking status: Former  ?  Packs/day: 0.10  ?  Years: 15.00  ?  Pack years: 1.50  ?  Types: Cigarettes  ?  Quit date: 01/23/2017  ?  Years since quitting: 4.6  ? Smokeless tobacco: Never  ?Vaping Use  ? Vaping Use: Never used  ?Substance and Sexual Activity  ? Alcohol use: Not Currently  ?  Comment: none in last year per pt  ? Drug use: Not Currently  ? Sexual activity: Not Currently  ?  Birth control/protection: None  ?Other Topics Concern  ? Not on file  ?Social History Narrative  ? Work as a Marine scientist at [employer].  ? 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  ? Has 32 year old son.  ? Moved from [place] in Sept. 2015  ? ?Social Determinants of Health  ? ?Financial Resource Strain: Not on file  ?Food Insecurity: Not on file  ?Transportation Needs: Not on  file  ?Physical Activity: Not on file  ?Stress: Not on file  ?Social Connections: Not on file  ?Intimate Partner Violence: Not on file  ? ? ?FAMILY HISTORY: ?Family History  ?Problem Relation Age of Onset  ? Arthritis Mother   ? Breast cancer Mother 88  ?     Lumpectomy  ? Hyperlipidemia Mother   ? Hypertension Mother   ? Kidney cancer Mother 67  ?     Had ablation  ? Graves' disease Mother   ?     Had thyroid removed  ? Graves' disease Brother   ?     Deceased  ? ? ?ALLERGIES:  has No Known Allergies. ? ?MEDICATIONS:  ?Current Outpatient Medications  ?Medication Sig Dispense Refill  ? albuterol (VENTOLIN HFA) 108 (90 Base) MCG/ACT inhaler Inhale 1-2 puffs into the lungs every 6 (six) hours as needed for wheezing or shortness of breath. 1 each 0  ? CALCIUM PO Take 1 tablet by mouth 3 (three) times daily.    ? cetirizine (ZYRTEC) 10 MG  tablet Take 1 tablet (10 mg total) by mouth at bedtime. For allergies 90 tablet 0  ? fluticasone (FLONASE) 50 MCG/ACT nasal spray Place 1 spray into both nostrils 2 (two) times daily as needed for allergies or rhinitis. 48 mL 0  ? ibuprofen (ADVIL) 800 MG tablet Take 1 tablet (800 mg total) by mouth every 8 (eight) hours as needed. 30 tablet 0  ? linaclotide (LINZESS) 72 MCG capsule Take 72 mcg by mouth daily as needed (constipation).    ? Multiple Vitamin (MULTI-VITAMIN) tablet Take 1 tablet by mouth daily.    ? oxyCODONE (OXY IR/ROXICODONE) 5 MG immediate release tablet Take 1 tablet (5 mg total) by mouth every 6 (six) hours as needed for severe pain. 15 tablet 0  ? oxyCODONE (OXY IR/ROXICODONE) 5 MG immediate release tablet Take 1 tablet (5 mg total) by mouth every 6 (six) hours as needed for severe pain. 15 tablet 0  ? ?No current facility-administered medications for this visit.  ? ?REVIEW OF SYSTEMS:   ?Constitutional: Denies fevers, chills or abnormal night sweats ?Eyes: Denies blurriness of vision, double vision or watery eyes ?Ears, nose, mouth, throat, and face: Denies  mucositis or sore throat ?Respiratory: Denies cough, dyspnea or wheezes ?Cardiovascular: Denies palpitation, chest discomfort or lower extremity swelling ?Gastrointestinal:  Denies nausea, heartburn or change in bowel

## 2021-09-25 NOTE — Assessment & Plan Note (Signed)
This is a very pleasant 46 year old female patient, premenopausal with family history of breast cancer in mom who noticed breast abnormality in her left breast in December point for further investigation.  She had a mammogram, diagnostic mammogram as well as ultrasound which noticed an abnormality at the 12 o'clock position in the left breast measuring around 20 mm in its largest dimension along with a satellite nodule in the same breast at around 11 o'clock position measuring 4 x 3 x 6 mm without any enlarged left axillary lymphadenopathy.  Biopsy from these masses showed invasive mammary carcinoma, grade 2, prognostics ER 50% moderate staining, PR 95% strong staining, KI less than 5% HER2 negative on the larger mass and similar prognostics on the second mass.   ?Given the intermediate grade, strong ER/PR positivity, low proliferation index and HER2 negative tumor, we discussed about upfront surgery followed by consideration for Oncotype testing. ?However on the final pathology, it was thought that all her tumor was indeed 1 large mass measuring at least 5 cm, 2 out of 4 sentinel lymph nodes involved with tumor.  Final pathologic staging was [id number].  Prognostics not repeated on the final sample.  Today we have discussed about considering adjuvant chemotherapy given the large tumor and positive lymph nodes.  She tells me that she is really does not want to lose hair and this is a big hard no know for her but she would like to think about it.  We have discussed that although we could consider molecular testing, these tests have not been validated in large tumors like hers.  We have discussed that CMF is regimen which does not cause typical hair loss but is definitely inferior to ACT.  She just had excision yesterday.  She will return to clinic next week and will discuss about her decision. ? ?She understands that ovarian suppression with antiestrogen therapy will be a key component for her treatment. ?

## 2021-09-26 ENCOUNTER — Telehealth: Payer: Self-pay | Admitting: Hematology and Oncology

## 2021-09-26 NOTE — Telephone Encounter (Signed)
Scheduled appointment per 05/04 los. Patient aware.  ?

## 2021-09-29 ENCOUNTER — Encounter: Payer: Self-pay | Admitting: Surgery

## 2021-09-29 LAB — SURGICAL PATHOLOGY

## 2021-09-30 ENCOUNTER — Encounter: Payer: BC Managed Care – PPO | Attending: Surgery | Admitting: Skilled Nursing Facility1

## 2021-09-30 ENCOUNTER — Encounter: Payer: Self-pay | Admitting: Skilled Nursing Facility1

## 2021-09-30 ENCOUNTER — Encounter: Payer: BC Managed Care – PPO | Admitting: Skilled Nursing Facility1

## 2021-09-30 DIAGNOSIS — E669 Obesity, unspecified: Secondary | ICD-10-CM | POA: Insufficient documentation

## 2021-09-30 NOTE — Progress Notes (Signed)
Bariatric Nutrition Follow-Up Visit ?Medical Nutrition Therapy  ? ? ?NUTRITION ASSESSMENT ?  ?Surgery date: 05/02/2022 ?Surgery type: sleeve ?Start weight at [facility]: 262 ?Weight today: 214.3 pounds ? ?Body Composition Scale 05/20/2021 07/02/2021 09/30/2021  ?Current Body Weight 236.6 230 214.3  ?Total Body Fat % 43 42 40.1  ?Visceral Fat '13 13 11  '$ ?Fat-Free Mass % 56.9 57.9 59.8  ? Total Body Water % 42.9 43.4 44.4  ?Muscle-Mass lbs 32.7 32.9 32.7  ?BMI 39 37.5 34.9  ?Body Fat Displacement     ?       Torso  lbs 63.1 59.9 53.2  ?       Left Leg  lbs 12.6 11.9 10.6  ?       Right Leg  lbs 12.6 11.9 10.6  ?       Left Arm  lbs 6.3 5.9 5.3  ?       Right Arm   lbs 6.3 5.9 5.3  ? ?Clinical  ?Medical hx: prediabetes, Asthma  ?Medications: see list  ?Labs: A1C 6.0 ?Notable signs/symptoms: some back pain ?Any previous deficiencies? No ?  ?Lifestyle & Dietary Hx ? ? ?Pt states she has breast cancer.  Pt states she trying to decide if she will use chemotherapy.  ?Dietitian recommended plant based proteins for overall health. ? ?Estimated daily fluid intake: 64 oz ?Estimated daily protein intake: 60 g ?Supplements: multi and calcium  ?Current average weekly physical activity: treadmill with inclines and speed intervals,  upper body weights  ? ?24-Hr Dietary Recall ?First Meal: 2 eggs + cheese stick + [place] bacon or sausage ?Snack: 8-10 macadamia ?Second Meal: chicken or beans ?Snack:   ?Third Meal: [place] ?Snack:  ?Beverages: 12 ounce coffee + splenda + flavored creamer, water, diet green tea ? ?Post-Op Goals/ Signs/ Symptoms ?Using straws: no ?Drinking while eating: no ?Chewing/swallowing difficulties: no ?Changes in vision: no ?Changes to mood/headaches: no ?Hair loss/changes to skin/nails: no ?Difficulty focusing/concentrating: no ?Sweating: no ?Limb weakness: no ?Dizziness/lightheadedness: no ?Palpitations: no  ?Carbonated/caffeinated beverages: no ?N/V/D/C/Gas: yes ?Abdominal pain: no ?Dumping syndrome: no ? ?  ?NUTRITION  DIAGNOSIS  ?Overweight/obesity ([place]-3.3) related to past poor dietary habits and physical inactivity as evidenced by completed bariatric surgery and following dietary guidelines for continued weight loss and healthy nutrition status. ?  ?  ?NUTRITION INTERVENTION ?Nutrition counseling (C-1) and education (E-2) to facilitate bariatric surgery goals, including: ? ?Goals: ?-try plant based protein options ?-focus on your emotional health and well being  ? ? ?Handouts Provided Include  ?Tofu recipe ?Plant based protein options ?Plant based meal ideas: 21 days of meals  ? ?Learning Style & Readiness for Change ?Teaching method utilized: Visual & Auditory  ?Demonstrated degree of understanding via: Teach Back  ?Readiness Level: action ?Barriers to learning/adherence to lifestyle change: none identified  ? ?RD's Notes for Next Visit ?Assess adherence to pt chosen goals  ? ? ?MONITORING & EVALUATION ?Dietary intake, weekly physical activity, body weight ? ?Next Steps ?Patient is to follow-up in 6 weeks ?

## 2021-10-01 ENCOUNTER — Ambulatory Visit: Payer: BC Managed Care – PPO | Attending: Hematology and Oncology | Admitting: Rehabilitation

## 2021-10-01 ENCOUNTER — Encounter: Payer: Self-pay | Admitting: Rehabilitation

## 2021-10-01 DIAGNOSIS — Z483 Aftercare following surgery for neoplasm: Secondary | ICD-10-CM | POA: Diagnosis not present

## 2021-10-01 DIAGNOSIS — C50812 Malignant neoplasm of overlapping sites of left female breast: Secondary | ICD-10-CM | POA: Insufficient documentation

## 2021-10-01 DIAGNOSIS — Z170 Estrogen receptor positive status [ER+]: Secondary | ICD-10-CM | POA: Insufficient documentation

## 2021-10-01 NOTE — Patient Instructions (Addendum)
[facility] ? [address], [address] ? [place] [address] ? [phone] ? ?After Breast Cancer Class ?It is recommended you attend the ABC class to be educated on lymphedema risk reduction. This class is free of charge and lasts for 1 hour. It is a 1-time class. You will need to download the Webex app either on your phone or computer. We will send you a link the night before or the morning of the class. You should be able to click on that link to join the class. This is not a confidential class. You don't have to turn your camera on, but other participants may be able to see your email address. ? ?Scar massage ?You can begin gentle scar massage to your incision sites. Gently place one hand on the incision and move the skin (without sliding on the skin) in various directions. Do this for a few minutes and then you can gently massage either coconut oil or vitamin E cream into the scars. ? ?Compression garment ?You should continue wearing your compression bra until you feel like you no longer have swelling. ? ?Home exercise Program ?Continue doing the exercises you were given until you feel like you can do them without feeling any tightness at the end.  ? ?Walking Program ?Studies show that 30 minutes of walking per day (fast enough to elevate your heart rate) can significantly reduce the risk of a cancer recurrence. If you can't walk due to other medical reasons, we encourage you to find another activity you could do (like a stationary bike or water exercise). ? ?Posture ?After breast cancer surgery, people frequently sit with rounded shoulders posture because it puts their incisions on slack and feels better. If you sit like this and scar tissue forms in that position, you can become very tight and have pain sitting or standing with good posture. Try to be aware of your posture and sit and stand up tall to heal properly. ? ?Follow up PT: ?It is recommended you return every 3 months for the  first 3 years following surgery to be assessed on the SOZO machine for an L-Dex score. This helps prevent clinically significant lymphedema in 95% of patients. These follow up screens are 10 minute appointments that you are not billed for.  ? ?

## 2021-10-01 NOTE — Therapy (Signed)
?OUTPATIENT PHYSICAL THERAPY BREAST CANCER POST OP FOLLOW UP ? ? ?Patient Name: Nichole [name] ?MRN: [id number] ?DOB:1975-09-14, 46 y.o., female ?Today's Date: 10/01/2021 ? ? PT End of Session - 10/01/21 0849   ? ? Visit Number 2   ? Number of Visits 2   ? Date for PT Re-Evaluation 10/22/21   ? PT Start Time 0820   ? PT Stop Time 0845   ? PT Time Calculation (min) 25 min   ? Activity Tolerance Patient tolerated treatment well   ? Behavior During Therapy [facility] for tasks assessed/performed   ? ?  ?  ? ?  ? ? ?Past Medical History:  ?Diagnosis Date  ? Asthma   ? exercise induced asthma  ? Cancer ([place]) 07/29/2021  ? left breast  ? Family history of breast cancer 08/14/2021  ? Family history of kidney cancer 08/14/2021  ? Pre-diabetes   ? ?Past Surgical History:  ?Procedure Laterality Date  ? BIOPSY  03/04/2021  ? Procedure: BIOPSY;  Surgeon: Felicie Morn, MD;  [facility];  Service: General;;  ? BREAST BIOPSY Left 07/30/2021  ? BREAST BIOPSY Right 09/01/2021  ? BREAST BIOPSY Left 09/05/2021  ? BREAST LUMPECTOMY WITH RADIOACTIVE SEED AND SENTINEL LYMPH NODE BIOPSY Left 09/11/2021  ? Procedure: LEFT BREAST LUMPECTOMY WITH RADIOACTIVE SEED X3 AND LEFT SENTINEL LYMPH NODE BIOPSY;  Surgeon: Erroll Luna, MD;  Location: [place];  Service: General;  Laterality: Left;  ? ESOPHAGOGASTRODUODENOSCOPY N/A 03/04/2021  ? Procedure: ESOPHAGOGASTRODUODENOSCOPY (EGD);  Surgeon: Felicie Morn, MD;  Location: [facility];  Service: General;  Laterality: N/A;  ? LAPAROSCOPIC GASTRIC SLEEVE RESECTION N/A 05/02/2021  ? Procedure: LAPAROSCOPIC GASTRIC SLEEVE RESECTION;  Surgeon: Felicie Morn, MD;  [facility];  Service: General;  Laterality: N/A;  ? LAPAROSCOPIC VAGINAL HYSTERECTOMY WITH SALPINGECTOMY N/A 10/08/2016  ? Procedure: LAPAROSCOPIC ASSISTED VAGINAL HYSTERECTOMY WITH RIGHT SALPINGECTOMY LYSIS OF ADHESIONS;  Surgeon: Donnamae Jude, MD;  Location: [facility];  Service: Gynecology;  Laterality: N/A;   ? MYOMECTOMY  2014  ? RE-EXCISION OF BREAST LUMPECTOMY Left 09/24/2021  ? Procedure: RE-EXCISION OF LEFT BREAST LUMPECTOMY;  Surgeon: Erroll Luna, MD;  Location: [place];  Service: General;  Laterality: Left;  ? Right Oophrectomy    ? UPPER GI ENDOSCOPY N/A 05/02/2021  ? Procedure: UPPER GI ENDOSCOPY;  Surgeon: Felicie Morn, MD;  [facility];  Service: General;  Laterality: N/A;  ? WISDOM TOOTH EXTRACTION    ? ?Patient Active Problem List  ? Diagnosis Date Noted  ? Genetic testing 08/29/2021  ? Family history of breast cancer 08/14/2021  ? Family history of kidney cancer 08/14/2021  ? Malignant neoplasm of overlapping sites of left breast in female, estrogen receptor positive ([place]) 08/11/2021  ? Moderate persistent asthma with exacerbation 02/28/2021  ? Bilateral lower extremity edema 11/21/2020  ? Chronic right-sided low back pain with right-sided sciatica 08/01/2020  ? Preventative health care 04/11/2018  ? Environmental and seasonal allergies 04/11/2018  ? Pain of left thumb 04/11/2018  ? Hyperlipidemia 04/11/2018  ? Lumbar radiculopathy 08/16/2014  ? Prediabetes 08/02/2014  ? Morbid obesity with BMI of 40.0-44.9, adult ([place]) 08/02/2014  ? Tobacco abuse 08/02/2014  ? ? ?PCP: Alma Friendly, NP ? ?REFERRING PROVIDER: Dr. Chryl Heck  ? ?REFERRING DIAG: Lt breast cancer  ? ?THERAPY DIAG:  ?Aftercare following surgery for neoplasm ? ?Malignant neoplasm of overlapping sites of left breast in female, estrogen receptor positive ([place]) ? ?ONSET DATE: 08/23/21 ? ?SUBJECTIVE:                                                                                                                                                                                          ? ?  SUBJECTIVE STATEMENT: ?A little tender in the armpit but I feel back to normal.  I have the compression bra. And the binder.  ? ?PERTINENT HISTORY:  ?left lumpectomy and SLNB on 09/11/21 with Dr. Toth with 2/4 LN positive.  Had re-excision  09/24/21.  Is recommended to have chemo but does not want to. Will have radiation for sure.   ? ?PATIENT GOALS:  Reassess how my recovery is going related to arm function, pain, and swelling. ? ?PAIN:  ?Are you having pain? No ? ?PRECAUTIONS: Recent Surgery, left UE Lymphedema risk ? ?ACTIVITY LEVEL / LEISURE: back normal activity  ? ? ?OBJECTIVE:  ? ?PATIENT SURVEYS:  ?QUICK DASH: ?6.82% ? ?OBSERVATIONS: ? Healing well ? ?POSTURE:  ?WNL ? ?LYMPHEDEMA ASSESSMENT:  ? ?UPPER EXTREMITY AROM/PROM: ?  ?A/PROM RIGHT  08/27/2021 ?  10/01/21  ?Shoulder extension     ?Shoulder flexion 175   ?Shoulder abduction 165   ?Shoulder internal rotation 100   ?Shoulder external rotation     ?                        (Blank rows = not tested) ?  ?A/PROM LEFT  08/27/2021 10/01/21  ?Shoulder extension     ?Shoulder flexion 175 175  ?Shoulder abduction 165 165  ?Shoulder internal rotation    ?Shoulder external rotation  100 100  ?                        (Blank rows = not tested) ?  ?  ?  ?LYMPHEDEMA ASSESSMENTS:  ?  ?LANDMARK RIGHT  08/27/2021  ?10 cm proximal to olecranon process 35  ?Olecranon process 28.5  ?10 cm proximal to ulnar styloid process 24.3  ?Just proximal to ulnar styloid process 18  ?Across hand at thumb web space 20.4  ?At base of 2nd digit 7.4  ?(Blank rows = not tested) ?  ?LANDMARK LEFT  08/27/2021 10/01/21 (pt has lost around 8 lbs)  ?10 cm proximal to olecranon process 35 33.5  ?Olecranon process 28.5 27.5  ?10 cm proximal to ulnar styloid process 24.3 22.3  ?Just proximal to ulnar styloid process 18 18  ?Across hand at thumb web space 20.4 20.4  ?At base of 2nd digit 7.0 7.0  ?(Blank rows = not tested) ? ? ?PATIENT EDUCATION:  ?Education details: lymphedema risk reduction as pt is unable to do ABC class, instruction section per below ?Person educated: Patient ?Education method: Explanation and Handouts ?Education comprehension: verbalized understanding ? ? ?HOME EXERCISE PROGRAM: ? Reviewed previously given post op HEP, discussed  exercising and stretching throughout and after radiation ? ?ASSESSMENT: ? ?CLINICAL IMPRESSION: ?Even though pt is 1 week post re-excision she has returned to normal activities including pressure washing, etc and has returned to full ROM.  Pt is deciding if she will have chemotherapy but will have radiation for sure.  No further treatment is needed but we will continue SOZO screens for lymphedema.   ? ?Pt will benefit from skilled therapeutic intervention to improve on the following deficits: Decreased knowledge of precautions, impaired UE functional use, pain, decreased ROM, postural dysfunction.  ? ?PT treatment/interventions: ADL/Self care home management, Patient/Family education ? ? ? ? ?GOALS: ?Goals reviewed with patient? Yes ? ?LONG TERM GOALS:  (STG=LTG) ? ?GOALS Name Target Date ?(Remove Blue Hyperlink) Goal status  ?1 Pt will demonstrate she has regained full shoulder ROM and function post operatively compared to baselines.  ?Baseline:   10/01/21 MET  ?     ?     ?     ? ? ? ?PLAN: ?PT FREQUENCY/DURATION: SOZO every 3 months until at least 09/25/23 ? ?PLAN FOR NEXT SESSION: SOZO ? ? ?[facility] ? [address], [address] ? [place] [address] ? [phone] ? ?After Breast Cancer Class ?It is recommended you attend the ABC class to be educated on lymphedema risk reduction. This class is free of charge and lasts for 1 hour. It is a 1-time class. You will need to download the Webex app either on your phone or computer. We will send you a link the night before or the morning of the class. You should be able to click on that link to join the class. This is not a confidential class. You don't have to turn your camera on, but other participants may be able to see your email address. ? ?Scar massage ?You can begin gentle scar massage to you incision sites. Gently place one hand on the incision and move the skin (without sliding on the skin) in various directions. Do this for a few minutes and  then you can gently massage either coconut oil or vitamin E cream into the scars. ? ?Compression garment ?You should continue wearing your compression bra until you feel like you no longer have swelling

## 2021-10-02 ENCOUNTER — Inpatient Hospital Stay (HOSPITAL_BASED_OUTPATIENT_CLINIC_OR_DEPARTMENT_OTHER): Payer: BC Managed Care – PPO | Admitting: Hematology and Oncology

## 2021-10-02 ENCOUNTER — Other Ambulatory Visit: Payer: Self-pay

## 2021-10-02 ENCOUNTER — Encounter: Payer: Self-pay | Admitting: *Deleted

## 2021-10-02 ENCOUNTER — Telehealth: Payer: Self-pay | Admitting: *Deleted

## 2021-10-02 ENCOUNTER — Encounter: Payer: Self-pay | Admitting: Hematology and Oncology

## 2021-10-02 DIAGNOSIS — Z170 Estrogen receptor positive status [ER+]: Secondary | ICD-10-CM | POA: Diagnosis not present

## 2021-10-02 DIAGNOSIS — C50812 Malignant neoplasm of overlapping sites of left female breast: Secondary | ICD-10-CM

## 2021-10-02 DIAGNOSIS — Z803 Family history of malignant neoplasm of breast: Secondary | ICD-10-CM | POA: Diagnosis not present

## 2021-10-02 DIAGNOSIS — Z87891 Personal history of nicotine dependence: Secondary | ICD-10-CM | POA: Diagnosis not present

## 2021-10-02 DIAGNOSIS — Z8051 Family history of malignant neoplasm of kidney: Secondary | ICD-10-CM | POA: Diagnosis not present

## 2021-10-02 NOTE — Assessment & Plan Note (Signed)
This is a very pleasant 46 year old female patient, premenopausal with family history of breast cancer in mom who noticed breast abnormality in her left breast in December point for further investigation.  She had a mammogram, diagnostic mammogram as well as ultrasound which noticed an abnormality at the 12 o'clock position in the left breast measuring around 20 mm in its largest dimension along with a satellite nodule in the same breast at around 11 o'clock position measuring 4 x 3 x 6 mm without any enlarged left axillary lymphadenopathy.  Biopsy from these masses showed invasive mammary carcinoma, grade 2, prognostics ER 50% moderate staining, PR 95% strong staining, KI less than 5% HER2 negative on the larger mass and similar prognostics on the second mass.   ? ?Given the intermediate grade, strong ER/PR positivity, low proliferation index and HER2 negative tumor, we discussed about upfront surgery followed by consideration for Oncotype testing. ?However on the final pathology, it was thought that all her tumor was indeed 1 large mass measuring at least 5 cm, 2 out of 4 sentinel lymph nodes involved with tumor.  Final pathologic staging was [id number].  Prognostics not repeated on the final sample.  Reexcision showed tumor measuring 2 mm. ? ?We have once again discussed today about chemotherapy being the standard adjuvant recommendation here given the tumor about 5 cm or larger as well as at least 2 positive lymph nodes.  She is again very reluctant to proceed with chemotherapy.  We have discussed about considering Oncotype testing although it is not quite standard to test in tumors greater than 5 cm.  If Oncotype score is very low, we can consider omitting chemotherapy since and that particular subset most likely benefit is from addition of ovarian suppression. ? ?However if the Oncotype score is intermediate to high, I would still recommend adjuvant chemotherapy followed by ovarian suppression with aromatase  inhibitors for antiestrogen therapy for 5 to 10 years. ? ?She is very reluctant to proceed with chemotherapy.  She understands that the benefit of chemotherapy is to reduce the risk of recurrence including distant metastatic disease.  She will return to clinic in about 3 weeks to review Oncotype results.  She will proceed with adjuvant radiation if she decides to omit chemotherapy. ?

## 2021-10-02 NOTE — Telephone Encounter (Signed)
Received order for oncotype testing. Requisition faxed to pathology and [facility] °

## 2021-10-02 NOTE — Progress Notes (Signed)
[place] ?CONSULT NOTE ? ?Patient Care Team: ?Pleas Koch, NP as PCP - General (Nurse Practitioner) ? ?CHIEF COMPLAINTS/PURPOSE OF CONSULTATION:  ?Newly diagnosed breast cancer ? ?HISTORY OF PRESENTING ILLNESS:  ? ?Nichole [name] 46 y.o. female is here because of recent diagnosis of left breast IDC ?Patient states she felt some abnormality in her left breast in December, she does not think this has grown.  She also has a family history of breast cancer in mom who had it in 46s. ? ?SUMMARY OF ONCOLOGIC HISTORY: ?Oncology History  ?Malignant neoplasm of overlapping sites of left breast in female, estrogen receptor positive ([place])  ?07/08/2021 Mammogram  ?  ?Screening mammogram 07/08/2021 showed possible mass with distortion in the left breast. ?07/21/2021 she had a left diagnostic mammogram which showed a suspicious left breast mass at 12 o'clock position suspicious satellite nodule left breast at 11 o'clock position ?Ultrasound same day showed a 16 x 20 x 12 mm irregular hypoechoic mass left breast at 12 o'clock position 4 cm from the nipple.  There is an adjacent satellite nodule within the left breast 11 o'clock position 3 cm from nipple measuring 4 x 3 x 6 mm.  No enlarged left axillary lymphadenopathy ? ?  ?07/30/2021 Pathology Results  ? Pathology from left breast mass 11:00, 3 cm from nipple showed invasive mammary carcinoma grade 2 prognostics ER 50% moderate staining, PR 95% strong staining, Ki-67 less than 5% and HER2 negative.  The second mass at 11:00 also showed similar prognostics and is grade 2. ?  ?08/11/2021 Initial Diagnosis  ? Malignant neoplasm of overlapping sites of left breast in female, estrogen receptor positive ([place]) ?  ?08/11/2021 Cancer Staging  ? Staging form: Breast, AJCC 8th Edition ?- Pathologic: Stage IB (pT2, pN1, cM0, G2, ER+, PR+, HER2-) - Signed by Benay Pike, MD on 10/02/2021 ?Histologic grading system: 3 grade system ? ?  ?08/29/2021 Genetic Testing  ?  Negative hereditary cancer genetic testing: no pathogenic variants detected in [facility] CancerNext-Expanded +RNAinsight Panel.  Report date is 08/29/2021.  ? ?The CancerNext-Expanded gene panel offered by [facility] and includes sequencing, rearrangement, and RNA analysis for the following 77 genes: AIP, ALK, APC, ATM, AXIN2, BAP1, BARD1, BLM, BMPR1A, BRCA1, BRCA2, BRIP1, CDC73, CDH1, CDK4, CDKN1B, CDKN2A, CHEK2, CTNNA1, DICER1, FANCC, FH, FLCN, GALNT12, KIF1B, LZTR1, MAX, MEN1, MET, MLH1, MSH2, MSH3, MSH6, MUTYH, NBN, NF1, NF2, NTHL1, PALB2, PHOX2B, PMS2, POT1, PRKAR1A, PTCH1, PTEN, RAD51C, RAD51D, RB1, RECQL, RET, SDHA, SDHAF2, SDHB, SDHC, SDHD, SMAD4, SMARCA4, SMARCB1, SMARCE1, STK11, SUFU, TMEM127, TP53, TSC1, TSC2, VHL and XRCC2 (sequencing and deletion/duplication); EGFR, EGLN1, HOXB13, KIT, MITF, PDGFRA, POLD1, and POLE (sequencing only); EPCAM and GREM1 (deletion/duplication only).  ?  ?09/11/2021 Surgery  ?  ?  ?09/11/2021 Surgery  ?  ?  ? ?Age at first child birth : 53 ? ?Ms. Melnik is here for a follow-up after surgery.   ?She has been healing well from surgery. ? ?MEDICAL HISTORY:  ?Past Medical History:  ?Diagnosis Date  ? Asthma   ? exercise induced asthma  ? Cancer ([place]) 07/29/2021  ? left breast  ? Family history of breast cancer 08/14/2021  ? Family history of kidney cancer 08/14/2021  ? Pre-diabetes   ? ? ?SURGICAL HISTORY: ?Past Surgical History:  ?Procedure Laterality Date  ? BIOPSY  03/04/2021  ? Procedure: BIOPSY;  Surgeon: Felicie Morn, MD;  [facility];  Service: General;;  ? BREAST BIOPSY Left 07/30/2021  ? BREAST BIOPSY Right 09/01/2021  ?  BREAST BIOPSY Left 09/05/2021  ? BREAST LUMPECTOMY WITH RADIOACTIVE SEED AND SENTINEL LYMPH NODE BIOPSY Left 09/11/2021  ? Procedure: LEFT BREAST LUMPECTOMY WITH RADIOACTIVE SEED X3 AND LEFT SENTINEL LYMPH NODE BIOPSY;  Surgeon: Erroll Luna, MD;  Location: [facility];  Service: General;  Laterality: Left;  ? ESOPHAGOGASTRODUODENOSCOPY N/A  03/04/2021  ? Procedure: ESOPHAGOGASTRODUODENOSCOPY (EGD);  Surgeon: Felicie Morn, MD;  Location: [facility];  Service: General;  Laterality: N/A;  ? LAPAROSCOPIC GASTRIC SLEEVE RESECTION N/A 05/02/2021  ? Procedure: LAPAROSCOPIC GASTRIC SLEEVE RESECTION;  Surgeon: Felicie Morn, MD;  [facility];  Service: General;  Laterality: N/A;  ? LAPAROSCOPIC VAGINAL HYSTERECTOMY WITH SALPINGECTOMY N/A 10/08/2016  ? Procedure: LAPAROSCOPIC ASSISTED VAGINAL HYSTERECTOMY WITH RIGHT SALPINGECTOMY LYSIS OF ADHESIONS;  Surgeon: Donnamae Jude, MD;  Location: [facility];  Service: Gynecology;  Laterality: N/A;  ? MYOMECTOMY  2014  ? RE-EXCISION OF BREAST LUMPECTOMY Left 09/24/2021  ? Procedure: RE-EXCISION OF LEFT BREAST LUMPECTOMY;  Surgeon: Erroll Luna, MD;  Location: [place] [facility];  Service: General;  Laterality: Left;  ? Right Oophrectomy    ? UPPER GI ENDOSCOPY N/A 05/02/2021  ? Procedure: UPPER GI ENDOSCOPY;  Surgeon: Felicie Morn, MD;  [facility];  Service: General;  Laterality: N/A;  ? WISDOM TOOTH EXTRACTION    ? ? ?SOCIAL HISTORY: ?Social History  ? ?Socioeconomic History  ? Marital status: Divorced  ?  Spouse name: Not on file  ? Number of children: 1  ? Years of education: Not on file  ? Highest education level: Not on file  ?Occupational History  ? Not on file  ?Tobacco Use  ? Smoking status: Former  ?  Packs/day: 0.10  ?  Years: 15.00  ?  Pack years: 1.50  ?  Types: Cigarettes  ?  Quit date: 01/23/2017  ?  Years since quitting: 4.6  ? Smokeless tobacco: Never  ?Vaping Use  ? Vaping Use: Never used  ?Substance and Sexual Activity  ? Alcohol use: Not Currently  ?  Comment: none in last year per pt  ? Drug use: Not Currently  ? Sexual activity: Not Currently  ?  Birth control/protection: None  ?Other Topics Concern  ? Not on file  ?Social History Narrative  ? Work as a Marine scientist at [employer].  ? 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  ? Has 49 year old son.  ? Moved from [place] in Sept. 2015  ? ?Social Determinants of Health  ? ?Financial Resource Strain: Not on file  ?Food Insecurity: Not on file  ?Transportation Needs: Not on file  ?Physical Activity: Not on file  ?Stress: Not on file  ?Social Connections: Not on file  ?Intimate Partner Violence: Not on file  ? ? ?FAMILY HISTORY: ?Family History  ?Problem Relation Age of Onset  ? Arthritis Mother   ? Breast cancer Mother 53  ?     Lumpectomy  ? Hyperlipidemia Mother   ? Hypertension Mother   ? Kidney cancer Mother 12  ?     Had ablation  ? Graves' disease Mother   ?     Had thyroid removed  ? Graves' disease Brother   ?     Deceased  ? ? ?ALLERGIES:  has No Known Allergies. ? ?MEDICATIONS:  ?Current Outpatient Medications  ?Medication Sig Dispense Refill  ? albuterol (VENTOLIN HFA) 108 (90 Base) MCG/ACT inhaler Inhale 1-2 puffs into the lungs every 6 (six) hours as needed for wheezing or  shortness of breath. 1 each 0  ? CALCIUM PO Take 1 tablet by mouth 3 (three) times daily.    ? cetirizine (ZYRTEC) 10 MG tablet Take 1 tablet (10 mg total) by mouth at bedtime. For allergies 90 tablet 0  ? fluticasone (FLONASE) 50 MCG/ACT nasal spray Place 1 spray into both nostrils 2 (two) times daily as needed for allergies or rhinitis. 48 mL 0  ? ibuprofen (ADVIL) 800 MG tablet Take 1 tablet (800 mg total) by mouth every 8 (eight) hours as needed. 30 tablet 0  ? linaclotide (LINZESS) 72 MCG capsule Take 72 mcg by mouth daily as needed (constipation).    ? Multiple Vitamin (MULTI-VITAMIN) tablet Take 1 tablet by mouth daily.    ? oxyCODONE (OXY IR/ROXICODONE) 5 MG immediate release tablet Take 1 tablet (5 mg total) by mouth every 6 (six) hours as needed for severe pain. 15 tablet 0  ? oxyCODONE (OXY IR/ROXICODONE) 5 MG immediate release tablet Take 1 tablet (5 mg total) by mouth every 6 (six) hours as needed for severe pain. 15 tablet 0  ? ?No current facility-administered medications for this visit.  ? ?REVIEW OF SYSTEMS:   ?Constitutional:  Denies fevers, chills or abnormal night sweats ?Eyes: Denies blurriness of vision, double vision or watery eyes ?Ears, nose, mouth, throat, and face: Denies mucositis or sore throat ?Respiratory: Denies cough, dysp

## 2021-10-10 ENCOUNTER — Telehealth: Payer: Self-pay | Admitting: *Deleted

## 2021-10-10 DIAGNOSIS — C50812 Malignant neoplasm of overlapping sites of left female breast: Secondary | ICD-10-CM | POA: Diagnosis not present

## 2021-10-10 DIAGNOSIS — Z170 Estrogen receptor positive status [ER+]: Secondary | ICD-10-CM | POA: Diagnosis not present

## 2021-10-10 NOTE — Telephone Encounter (Signed)
Absence management form successfully faxed to [facility] [phone].  Original copy mailed to patient address on file. [place] [address]  No further form delivery actions performed, requested, or required of this form nurse.

## 2021-10-13 ENCOUNTER — Telehealth: Payer: Self-pay | Admitting: *Deleted

## 2021-10-13 ENCOUNTER — Encounter: Payer: Self-pay | Admitting: *Deleted

## 2021-10-13 DIAGNOSIS — C50812 Malignant neoplasm of overlapping sites of left female breast: Secondary | ICD-10-CM

## 2021-10-13 NOTE — Telephone Encounter (Signed)
Received oncotype results of 10/12%. Patient is aware. Referral placed for Dr.Moody

## 2021-10-15 ENCOUNTER — Encounter (HOSPITAL_COMMUNITY): Payer: Self-pay

## 2021-10-22 ENCOUNTER — Telehealth: Payer: Self-pay

## 2021-10-22 NOTE — Telephone Encounter (Signed)
Left voicemail reminder of patient's 8:00am-10/23/21 telephone appointment w/ Shona Simpson PA-C. I left my extension [phone] and requested that patient return my call, so that I may complete the nursing portion of patient's appointment.

## 2021-10-23 ENCOUNTER — Encounter: Payer: Self-pay | Admitting: Radiation Oncology

## 2021-10-23 ENCOUNTER — Ambulatory Visit
Admission: RE | Admit: 2021-10-23 | Discharge: 2021-10-23 | Disposition: A | Discharge status: Home / Self Care | Payer: BC Managed Care – PPO | Source: Ambulatory Visit | Attending: Radiation Oncology | Admitting: Radiation Oncology

## 2021-10-23 ENCOUNTER — Encounter: Payer: Self-pay | Admitting: *Deleted

## 2021-10-23 DIAGNOSIS — Z510 Encounter for antineoplastic radiation therapy: Secondary | ICD-10-CM | POA: Diagnosis not present

## 2021-10-23 DIAGNOSIS — Z170 Estrogen receptor positive status [ER+]: Secondary | ICD-10-CM

## 2021-10-23 DIAGNOSIS — C50812 Malignant neoplasm of overlapping sites of left female breast: Secondary | ICD-10-CM | POA: Diagnosis not present

## 2021-10-23 NOTE — Progress Notes (Signed)
Telephone appointment. I verified patient's identity and began nursing interview. Patient is doing well. No issues reported at this time.  Meaningful use complete.  Reminded patient of her 8:00am-10/23/21 telephone appointment w/ Shona Simpson PA-C. I left my extension [phone] in case patient needs anything. Patient verbalized understanding.  Patient contact [phone]

## 2021-10-23 NOTE — Progress Notes (Signed)
Radiation Oncology         ([phone]) [phone] ________________________________  Outpatient Follow Up - Conducted via telephone due to patient preference  I spoke with the patient to conduct this consult visit via telephone to spare the patient an appointment that would interfere with her work schedule.   Name: Nichole [name]        MRN: [id number]  Date of Service: 10/23/2021 DOB: Nov 29, 1975  PX:[id number], Leticia Penna, NP  Benay Pike, MD     REFERRING PHYSICIAN: Benay Pike, MD   DIAGNOSIS: The encounter diagnosis was Malignant neoplasm of overlapping sites of left breast in female, estrogen receptor positive ([place]).   HISTORY OF PRESENT ILLNESS: Nichole [name] is a 46 y.o. female seen for new diagnosis of left breast cancer.  The patient presented for screening mammogram and 2 abnormalities were found by diagnostic imaging in the left breast, at 12:00 a 2 cm lesion was noted and at 11:00 a 6 mm satellite lesion was noted.  The left axilla was negative for adenopathy and biopsies performed on 07/30/2021 showed similar findings in both specimens consistent with grade 2 invasive lobular carcinoma, each of the cancers was ER/PR positive, HER2 negative with Ki-67 both less than 5%.    Since her last visit, she did have MRI imaging on 08/18/2021 that showed 2 adjacent areas of biopsy-proven malignancy in the upper inner quadrant of the left breast.  The surrounding abnormal enhancement spans a total of 2.8 cm.  3 small indeterminate masses extending 3 cm posterior to the known cancer in the upper inner left breast was noted the largest measuring 4 mm, a 1 cm enhancing mass in the retroareolar right breast and 9 mm enhancing lesion in the upper inner right breast were also seen, MRI guided biopsy was recommended for the left and right breast.  On 09/01/2021 the right breast was biopsied in the retroareolar and upper inner quadrant and both showed fibrocystic change, the retroareolar area showed  suggested focus of healed fat necrosis but no malignancy was identified in the right breast.  The left biopsies were performed on 09/05/2021, invasive mammary carcinoma with lobular features grade 2 were noted that was ER/PR positive there was not enough cellular material for HER2 analysis and Ki-67 was less than 1%.  Overall it was felt that this was similar to the initial biopsy.  She was counseled on proceeding with surgery and underwent left lumpectomy with sentinel lymph node biopsy with Dr. Brantley Stage on 09/11/2021.  Final pathology showed a grade 2 invasive lobular carcinoma measuring greater than 5 cm and the tumor was diffusely infiltrative associated with a cavitary lesion hence the sizing estimate.  Her margins were 2.5 mm from the anterior surface and 5 mm from the inferior surface, 4 sampled lymph nodes were removed and to contained metastatic disease.  Reexcision of her margins was performed on 09/24/2021, focal residual invasive lobular carcinoma measuring 2 mm was seen but was 2 mm from the final lateral margin.Oncotype testing showed a low risk score of 10 however Dr. Chryl Heck is concerned that this is not representative of the significance of her disease and given the node positivity she has recommended systemic therapy.  Patient has declined chemotherapy.  She is contacted by phone to discuss adjuvant radiotherapy to the left breast and regional lymph nodes.  PREVIOUS RADIATION THERAPY: No   PAST MEDICAL HISTORY:  Past Medical History:  Diagnosis Date   Asthma    exercise induced asthma   Cancer ([place])  07/29/2021   left breast   Family history of breast cancer 08/14/2021   Family history of kidney cancer 08/14/2021   Pre-diabetes        PAST SURGICAL HISTORY: Past Surgical History:  Procedure Laterality Date   BIOPSY  03/04/2021   Procedure: BIOPSY;  Surgeon: Felicie Morn, MD;  [facility];  Service: General;;   BREAST BIOPSY Left 07/30/2021   BREAST BIOPSY Right  09/01/2021   BREAST BIOPSY Left 09/05/2021   BREAST LUMPECTOMY WITH RADIOACTIVE SEED AND SENTINEL LYMPH NODE BIOPSY Left 09/11/2021   Procedure: LEFT BREAST LUMPECTOMY WITH RADIOACTIVE SEED X3 AND LEFT SENTINEL LYMPH NODE BIOPSY;  Surgeon: Erroll Luna, MD;  Location: [facility];  Service: General;  Laterality: Left;   ESOPHAGOGASTRODUODENOSCOPY N/A 03/04/2021   Procedure: ESOPHAGOGASTRODUODENOSCOPY (EGD);  Surgeon: Felicie Morn, MD;  Location: [facility];  Service: General;  Laterality: N/A;   LAPAROSCOPIC GASTRIC SLEEVE RESECTION N/A 05/02/2021   Procedure: LAPAROSCOPIC GASTRIC SLEEVE RESECTION;  Surgeon: Felicie Morn, MD;  [facility];  Service: General;  Laterality: N/A;   LAPAROSCOPIC VAGINAL HYSTERECTOMY WITH SALPINGECTOMY N/A 10/08/2016   Procedure: LAPAROSCOPIC ASSISTED VAGINAL HYSTERECTOMY WITH RIGHT SALPINGECTOMY LYSIS OF ADHESIONS;  Surgeon: Donnamae Jude, MD;  Location: [facility];  Service: Gynecology;  Laterality: N/A;   MYOMECTOMY  2014   RE-EXCISION OF BREAST LUMPECTOMY Left 09/24/2021   Procedure: RE-EXCISION OF LEFT BREAST LUMPECTOMY;  Surgeon: Erroll Luna, MD;  Location: [facility];  Service: General;  Laterality: Left;   Right Oophrectomy     UPPER GI ENDOSCOPY N/A 05/02/2021   Procedure: UPPER GI ENDOSCOPY;  Surgeon: Felicie Morn, MD;  [facility];  Service: General;  Laterality: N/A;   WISDOM TOOTH EXTRACTION       FAMILY HISTORY:  Family History  Problem Relation Age of Onset   Arthritis Mother    Breast cancer Mother 102       Lumpectomy   Hyperlipidemia Mother    Hypertension Mother    Kidney cancer Mother 68       Had ablation   Graves' disease Mother        Had thyroid removed   Graves' disease Brother        Deceased     SOCIAL HISTORY:  reports that she quit smoking about 4 years ago. Her smoking use included cigarettes. She has a 1.50 pack-year smoking history. She has never used smokeless tobacco. She  reports that she does not currently use alcohol. She reports that she does not currently use drugs.  The patient is divorced and lives in [place], [place]. 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 She has a 60 year old son Harrell Gave.   ALLERGIES: Patient has no known allergies.   MEDICATIONS:  Current Outpatient Medications  Medication Sig Dispense Refill   albuterol (VENTOLIN HFA) 108 (90 Base) MCG/ACT inhaler Inhale 1-2 puffs into the lungs every 6 (six) hours as needed for wheezing or shortness of breath. 1 each 0   CALCIUM PO Take 1 tablet by mouth 3 (three) times daily.     cetirizine (ZYRTEC) 10 MG tablet Take 1 tablet (10 mg total) by mouth at bedtime. For allergies 90 tablet 0   fluticasone (FLONASE) 50 MCG/ACT nasal spray Place 1 spray into both nostrils 2 (two) times daily as needed for allergies or rhinitis. 48 mL 0   ibuprofen (ADVIL) 800 MG tablet Take 1 tablet (800 mg total) by  mouth every 8 (eight) hours as needed. 30 tablet 0   linaclotide (LINZESS) 72 MCG capsule Take 72 mcg by mouth daily as needed (constipation).     Multiple Vitamin (MULTI-VITAMIN) tablet Take 1 tablet by mouth daily.     oxyCODONE (OXY IR/ROXICODONE) 5 MG immediate release tablet Take 1 tablet (5 mg total) by mouth every 6 (six) hours as needed for severe pain. 15 tablet 0   oxyCODONE (OXY IR/ROXICODONE) 5 MG immediate release tablet Take 1 tablet (5 mg total) by mouth every 6 (six) hours as needed for severe pain. 15 tablet 0   No current facility-administered medications for this encounter.     REVIEW OF SYSTEMS: On review of systems, the patient reports that she is doing well overall. She denies any breast specific complaints.     PHYSICAL EXAM:  Unable to assess due to encounter type.    ECOG = 0  0 - Asymptomatic (Fully active, able to carry on all predisease activities without restriction)  1 - Symptomatic but completely ambulatory (Restricted in  physically strenuous activity but ambulatory and able to carry out work of a light or sedentary nature. For example, light housework, office work)  2 - Symptomatic, <50% in bed during the day (Ambulatory and capable of all self care but unable to carry out any work activities. Up and about more than 50% of waking hours)  3 - Symptomatic, >50% in bed, but not bedbound (Capable of only limited self-care, confined to bed or chair 50% or more of waking hours)  4 - Bedbound (Completely disabled. Cannot carry on any self-care. Totally confined to bed or chair)  5 - Death   Eustace Pen MM, Creech RH, Tormey DC, et al. [phone]). "Toxicity and response criteria of the [facility]". [place] Oncol. 5 (6): 649-55    LABORATORY DATA:  Lab Results  Component Value Date   WBC 5.0 09/04/2021   HGB 11.4 (L) 09/04/2021   HCT 34.7 (L) 09/04/2021   MCV 90.8 09/04/2021   PLT 231 09/04/2021   Lab Results  Component Value Date   NA 138 08/13/2021   K 3.7 08/13/2021   CL 107 08/13/2021   CO2 25 08/13/2021   Lab Results  Component Value Date   ALT 14 08/13/2021   AST 14 (L) 08/13/2021   ALKPHOS 78 08/13/2021   BILITOT 0.5 08/13/2021      RADIOGRAPHY: No results found.     IMPRESSION/PLAN: 1. Stage IB, pT2N1M0 grade 2, ER/PR positive invasive lobular carcinoma of the left breast. Dr. Lisbeth Renshaw has reviewed her pathology report. She has healed well and has decided to forgo systemic chemotherapy. We reviewed the rationale for external radiotherapy to the breast  and regional nodes to reduce risks of local recurrence followed by antiestrogen therapy. We discussed the risks, benefits, short, and long term effects of radiotherapy, as well as the curative intent, and the patient is interested in proceeding.I reviewed the delivery and logistics of radiotherapy and Dr. Lisbeth Renshaw recommends 6 1/2 weeks of radiotherapyto the left breast and regional nodes with deep inspiration breath hold  technique. She will come later this morning for simulation at which time she will sign written consent to proceed. 2. Contraceptive Counseling.  The patient has undergone prior hysterectomy and does not need pregnancy testing prior to proceeding with radiotherapy.     This encounter was conducted via telephone.  The patient has provided two factor identification and has given verbal consent for this type  of encounter and has been advised to only accept a meeting of this type in a secure network environment. The time spent during this encounter was 35 minutes including preparation, discussion, and coordination of the patient's care. The attendants for this meeting include  Hayden Pedro  and [name].  During the encounter,   Hayden Pedro was located at [facility] Radiation Oncology Department.  Marcille L Vollmer was located at home.     Carola Rhine, [facility]    **Disclaimer: This note was dictated with voice recognition software. Similar sounding words can inadvertently be transcribed and this note may contain transcription errors which may not have been corrected upon publication of note.**

## 2021-10-27 ENCOUNTER — Inpatient Hospital Stay: Payer: BC Managed Care – PPO | Attending: Hematology and Oncology | Admitting: Adult Health

## 2021-10-27 ENCOUNTER — Encounter: Payer: Self-pay | Admitting: *Deleted

## 2021-10-27 ENCOUNTER — Inpatient Hospital Stay: Payer: BC Managed Care – PPO

## 2021-10-27 ENCOUNTER — Other Ambulatory Visit: Payer: Self-pay

## 2021-10-27 ENCOUNTER — Encounter: Payer: Self-pay | Admitting: Adult Health

## 2021-10-27 VITALS — BP 131/80 | HR 72 | Temp 98.2°F | Resp 16 | Ht 65.0 in | Wt 213.8 lb

## 2021-10-27 DIAGNOSIS — C50812 Malignant neoplasm of overlapping sites of left female breast: Secondary | ICD-10-CM | POA: Diagnosis not present

## 2021-10-27 DIAGNOSIS — Z8051 Family history of malignant neoplasm of kidney: Secondary | ICD-10-CM | POA: Diagnosis not present

## 2021-10-27 DIAGNOSIS — Z90710 Acquired absence of both cervix and uterus: Secondary | ICD-10-CM | POA: Insufficient documentation

## 2021-10-27 DIAGNOSIS — Z803 Family history of malignant neoplasm of breast: Secondary | ICD-10-CM | POA: Insufficient documentation

## 2021-10-27 DIAGNOSIS — Z87891 Personal history of nicotine dependence: Secondary | ICD-10-CM | POA: Insufficient documentation

## 2021-10-27 DIAGNOSIS — Z170 Estrogen receptor positive status [ER+]: Secondary | ICD-10-CM | POA: Diagnosis not present

## 2021-10-27 LAB — CBC WITH DIFFERENTIAL/PLATELET
Abs Immature Granulocytes: 0 10*3/uL (ref 0.00–0.07)
Basophils Absolute: 0 10*3/uL (ref 0.0–0.1)
Basophils Relative: 1 %
Eosinophils Absolute: 0.1 10*3/uL (ref 0.0–0.5)
Eosinophils Relative: 2 %
HCT: 35.8 % — ABNORMAL LOW (ref 36.0–46.0)
Hemoglobin: 11.9 g/dL — ABNORMAL LOW (ref 12.0–15.0)
Immature Granulocytes: 0 %
Lymphocytes Relative: 50 %
Lymphs Abs: 2.1 10*3/uL (ref 0.7–4.0)
MCH: 30.4 pg (ref 26.0–34.0)
MCHC: 33.2 g/dL (ref 30.0–36.0)
MCV: 91.6 fL (ref 80.0–100.0)
Monocytes Absolute: 0.3 10*3/uL (ref 0.1–1.0)
Monocytes Relative: 8 %
Neutro Abs: 1.6 10*3/uL — ABNORMAL LOW (ref 1.7–7.7)
Neutrophils Relative %: 39 %
Platelets: 249 10*3/uL (ref 150–400)
RBC: 3.91 MIL/uL (ref 3.87–5.11)
RDW: 12.8 % (ref 11.5–15.5)
WBC: 4.1 10*3/uL (ref 4.0–10.5)
nRBC: 0 % (ref 0.0–0.2)

## 2021-10-27 NOTE — Assessment & Plan Note (Signed)
Nichole [name] is here today for follow-up of her estrogen positive breast cancer.  She is doing well and we reviewed her Oncotype Dx results which do not recommend chemotherapy as being beneficial for her.  We discussed that following surgery her treatment includes adjuvant radiation followed by adjuvant antiestrogen therapy.  Considering the fact that Dr. Chryl Heck discussed an injection with her and she has had a hysterectomy and one of her ovaries removed we will get some testing to evaluate her menopausal status of her remaining ovary.  Nichole [name] and I discussed the antiestrogen therapy and injection today as well.  We will get this lab testing and she will return towards the end of radiation to discuss next steps.  I will see her in approximately 4 months for her survivorship care plan visit.

## 2021-10-27 NOTE — Progress Notes (Signed)
[facility] Follow up:    [place] [address]   DIAGNOSIS:  Cancer Staging  Malignant neoplasm of overlapping sites of left breast in female, estrogen receptor positive ([place]) Staging form: Breast, AJCC 8th Edition - Pathologic: Stage IB (pT2, pN1, cM0, G2, ER+, PR+, HER2-) - Signed by Benay Pike, MD on 10/02/2021 Histologic grading system: 3 grade system   SUMMARY OF ONCOLOGIC HISTORY: Oncology History  Malignant neoplasm of overlapping sites of left breast in female, estrogen receptor positive ([place])  07/08/2021 Mammogram    Screening mammogram 07/08/2021 showed possible mass with distortion in the left breast. 07/21/2021 she had a left diagnostic mammogram which showed a suspicious left breast mass at 12 o'clock position suspicious satellite nodule left breast at 11 o'clock position Ultrasound same day showed a 16 x 20 x 12 mm irregular hypoechoic mass left breast at 12 o'clock position 4 cm from the nipple.  There is an adjacent satellite nodule within the left breast 11 o'clock position 3 cm from nipple measuring 4 x 3 x 6 mm.  No enlarged left axillary lymphadenopathy    07/30/2021 Pathology Results   Pathology from left breast mass 11:00, 3 cm from nipple showed invasive mammary carcinoma grade 2 prognostics ER 50% moderate staining, PR 95% strong staining, Ki-67 less than 5% and HER2 negative.  The second mass at 11:00 also showed similar prognostics and is grade 2.   08/11/2021 Initial Diagnosis   Malignant neoplasm of overlapping sites of left breast in female, estrogen receptor positive ([place])   08/11/2021 Cancer Staging   Staging form: Breast, AJCC 8th Edition - Pathologic: Stage IB (pT2, pN1, cM0, G2, ER+, PR+, HER2-) - Signed by Benay Pike, MD on 10/02/2021 Histologic grading system: 3 grade system    08/29/2021 Genetic Testing   Negative hereditary cancer genetic testing: no pathogenic variants detected in  [facility] CancerNext-Expanded +RNAinsight Panel.  Report date is 08/29/2021.   The CancerNext-Expanded gene panel offered by [facility] and includes sequencing, rearrangement, and RNA analysis for the following 77 genes: AIP, ALK, APC, ATM, AXIN2, BAP1, BARD1, BLM, BMPR1A, BRCA1, BRCA2, BRIP1, CDC73, CDH1, CDK4, CDKN1B, CDKN2A, CHEK2, CTNNA1, DICER1, FANCC, FH, FLCN, GALNT12, KIF1B, LZTR1, MAX, MEN1, MET, MLH1, MSH2, MSH3, MSH6, MUTYH, NBN, NF1, NF2, NTHL1, PALB2, PHOX2B, PMS2, POT1, PRKAR1A, PTCH1, PTEN, RAD51C, RAD51D, RB1, RECQL, RET, SDHA, SDHAF2, SDHB, SDHC, SDHD, SMAD4, SMARCA4, SMARCB1, SMARCE1, STK11, SUFU, TMEM127, TP53, TSC1, TSC2, VHL and XRCC2 (sequencing and deletion/duplication); EGFR, EGLN1, HOXB13, KIT, MITF, PDGFRA, POLD1, and POLE (sequencing only); EPCAM and GREM1 (deletion/duplication only).    09/11/2021 Surgery      09/11/2021 Surgery        CURRENT THERAPY: adjuvant radiation  INTERVAL HISTORY: Nichole [name] 46 y.o. female returns for follow-up after receiving her Oncotype DX results.  Her score resulted at 10 giving her a 12% risk of recurrence in 9 years with antiestrogens alone in predicting no benefit from chemotherapy.  She has already seen radiation and will start to receive this next week.  She is doing well otherwise.  She tells me that she is working from home and that sometimes after typing throughout the day her left arm will get a little bit swollen but then will recover.  She is unsure if this could possibly be early lymphedema.  She tells me that Dr. Chryl Heck had plan for her to undergo a shot and antiestrogen therapy with anastrozole.  She does not  have any menstrual cycles because she is status post a hysterectomy and she has had one of her 2 ovaries removed.   Patient Active Problem List   Diagnosis Date Noted   Genetic testing 08/29/2021   Family history of breast cancer 08/14/2021   Family history of kidney cancer 08/14/2021   Malignant neoplasm of  overlapping sites of left breast in female, estrogen receptor positive ([place]) 08/11/2021   Moderate persistent asthma with exacerbation 02/28/2021   Bilateral lower extremity edema 11/21/2020   Chronic right-sided low back pain with right-sided sciatica 08/01/2020   Preventative health care 04/11/2018   Environmental and seasonal allergies 04/11/2018   Pain of left thumb 04/11/2018   Hyperlipidemia 04/11/2018   Lumbar radiculopathy 08/16/2014   Prediabetes 08/02/2014   Morbid obesity with BMI of 40.0-44.9, adult ([place]) 08/02/2014   Tobacco abuse 08/02/2014    has No Known Allergies.  MEDICAL HISTORY: Past Medical History:  Diagnosis Date   Asthma    exercise induced asthma   Cancer ([place]) 07/29/2021   left breast   Family history of breast cancer 08/14/2021   Family history of kidney cancer 08/14/2021   Pre-diabetes     SURGICAL HISTORY: Past Surgical History:  Procedure Laterality Date   BIOPSY  03/04/2021   Procedure: BIOPSY;  Surgeon: Felicie Morn, MD;  [facility];  Service: General;;   BREAST BIOPSY Left 07/30/2021   BREAST BIOPSY Right 09/01/2021   BREAST BIOPSY Left 09/05/2021   BREAST LUMPECTOMY WITH RADIOACTIVE SEED AND SENTINEL LYMPH NODE BIOPSY Left 09/11/2021   Procedure: LEFT BREAST LUMPECTOMY WITH RADIOACTIVE SEED X3 AND LEFT SENTINEL LYMPH NODE BIOPSY;  Surgeon: Erroll Luna, MD;  Location: [facility];  Service: General;  Laterality: Left;   ESOPHAGOGASTRODUODENOSCOPY N/A 03/04/2021   Procedure: ESOPHAGOGASTRODUODENOSCOPY (EGD);  Surgeon: Felicie Morn, MD;  Location: [facility];  Service: General;  Laterality: N/A;   LAPAROSCOPIC GASTRIC SLEEVE RESECTION N/A 05/02/2021   Procedure: LAPAROSCOPIC GASTRIC SLEEVE RESECTION;  Surgeon: Felicie Morn, MD;  [facility];  Service: General;  Laterality: N/A;   LAPAROSCOPIC VAGINAL HYSTERECTOMY WITH SALPINGECTOMY N/A 10/08/2016   Procedure: LAPAROSCOPIC ASSISTED VAGINAL HYSTERECTOMY  WITH RIGHT SALPINGECTOMY LYSIS OF ADHESIONS;  Surgeon: Donnamae Jude, MD;  Location: [facility];  Service: Gynecology;  Laterality: N/A;   MYOMECTOMY  2014   RE-EXCISION OF BREAST LUMPECTOMY Left 09/24/2021   Procedure: RE-EXCISION OF LEFT BREAST LUMPECTOMY;  Surgeon: Erroll Luna, MD;  Location: [facility];  Service: General;  Laterality: Left;   Right Oophrectomy     UPPER GI ENDOSCOPY N/A 05/02/2021   Procedure: UPPER GI ENDOSCOPY;  Surgeon: Felicie Morn, MD;  [facility];  Service: General;  Laterality: N/A;   WISDOM TOOTH EXTRACTION      SOCIAL HISTORY: Social History   Socioeconomic History   Marital status: Divorced    Spouse name: Not on file   Number of children: 1   Years of education: Not on file   Highest education level: Not on file  Occupational History   Not on file  Tobacco Use   Smoking status: Former    Packs/day: 0.10    Years: 15.00    Pack years: 1.50    Types: Cigarettes    Quit date: 01/23/2017    Years since quitting: 4.7   Smokeless tobacco: Never  Vaping Use   Vaping Use: Never used  Substance and Sexual Activity   Alcohol use: Not Currently    Comment: none in  last year per pt   Drug use: Not Currently   Sexual activity: Not Currently    Birth control/protection: None  Other Topics Concern   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   Has 19 year old son.   Moved from [place] in Sept. 2015   Social Determinants of Health   Financial Resource Strain: Not on file  Food Insecurity: Not on file  Transportation Needs: Not on file  Physical Activity: Not on file  Stress: Not on file  Social Connections: Not on file  Intimate Partner Violence: Not on file    FAMILY HISTORY: Family History  Problem Relation Age of Onset   Arthritis Mother    Breast cancer Mother 41       Lumpectomy   Hyperlipidemia Mother    Hypertension Mother     Kidney cancer Mother 63       Had ablation   Graves' disease Mother        Had thyroid removed   Graves' disease Brother        Deceased    Review of Systems  Constitutional:  Negative for appetite change, chills, fatigue, fever and unexpected weight change.  HENT:   Negative for hearing loss, lump/mass and trouble swallowing.   Eyes:  Negative for eye problems and icterus.  Respiratory:  Negative for chest tightness, cough and shortness of breath.   Cardiovascular:  Negative for chest pain, leg swelling and palpitations.  Gastrointestinal:  Negative for abdominal distention, abdominal pain, constipation, diarrhea, nausea and vomiting.  Endocrine: Negative for hot flashes.  Genitourinary:  Negative for difficulty urinating.   Musculoskeletal:  Negative for arthralgias.  Skin:  Negative for itching and rash.  Neurological:  Negative for dizziness, extremity weakness, headaches and numbness.  Hematological:  Negative for adenopathy. Does not bruise/bleed easily.  Psychiatric/Behavioral:  Negative for depression. The patient is not nervous/anxious.      PHYSICAL EXAMINATION  ECOG PERFORMANCE STATUS: 1 - Symptomatic but completely ambulatory  Vitals:   10/27/21 1455  BP: 131/80  Pulse: 72  Resp: 16  Temp: 98.2 F (36.8 C)  SpO2: 100%    Physical Exam Constitutional:      General: She is not in acute distress.    Appearance: Normal appearance. She is not toxic-appearing.  HENT:     Head: Normocephalic and atraumatic.  Eyes:     General: No scleral icterus. Cardiovascular:     Rate and Rhythm: Normal rate and regular rhythm.     Pulses: Normal pulses.     Heart sounds: Normal heart sounds.  Pulmonary:     Effort: Pulmonary effort is normal.     Breath sounds: Normal breath sounds.  Chest:     Comments: Left breast status postlumpectomy which is healing well no sign of local recurrence no sign of Infection  Abdominal:     General: Abdomen is flat. Bowel sounds are  normal. There is no distension.     Palpations: Abdomen is soft.     Tenderness: There is no abdominal tenderness.  Musculoskeletal:        General: No swelling.     Cervical back: Neck supple.  Lymphadenopathy:     Cervical: No cervical adenopathy.  Skin:    General: Skin is warm and dry.     Findings: No rash.  Neurological:     General: No focal deficit  present.     Mental Status: She is alert.  Psychiatric:        Mood and Affect: Mood normal.        Behavior: Behavior normal.    LABORATORY DATA:  None for today's visit  ASSESSMENT and THERAPY PLAN:   Malignant neoplasm of overlapping sites of left breast in female, estrogen receptor positive ([place]) Dmitriy is here today for follow-up of her estrogen positive breast cancer.  She is doing well and we reviewed her Oncotype Dx results which do not recommend chemotherapy as being beneficial for her.  We discussed that following surgery her treatment includes adjuvant radiation followed by adjuvant antiestrogen therapy.  Considering the fact that Dr. Chryl Heck discussed an injection with her and she has had a hysterectomy and one of her ovaries removed we will get some testing to evaluate her menopausal status of her remaining ovary.  Dimitriu and I discussed the antiestrogen therapy and injection today as well.  We will get this lab testing and she will return towards the end of radiation to discuss next steps.  I will see her in approximately 4 months for her survivorship care plan visit.   All questions were answered. The patient knows to call the clinic with any problems, questions or concerns. We can certainly see the patient much sooner if necessary.  Total encounter time:20 minutes*in face-to-face visit time, chart review, lab review, care coordination, order entry, and documentation of the encounter time.  Wilber Bihari, NP 10/27/21 3:41 PM Medical Oncology and Hematology [facility] [place], [place] [address] Tel. [phone]    Fax. [phone]  *Total Encounter Time as defined by the Centers for Medicare and Medicaid Services includes, in addition to the face-to-face time of a patient visit (documented in the note above) non-face-to-face time: obtaining and reviewing outside history, ordering and reviewing medications, tests or procedures, care coordination (communications with other health care professionals or caregivers) and documentation in the medical record.

## 2021-10-28 ENCOUNTER — Telehealth: Payer: Self-pay | Admitting: Hematology and Oncology

## 2021-10-28 LAB — LUTEINIZING HORMONE: LH: 14.4 m[IU]/mL

## 2021-10-28 LAB — FOLLICLE STIMULATING HORMONE: FSH: 7.2 m[IU]/mL

## 2021-10-28 NOTE — Telephone Encounter (Signed)
Scheduled appointment per inbasket message. Left message.   ?

## 2021-10-30 LAB — ESTRADIOL, ULTRA SENS: Estradiol, Sensitive: 169.3 pg/mL

## 2021-10-31 DIAGNOSIS — Z170 Estrogen receptor positive status [ER+]: Secondary | ICD-10-CM | POA: Diagnosis not present

## 2021-10-31 DIAGNOSIS — Z510 Encounter for antineoplastic radiation therapy: Secondary | ICD-10-CM | POA: Diagnosis not present

## 2021-10-31 DIAGNOSIS — C50812 Malignant neoplasm of overlapping sites of left female breast: Secondary | ICD-10-CM | POA: Diagnosis not present

## 2021-11-03 ENCOUNTER — Telehealth: Payer: Self-pay

## 2021-11-03 ENCOUNTER — Ambulatory Visit
Admission: RE | Admit: 2021-11-03 | Discharge: 2021-11-03 | Disposition: A | Discharge status: Home / Self Care | Payer: BC Managed Care – PPO | Source: Ambulatory Visit | Attending: Radiation Oncology | Admitting: Radiation Oncology

## 2021-11-03 ENCOUNTER — Other Ambulatory Visit: Payer: Self-pay

## 2021-11-03 DIAGNOSIS — Z510 Encounter for antineoplastic radiation therapy: Secondary | ICD-10-CM | POA: Diagnosis not present

## 2021-11-03 DIAGNOSIS — Z170 Estrogen receptor positive status [ER+]: Secondary | ICD-10-CM | POA: Diagnosis not present

## 2021-11-03 DIAGNOSIS — C50812 Malignant neoplasm of overlapping sites of left female breast: Secondary | ICD-10-CM | POA: Diagnosis not present

## 2021-11-03 LAB — RAD ONC ARIA SESSION SUMMARY

## 2021-11-03 NOTE — Telephone Encounter (Signed)
-----   Message from Gardenia Phlegm, NP sent at 10/31/2021  6:20 PM EDT ----- Patient is still premenopausal.  Could consider zoladex and anastrozole like we discussed at her last visit.  ----- Message ----- From: [place]: 10/28/2021   6:37 AM EDT To: Gardenia Phlegm, NP

## 2021-11-03 NOTE — Telephone Encounter (Signed)
Attempted to call pt regarding results per NP. Called to advise pt these results and options will be reviewed at her appt 8/1 with Dr Chryl Heck. LVM for pt to return call for this information.

## 2021-11-04 ENCOUNTER — Other Ambulatory Visit: Payer: Self-pay

## 2021-11-04 ENCOUNTER — Ambulatory Visit
Admission: RE | Admit: 2021-11-04 | Discharge: 2021-11-04 | Disposition: A | Discharge status: Home / Self Care | Payer: BC Managed Care – PPO | Source: Ambulatory Visit | Attending: Radiation Oncology | Admitting: Radiation Oncology

## 2021-11-04 DIAGNOSIS — Z510 Encounter for antineoplastic radiation therapy: Secondary | ICD-10-CM | POA: Diagnosis not present

## 2021-11-04 DIAGNOSIS — C50812 Malignant neoplasm of overlapping sites of left female breast: Secondary | ICD-10-CM | POA: Diagnosis not present

## 2021-11-04 DIAGNOSIS — Z170 Estrogen receptor positive status [ER+]: Secondary | ICD-10-CM | POA: Diagnosis not present

## 2021-11-04 LAB — RAD ONC ARIA SESSION SUMMARY

## 2021-11-05 ENCOUNTER — Ambulatory Visit
Admission: RE | Admit: 2021-11-05 | Discharge: 2021-11-05 | Disposition: A | Discharge status: Home / Self Care | Payer: BC Managed Care – PPO | Source: Ambulatory Visit | Attending: Radiation Oncology | Admitting: Radiation Oncology

## 2021-11-05 ENCOUNTER — Other Ambulatory Visit: Payer: Self-pay

## 2021-11-05 DIAGNOSIS — Z510 Encounter for antineoplastic radiation therapy: Secondary | ICD-10-CM | POA: Diagnosis not present

## 2021-11-05 DIAGNOSIS — C50812 Malignant neoplasm of overlapping sites of left female breast: Secondary | ICD-10-CM | POA: Diagnosis not present

## 2021-11-05 DIAGNOSIS — Z170 Estrogen receptor positive status [ER+]: Secondary | ICD-10-CM | POA: Diagnosis not present

## 2021-11-05 LAB — RAD ONC ARIA SESSION SUMMARY

## 2021-11-06 ENCOUNTER — Ambulatory Visit
Admission: RE | Admit: 2021-11-06 | Discharge: 2021-11-06 | Disposition: A | Discharge status: Home / Self Care | Payer: BC Managed Care – PPO | Source: Ambulatory Visit | Attending: Radiation Oncology | Admitting: Radiation Oncology

## 2021-11-06 ENCOUNTER — Other Ambulatory Visit: Payer: Self-pay

## 2021-11-06 DIAGNOSIS — Z510 Encounter for antineoplastic radiation therapy: Secondary | ICD-10-CM | POA: Diagnosis not present

## 2021-11-06 DIAGNOSIS — C50812 Malignant neoplasm of overlapping sites of left female breast: Secondary | ICD-10-CM | POA: Diagnosis not present

## 2021-11-06 DIAGNOSIS — Z170 Estrogen receptor positive status [ER+]: Secondary | ICD-10-CM | POA: Diagnosis not present

## 2021-11-06 LAB — RAD ONC ARIA SESSION SUMMARY

## 2021-11-06 NOTE — Progress Notes (Signed)
Pt here for patient teaching.  Pt given Radiation and You booklet, skin care instructions, alra deodorant and Radiaplex.    Reviewed areas of pertinence such as fatigue, hair loss, skin changes, breast tenderness, and breast swelling. Pt able to give teach back of to pat skin and use unscented/gentle soap,apply Radiaplex bid, avoid applying anything to skin within 4 hours of treatment, avoid wearing an under wire bra, and to use an electric razor if they must shave. Pt verbalizes understanding of information given and will contact nursing with any questions or concerns.    Reshanda Lewey M. Zakyah Yanes RN, BSN  

## 2021-11-07 ENCOUNTER — Ambulatory Visit
Admission: RE | Admit: 2021-11-07 | Discharge: 2021-11-07 | Disposition: A | Discharge status: Home / Self Care | Payer: BC Managed Care – PPO | Source: Ambulatory Visit | Attending: Radiation Oncology | Admitting: Radiation Oncology

## 2021-11-07 ENCOUNTER — Other Ambulatory Visit: Payer: Self-pay

## 2021-11-07 DIAGNOSIS — Z170 Estrogen receptor positive status [ER+]: Secondary | ICD-10-CM | POA: Diagnosis not present

## 2021-11-07 DIAGNOSIS — Z510 Encounter for antineoplastic radiation therapy: Secondary | ICD-10-CM | POA: Diagnosis not present

## 2021-11-07 DIAGNOSIS — C50812 Malignant neoplasm of overlapping sites of left female breast: Secondary | ICD-10-CM | POA: Diagnosis not present

## 2021-11-07 LAB — RAD ONC ARIA SESSION SUMMARY
Course Elapsed Days: 4
Plan Fractions Treated to Date: 5
Plan Fractions Treated to Date: 5
Plan Prescribed Dose Per Fraction: 1.8 Gy
Plan Prescribed Dose Per Fraction: 1.8 Gy
Plan Total Fractions Prescribed: 28
Plan Total Fractions Prescribed: 28
Plan Total Prescribed Dose: 50.4 Gy
Plan Total Prescribed Dose: 50.4 Gy
Reference Point Dosage Given to Date: 9 Gy
Reference Point Dosage Given to Date: 9 Gy
Reference Point Session Dosage Given: 1.8 Gy
Reference Point Session Dosage Given: 1.8 Gy
Session Number: 5

## 2021-11-07 MED ORDER — RADIAPLEXRX EX GEL: Freq: Once | CUTANEOUS | Status: AC

## 2021-11-07 MED ORDER — ALRA NON-METALLIC DEODORANT (RAD-ONC)
1.0000 | Freq: Once | TOPICAL | Status: AC
  Administered 2021-11-07: 1 via TOPICAL

## 2021-11-10 ENCOUNTER — Ambulatory Visit
Admission: RE | Admit: 2021-11-10 | Discharge: 2021-11-10 | Disposition: A | Discharge status: Home / Self Care | Payer: BC Managed Care – PPO | Source: Ambulatory Visit | Attending: Radiation Oncology | Admitting: Radiation Oncology

## 2021-11-10 ENCOUNTER — Other Ambulatory Visit: Payer: Self-pay

## 2021-11-10 DIAGNOSIS — C50812 Malignant neoplasm of overlapping sites of left female breast: Secondary | ICD-10-CM | POA: Diagnosis not present

## 2021-11-10 DIAGNOSIS — Z170 Estrogen receptor positive status [ER+]: Secondary | ICD-10-CM | POA: Diagnosis not present

## 2021-11-10 DIAGNOSIS — Z510 Encounter for antineoplastic radiation therapy: Secondary | ICD-10-CM | POA: Diagnosis not present

## 2021-11-10 LAB — RAD ONC ARIA SESSION SUMMARY

## 2021-11-11 ENCOUNTER — Ambulatory Visit
Admission: RE | Admit: 2021-11-11 | Discharge: 2021-11-11 | Disposition: A | Discharge status: Home / Self Care | Payer: BC Managed Care – PPO | Source: Ambulatory Visit | Attending: Radiation Oncology | Admitting: Radiation Oncology

## 2021-11-11 ENCOUNTER — Ambulatory Visit: Payer: BC Managed Care – PPO | Admitting: Skilled Nursing Facility1

## 2021-11-11 ENCOUNTER — Other Ambulatory Visit: Payer: Self-pay

## 2021-11-11 DIAGNOSIS — Z510 Encounter for antineoplastic radiation therapy: Secondary | ICD-10-CM | POA: Diagnosis not present

## 2021-11-11 DIAGNOSIS — Z170 Estrogen receptor positive status [ER+]: Secondary | ICD-10-CM | POA: Diagnosis not present

## 2021-11-11 DIAGNOSIS — C50812 Malignant neoplasm of overlapping sites of left female breast: Secondary | ICD-10-CM | POA: Diagnosis not present

## 2021-11-11 LAB — RAD ONC ARIA SESSION SUMMARY

## 2021-11-12 ENCOUNTER — Ambulatory Visit
Admission: RE | Admit: 2021-11-12 | Discharge: 2021-11-12 | Disposition: A | Discharge status: Home / Self Care | Payer: BC Managed Care – PPO | Source: Ambulatory Visit | Attending: Radiation Oncology | Admitting: Radiation Oncology

## 2021-11-12 ENCOUNTER — Other Ambulatory Visit: Payer: Self-pay

## 2021-11-12 ENCOUNTER — Ambulatory Visit: Payer: BC Managed Care – PPO | Admitting: Skilled Nursing Facility1

## 2021-11-12 DIAGNOSIS — C50912 Malignant neoplasm of unspecified site of left female breast: Secondary | ICD-10-CM | POA: Diagnosis not present

## 2021-11-12 DIAGNOSIS — C50812 Malignant neoplasm of overlapping sites of left female breast: Secondary | ICD-10-CM | POA: Diagnosis not present

## 2021-11-12 DIAGNOSIS — Z510 Encounter for antineoplastic radiation therapy: Secondary | ICD-10-CM | POA: Diagnosis not present

## 2021-11-12 DIAGNOSIS — Z170 Estrogen receptor positive status [ER+]: Secondary | ICD-10-CM | POA: Diagnosis not present

## 2021-11-12 LAB — RAD ONC ARIA SESSION SUMMARY

## 2021-11-13 ENCOUNTER — Ambulatory Visit
Admission: RE | Admit: 2021-11-13 | Discharge: 2021-11-13 | Disposition: A | Discharge status: Home / Self Care | Payer: BC Managed Care – PPO | Source: Ambulatory Visit | Attending: Radiation Oncology | Admitting: Radiation Oncology

## 2021-11-13 ENCOUNTER — Other Ambulatory Visit: Payer: Self-pay

## 2021-11-13 DIAGNOSIS — Z170 Estrogen receptor positive status [ER+]: Secondary | ICD-10-CM | POA: Diagnosis not present

## 2021-11-13 DIAGNOSIS — Z510 Encounter for antineoplastic radiation therapy: Secondary | ICD-10-CM | POA: Diagnosis not present

## 2021-11-13 DIAGNOSIS — C50812 Malignant neoplasm of overlapping sites of left female breast: Secondary | ICD-10-CM | POA: Diagnosis not present

## 2021-11-13 LAB — RAD ONC ARIA SESSION SUMMARY

## 2021-11-14 ENCOUNTER — Ambulatory Visit
Admission: RE | Admit: 2021-11-14 | Discharge: 2021-11-14 | Disposition: A | Discharge status: Home / Self Care | Payer: BC Managed Care – PPO | Source: Ambulatory Visit | Attending: Radiation Oncology | Admitting: Radiation Oncology

## 2021-11-14 ENCOUNTER — Other Ambulatory Visit: Payer: Self-pay

## 2021-11-14 DIAGNOSIS — C50812 Malignant neoplasm of overlapping sites of left female breast: Secondary | ICD-10-CM | POA: Diagnosis not present

## 2021-11-14 DIAGNOSIS — Z170 Estrogen receptor positive status [ER+]: Secondary | ICD-10-CM | POA: Diagnosis not present

## 2021-11-14 DIAGNOSIS — Z510 Encounter for antineoplastic radiation therapy: Secondary | ICD-10-CM | POA: Diagnosis not present

## 2021-11-14 LAB — RAD ONC ARIA SESSION SUMMARY
Course Elapsed Days: 11
Plan Fractions Treated to Date: 10
Plan Fractions Treated to Date: 10
Plan Prescribed Dose Per Fraction: 1.8 Gy
Plan Prescribed Dose Per Fraction: 1.8 Gy
Plan Total Fractions Prescribed: 28
Plan Total Fractions Prescribed: 28
Plan Total Prescribed Dose: 50.4 Gy
Plan Total Prescribed Dose: 50.4 Gy
Reference Point Dosage Given to Date: 18 Gy
Reference Point Dosage Given to Date: 18 Gy
Reference Point Session Dosage Given: 1.8 Gy
Reference Point Session Dosage Given: 1.8 Gy
Session Number: 10

## 2021-11-17 ENCOUNTER — Other Ambulatory Visit: Payer: Self-pay

## 2021-11-17 ENCOUNTER — Ambulatory Visit
Admission: RE | Admit: 2021-11-17 | Discharge: 2021-11-17 | Disposition: A | Discharge status: Home / Self Care | Payer: BC Managed Care – PPO | Source: Ambulatory Visit | Attending: Radiation Oncology | Admitting: Radiation Oncology

## 2021-11-17 DIAGNOSIS — Z510 Encounter for antineoplastic radiation therapy: Secondary | ICD-10-CM | POA: Diagnosis not present

## 2021-11-17 DIAGNOSIS — C50812 Malignant neoplasm of overlapping sites of left female breast: Secondary | ICD-10-CM | POA: Diagnosis not present

## 2021-11-17 DIAGNOSIS — Z170 Estrogen receptor positive status [ER+]: Secondary | ICD-10-CM | POA: Diagnosis not present

## 2021-11-17 LAB — RAD ONC ARIA SESSION SUMMARY

## 2021-11-18 ENCOUNTER — Other Ambulatory Visit: Payer: Self-pay

## 2021-11-18 ENCOUNTER — Ambulatory Visit
Admission: RE | Admit: 2021-11-18 | Discharge: 2021-11-18 | Disposition: A | Discharge status: Home / Self Care | Payer: BC Managed Care – PPO | Source: Ambulatory Visit | Attending: Radiation Oncology | Admitting: Radiation Oncology

## 2021-11-18 DIAGNOSIS — C50812 Malignant neoplasm of overlapping sites of left female breast: Secondary | ICD-10-CM | POA: Diagnosis not present

## 2021-11-18 DIAGNOSIS — Z170 Estrogen receptor positive status [ER+]: Secondary | ICD-10-CM | POA: Diagnosis not present

## 2021-11-18 DIAGNOSIS — Z510 Encounter for antineoplastic radiation therapy: Secondary | ICD-10-CM | POA: Diagnosis not present

## 2021-11-18 LAB — RAD ONC ARIA SESSION SUMMARY

## 2021-11-19 ENCOUNTER — Ambulatory Visit
Admission: RE | Admit: 2021-11-19 | Discharge: 2021-11-19 | Disposition: A | Discharge status: Home / Self Care | Payer: BC Managed Care – PPO | Source: Ambulatory Visit | Attending: Radiation Oncology | Admitting: Radiation Oncology

## 2021-11-19 ENCOUNTER — Other Ambulatory Visit: Payer: Self-pay

## 2021-11-19 DIAGNOSIS — C50812 Malignant neoplasm of overlapping sites of left female breast: Secondary | ICD-10-CM | POA: Diagnosis not present

## 2021-11-19 DIAGNOSIS — Z510 Encounter for antineoplastic radiation therapy: Secondary | ICD-10-CM | POA: Diagnosis not present

## 2021-11-19 DIAGNOSIS — Z170 Estrogen receptor positive status [ER+]: Secondary | ICD-10-CM | POA: Diagnosis not present

## 2021-11-19 LAB — RAD ONC ARIA SESSION SUMMARY
Course Elapsed Days: 16
Plan Fractions Treated to Date: 13
Plan Fractions Treated to Date: 13
Plan Prescribed Dose Per Fraction: 1.8 Gy
Plan Prescribed Dose Per Fraction: 1.8 Gy
Plan Total Fractions Prescribed: 28
Plan Total Fractions Prescribed: 28
Plan Total Prescribed Dose: 50.4 Gy
Plan Total Prescribed Dose: 50.4 Gy
Reference Point Dosage Given to Date: 23.4 Gy
Reference Point Dosage Given to Date: 23.4 Gy
Reference Point Session Dosage Given: 1.8 Gy
Reference Point Session Dosage Given: 1.8 Gy
Session Number: 13

## 2021-11-20 ENCOUNTER — Ambulatory Visit
Admission: RE | Admit: 2021-11-20 | Discharge: 2021-11-20 | Disposition: A | Discharge status: Home / Self Care | Payer: BC Managed Care – PPO | Source: Ambulatory Visit | Attending: Radiation Oncology | Admitting: Radiation Oncology

## 2021-11-20 ENCOUNTER — Other Ambulatory Visit: Payer: Self-pay

## 2021-11-20 DIAGNOSIS — Z510 Encounter for antineoplastic radiation therapy: Secondary | ICD-10-CM | POA: Diagnosis not present

## 2021-11-20 DIAGNOSIS — C50812 Malignant neoplasm of overlapping sites of left female breast: Secondary | ICD-10-CM | POA: Diagnosis not present

## 2021-11-20 DIAGNOSIS — Z170 Estrogen receptor positive status [ER+]: Secondary | ICD-10-CM | POA: Diagnosis not present

## 2021-11-20 LAB — RAD ONC ARIA SESSION SUMMARY
Course Elapsed Days: 17
Plan Fractions Treated to Date: 14
Plan Fractions Treated to Date: 14
Plan Prescribed Dose Per Fraction: 1.8 Gy
Plan Prescribed Dose Per Fraction: 1.8 Gy
Plan Total Fractions Prescribed: 28
Plan Total Fractions Prescribed: 28
Plan Total Prescribed Dose: 50.4 Gy
Plan Total Prescribed Dose: 50.4 Gy
Reference Point Dosage Given to Date: 25.2 Gy
Reference Point Dosage Given to Date: 25.2 Gy
Reference Point Session Dosage Given: 1.8 Gy
Reference Point Session Dosage Given: 1.8 Gy
Session Number: 14

## 2021-11-21 ENCOUNTER — Ambulatory Visit: Payer: BC Managed Care – PPO

## 2021-11-24 ENCOUNTER — Ambulatory Visit
Admission: RE | Admit: 2021-11-24 | Discharge: 2021-11-24 | Disposition: A | Discharge status: Home / Self Care | Payer: BC Managed Care – PPO | Source: Ambulatory Visit | Attending: Radiation Oncology | Admitting: Radiation Oncology

## 2021-11-24 ENCOUNTER — Other Ambulatory Visit: Payer: Self-pay

## 2021-11-24 DIAGNOSIS — C50812 Malignant neoplasm of overlapping sites of left female breast: Secondary | ICD-10-CM | POA: Insufficient documentation

## 2021-11-24 DIAGNOSIS — Z170 Estrogen receptor positive status [ER+]: Secondary | ICD-10-CM | POA: Insufficient documentation

## 2021-11-24 DIAGNOSIS — Z510 Encounter for antineoplastic radiation therapy: Secondary | ICD-10-CM | POA: Diagnosis not present

## 2021-11-24 LAB — RAD ONC ARIA SESSION SUMMARY

## 2021-11-24 MED ORDER — MICONAZOLE NITRATE 2 % EX CREA: 1.0000 | TOPICAL_CREAM | Freq: Two times a day (BID) | CUTANEOUS | 0 refills | Status: DC

## 2021-11-26 ENCOUNTER — Ambulatory Visit
Admission: RE | Admit: 2021-11-26 | Discharge: 2021-11-26 | Disposition: A | Discharge status: Home / Self Care | Payer: BC Managed Care – PPO | Source: Ambulatory Visit | Attending: Radiation Oncology | Admitting: Radiation Oncology

## 2021-11-26 ENCOUNTER — Other Ambulatory Visit: Payer: Self-pay

## 2021-11-26 DIAGNOSIS — Z170 Estrogen receptor positive status [ER+]: Secondary | ICD-10-CM | POA: Diagnosis not present

## 2021-11-26 DIAGNOSIS — Z510 Encounter for antineoplastic radiation therapy: Secondary | ICD-10-CM | POA: Diagnosis not present

## 2021-11-26 DIAGNOSIS — C50812 Malignant neoplasm of overlapping sites of left female breast: Secondary | ICD-10-CM | POA: Diagnosis not present

## 2021-11-26 LAB — RAD ONC ARIA SESSION SUMMARY
Course Elapsed Days: 23
Plan Fractions Treated to Date: 16
Plan Fractions Treated to Date: 16
Plan Prescribed Dose Per Fraction: 1.8 Gy
Plan Prescribed Dose Per Fraction: 1.8 Gy
Plan Total Fractions Prescribed: 28
Plan Total Fractions Prescribed: 28
Plan Total Prescribed Dose: 50.4 Gy
Plan Total Prescribed Dose: 50.4 Gy
Reference Point Dosage Given to Date: 28.8 Gy
Reference Point Dosage Given to Date: 28.8 Gy
Reference Point Session Dosage Given: 1.8 Gy
Reference Point Session Dosage Given: 1.8 Gy
Session Number: 16

## 2021-11-27 ENCOUNTER — Other Ambulatory Visit: Payer: Self-pay

## 2021-11-27 ENCOUNTER — Ambulatory Visit
Admission: RE | Admit: 2021-11-27 | Discharge: 2021-11-27 | Disposition: A | Discharge status: Home / Self Care | Payer: BC Managed Care – PPO | Source: Ambulatory Visit | Attending: Radiation Oncology | Admitting: Radiation Oncology

## 2021-11-27 DIAGNOSIS — C50812 Malignant neoplasm of overlapping sites of left female breast: Secondary | ICD-10-CM | POA: Diagnosis not present

## 2021-11-27 DIAGNOSIS — Z170 Estrogen receptor positive status [ER+]: Secondary | ICD-10-CM | POA: Diagnosis not present

## 2021-11-27 DIAGNOSIS — Z510 Encounter for antineoplastic radiation therapy: Secondary | ICD-10-CM | POA: Diagnosis not present

## 2021-11-27 LAB — RAD ONC ARIA SESSION SUMMARY
Course Elapsed Days: 24
Plan Fractions Treated to Date: 17
Plan Fractions Treated to Date: 17
Plan Prescribed Dose Per Fraction: 1.8 Gy
Plan Prescribed Dose Per Fraction: 1.8 Gy
Plan Total Fractions Prescribed: 28
Plan Total Fractions Prescribed: 28
Plan Total Prescribed Dose: 50.4 Gy
Plan Total Prescribed Dose: 50.4 Gy
Reference Point Dosage Given to Date: 30.6 Gy
Reference Point Dosage Given to Date: 30.6 Gy
Reference Point Session Dosage Given: 1.8 Gy
Reference Point Session Dosage Given: 1.8 Gy
Session Number: 17

## 2021-11-28 ENCOUNTER — Ambulatory Visit
Admission: RE | Admit: 2021-11-28 | Discharge: 2021-11-28 | Disposition: A | Discharge status: Home / Self Care | Payer: BC Managed Care – PPO | Source: Ambulatory Visit | Attending: Radiation Oncology | Admitting: Radiation Oncology

## 2021-11-28 ENCOUNTER — Other Ambulatory Visit: Payer: Self-pay

## 2021-11-28 DIAGNOSIS — Z510 Encounter for antineoplastic radiation therapy: Secondary | ICD-10-CM | POA: Diagnosis not present

## 2021-11-28 DIAGNOSIS — C50812 Malignant neoplasm of overlapping sites of left female breast: Secondary | ICD-10-CM | POA: Diagnosis not present

## 2021-11-28 DIAGNOSIS — Z170 Estrogen receptor positive status [ER+]: Secondary | ICD-10-CM | POA: Diagnosis not present

## 2021-11-28 LAB — RAD ONC ARIA SESSION SUMMARY
Course Elapsed Days: 25
Plan Fractions Treated to Date: 18
Plan Fractions Treated to Date: 18
Plan Prescribed Dose Per Fraction: 1.8 Gy
Plan Prescribed Dose Per Fraction: 1.8 Gy
Plan Total Fractions Prescribed: 28
Plan Total Fractions Prescribed: 28
Plan Total Prescribed Dose: 50.4 Gy
Plan Total Prescribed Dose: 50.4 Gy
Reference Point Dosage Given to Date: 32.4 Gy
Reference Point Dosage Given to Date: 32.4 Gy
Reference Point Session Dosage Given: 1.8 Gy
Reference Point Session Dosage Given: 1.8 Gy
Session Number: 18

## 2021-12-01 ENCOUNTER — Other Ambulatory Visit: Payer: Self-pay

## 2021-12-01 ENCOUNTER — Ambulatory Visit
Admission: RE | Admit: 2021-12-01 | Discharge: 2021-12-01 | Disposition: A | Discharge status: Home / Self Care | Payer: BC Managed Care – PPO | Source: Ambulatory Visit | Attending: Radiation Oncology | Admitting: Radiation Oncology

## 2021-12-01 DIAGNOSIS — Z510 Encounter for antineoplastic radiation therapy: Secondary | ICD-10-CM | POA: Diagnosis not present

## 2021-12-01 DIAGNOSIS — Z170 Estrogen receptor positive status [ER+]: Secondary | ICD-10-CM | POA: Diagnosis not present

## 2021-12-01 DIAGNOSIS — C50812 Malignant neoplasm of overlapping sites of left female breast: Secondary | ICD-10-CM | POA: Diagnosis not present

## 2021-12-01 LAB — RAD ONC ARIA SESSION SUMMARY
Course Elapsed Days: 28
Plan Fractions Treated to Date: 19
Plan Fractions Treated to Date: 19
Plan Prescribed Dose Per Fraction: 1.8 Gy
Plan Prescribed Dose Per Fraction: 1.8 Gy
Plan Total Fractions Prescribed: 28
Plan Total Fractions Prescribed: 28
Plan Total Prescribed Dose: 50.4 Gy
Plan Total Prescribed Dose: 50.4 Gy
Reference Point Dosage Given to Date: 34.2 Gy
Reference Point Dosage Given to Date: 34.2 Gy
Reference Point Session Dosage Given: 1.8 Gy
Reference Point Session Dosage Given: 1.8 Gy
Session Number: 19

## 2021-12-02 ENCOUNTER — Ambulatory Visit
Admission: RE | Admit: 2021-12-02 | Discharge: 2021-12-02 | Disposition: A | Discharge status: Home / Self Care | Payer: BC Managed Care – PPO | Source: Ambulatory Visit | Attending: Radiation Oncology | Admitting: Radiation Oncology

## 2021-12-02 ENCOUNTER — Other Ambulatory Visit: Payer: Self-pay

## 2021-12-02 DIAGNOSIS — Z510 Encounter for antineoplastic radiation therapy: Secondary | ICD-10-CM | POA: Diagnosis not present

## 2021-12-02 DIAGNOSIS — C50812 Malignant neoplasm of overlapping sites of left female breast: Secondary | ICD-10-CM | POA: Diagnosis not present

## 2021-12-02 DIAGNOSIS — Z170 Estrogen receptor positive status [ER+]: Secondary | ICD-10-CM | POA: Diagnosis not present

## 2021-12-02 LAB — RAD ONC ARIA SESSION SUMMARY
Course Elapsed Days: 29
Plan Fractions Treated to Date: 20
Plan Fractions Treated to Date: 20
Plan Prescribed Dose Per Fraction: 1.8 Gy
Plan Prescribed Dose Per Fraction: 1.8 Gy
Plan Total Fractions Prescribed: 28
Plan Total Fractions Prescribed: 28
Plan Total Prescribed Dose: 50.4 Gy
Plan Total Prescribed Dose: 50.4 Gy
Reference Point Dosage Given to Date: 36 Gy
Reference Point Dosage Given to Date: 36 Gy
Reference Point Session Dosage Given: 1.8 Gy
Reference Point Session Dosage Given: 1.8 Gy
Session Number: 20

## 2021-12-03 ENCOUNTER — Other Ambulatory Visit: Payer: Self-pay

## 2021-12-03 ENCOUNTER — Ambulatory Visit
Admission: RE | Admit: 2021-12-03 | Discharge: 2021-12-03 | Disposition: A | Discharge status: Home / Self Care | Payer: BC Managed Care – PPO | Source: Ambulatory Visit | Attending: Radiation Oncology | Admitting: Radiation Oncology

## 2021-12-03 DIAGNOSIS — Z170 Estrogen receptor positive status [ER+]: Secondary | ICD-10-CM | POA: Diagnosis not present

## 2021-12-03 DIAGNOSIS — C50812 Malignant neoplasm of overlapping sites of left female breast: Secondary | ICD-10-CM | POA: Diagnosis not present

## 2021-12-03 DIAGNOSIS — Z510 Encounter for antineoplastic radiation therapy: Secondary | ICD-10-CM | POA: Diagnosis not present

## 2021-12-03 LAB — RAD ONC ARIA SESSION SUMMARY
Course Elapsed Days: 30
Plan Fractions Treated to Date: 21
Plan Fractions Treated to Date: 21
Plan Prescribed Dose Per Fraction: 1.8 Gy
Plan Prescribed Dose Per Fraction: 1.8 Gy
Plan Total Fractions Prescribed: 28
Plan Total Fractions Prescribed: 28
Plan Total Prescribed Dose: 50.4 Gy
Plan Total Prescribed Dose: 50.4 Gy
Reference Point Dosage Given to Date: 37.8 Gy
Reference Point Dosage Given to Date: 37.8 Gy
Reference Point Session Dosage Given: 1.8 Gy
Reference Point Session Dosage Given: 1.8 Gy
Session Number: 21

## 2021-12-04 ENCOUNTER — Ambulatory Visit
Admission: RE | Admit: 2021-12-04 | Discharge: 2021-12-04 | Disposition: A | Discharge status: Home / Self Care | Payer: BC Managed Care – PPO | Source: Ambulatory Visit | Attending: Radiation Oncology | Admitting: Radiation Oncology

## 2021-12-04 ENCOUNTER — Other Ambulatory Visit: Payer: Self-pay

## 2021-12-04 DIAGNOSIS — Z510 Encounter for antineoplastic radiation therapy: Secondary | ICD-10-CM | POA: Diagnosis not present

## 2021-12-04 DIAGNOSIS — Z170 Estrogen receptor positive status [ER+]: Secondary | ICD-10-CM | POA: Diagnosis not present

## 2021-12-04 DIAGNOSIS — C50812 Malignant neoplasm of overlapping sites of left female breast: Secondary | ICD-10-CM | POA: Diagnosis not present

## 2021-12-04 LAB — RAD ONC ARIA SESSION SUMMARY
Course Elapsed Days: 31
Plan Fractions Treated to Date: 22
Plan Fractions Treated to Date: 22
Plan Prescribed Dose Per Fraction: 1.8 Gy
Plan Prescribed Dose Per Fraction: 1.8 Gy
Plan Total Fractions Prescribed: 28
Plan Total Fractions Prescribed: 28
Plan Total Prescribed Dose: 50.4 Gy
Plan Total Prescribed Dose: 50.4 Gy
Reference Point Dosage Given to Date: 39.6 Gy
Reference Point Dosage Given to Date: 39.6 Gy
Reference Point Session Dosage Given: 1.8 Gy
Reference Point Session Dosage Given: 1.8 Gy
Session Number: 22

## 2021-12-05 ENCOUNTER — Ambulatory Visit
Admission: RE | Admit: 2021-12-05 | Discharge: 2021-12-05 | Disposition: A | Discharge status: Home / Self Care | Payer: BC Managed Care – PPO | Source: Ambulatory Visit | Attending: Radiation Oncology | Admitting: Radiation Oncology

## 2021-12-05 ENCOUNTER — Ambulatory Visit: Payer: BC Managed Care – PPO | Admitting: Radiation Oncology

## 2021-12-05 ENCOUNTER — Other Ambulatory Visit: Payer: Self-pay

## 2021-12-05 DIAGNOSIS — Z510 Encounter for antineoplastic radiation therapy: Secondary | ICD-10-CM | POA: Diagnosis not present

## 2021-12-05 DIAGNOSIS — C50812 Malignant neoplasm of overlapping sites of left female breast: Secondary | ICD-10-CM | POA: Diagnosis not present

## 2021-12-05 DIAGNOSIS — Z170 Estrogen receptor positive status [ER+]: Secondary | ICD-10-CM | POA: Diagnosis not present

## 2021-12-05 LAB — RAD ONC ARIA SESSION SUMMARY
Course Elapsed Days: 32
Plan Fractions Treated to Date: 23
Plan Fractions Treated to Date: 23
Plan Prescribed Dose Per Fraction: 1.8 Gy
Plan Prescribed Dose Per Fraction: 1.8 Gy
Plan Total Fractions Prescribed: 28
Plan Total Fractions Prescribed: 28
Plan Total Prescribed Dose: 50.4 Gy
Plan Total Prescribed Dose: 50.4 Gy
Reference Point Dosage Given to Date: 41.4 Gy
Reference Point Dosage Given to Date: 41.4 Gy
Reference Point Session Dosage Given: 1.8 Gy
Reference Point Session Dosage Given: 1.8 Gy
Session Number: 23

## 2021-12-08 ENCOUNTER — Other Ambulatory Visit: Payer: Self-pay

## 2021-12-08 ENCOUNTER — Ambulatory Visit
Admission: RE | Admit: 2021-12-08 | Discharge: 2021-12-08 | Disposition: A | Discharge status: Home / Self Care | Payer: BC Managed Care – PPO | Source: Ambulatory Visit | Attending: Radiation Oncology | Admitting: Radiation Oncology

## 2021-12-08 DIAGNOSIS — Z510 Encounter for antineoplastic radiation therapy: Secondary | ICD-10-CM | POA: Diagnosis not present

## 2021-12-08 DIAGNOSIS — C50812 Malignant neoplasm of overlapping sites of left female breast: Secondary | ICD-10-CM | POA: Diagnosis not present

## 2021-12-08 DIAGNOSIS — Z170 Estrogen receptor positive status [ER+]: Secondary | ICD-10-CM | POA: Diagnosis not present

## 2021-12-08 LAB — RAD ONC ARIA SESSION SUMMARY
Course Elapsed Days: 35
Plan Fractions Treated to Date: 24
Plan Fractions Treated to Date: 24
Plan Prescribed Dose Per Fraction: 1.8 Gy
Plan Prescribed Dose Per Fraction: 1.8 Gy
Plan Total Fractions Prescribed: 28
Plan Total Fractions Prescribed: 28
Plan Total Prescribed Dose: 50.4 Gy
Plan Total Prescribed Dose: 50.4 Gy
Reference Point Dosage Given to Date: 43.2 Gy
Reference Point Dosage Given to Date: 43.2 Gy
Reference Point Session Dosage Given: 1.8 Gy
Reference Point Session Dosage Given: 1.8 Gy
Session Number: 24

## 2021-12-09 ENCOUNTER — Other Ambulatory Visit: Payer: Self-pay

## 2021-12-09 ENCOUNTER — Ambulatory Visit
Admission: RE | Admit: 2021-12-09 | Discharge: 2021-12-09 | Disposition: A | Discharge status: Home / Self Care | Payer: BC Managed Care – PPO | Source: Ambulatory Visit | Attending: Radiation Oncology | Admitting: Radiation Oncology

## 2021-12-09 DIAGNOSIS — Z170 Estrogen receptor positive status [ER+]: Secondary | ICD-10-CM | POA: Diagnosis not present

## 2021-12-09 DIAGNOSIS — Z510 Encounter for antineoplastic radiation therapy: Secondary | ICD-10-CM | POA: Diagnosis not present

## 2021-12-09 DIAGNOSIS — C50812 Malignant neoplasm of overlapping sites of left female breast: Secondary | ICD-10-CM | POA: Diagnosis not present

## 2021-12-09 LAB — RAD ONC ARIA SESSION SUMMARY
Course Elapsed Days: 36
Plan Fractions Treated to Date: 25
Plan Fractions Treated to Date: 25
Plan Prescribed Dose Per Fraction: 1.8 Gy
Plan Prescribed Dose Per Fraction: 1.8 Gy
Plan Total Fractions Prescribed: 28
Plan Total Fractions Prescribed: 28
Plan Total Prescribed Dose: 50.4 Gy
Plan Total Prescribed Dose: 50.4 Gy
Reference Point Dosage Given to Date: 45 Gy
Reference Point Dosage Given to Date: 45 Gy
Reference Point Session Dosage Given: 1.8 Gy
Reference Point Session Dosage Given: 1.8 Gy
Session Number: 25

## 2021-12-10 ENCOUNTER — Encounter: Payer: BC Managed Care – PPO | Attending: Surgery | Admitting: Skilled Nursing Facility1

## 2021-12-10 ENCOUNTER — Encounter: Payer: Self-pay | Admitting: Skilled Nursing Facility1

## 2021-12-10 ENCOUNTER — Other Ambulatory Visit: Payer: Self-pay

## 2021-12-10 ENCOUNTER — Ambulatory Visit
Admission: RE | Admit: 2021-12-10 | Discharge: 2021-12-10 | Disposition: A | Discharge status: Home / Self Care | Payer: BC Managed Care – PPO | Source: Ambulatory Visit | Attending: Radiation Oncology | Admitting: Radiation Oncology

## 2021-12-10 DIAGNOSIS — Z713 Dietary counseling and surveillance: Secondary | ICD-10-CM | POA: Diagnosis not present

## 2021-12-10 DIAGNOSIS — Z510 Encounter for antineoplastic radiation therapy: Secondary | ICD-10-CM | POA: Diagnosis not present

## 2021-12-10 DIAGNOSIS — Z170 Estrogen receptor positive status [ER+]: Secondary | ICD-10-CM | POA: Diagnosis not present

## 2021-12-10 DIAGNOSIS — Z6833 Body mass index (BMI) 33.0-33.9, adult: Secondary | ICD-10-CM | POA: Insufficient documentation

## 2021-12-10 DIAGNOSIS — E669 Obesity, unspecified: Secondary | ICD-10-CM

## 2021-12-10 DIAGNOSIS — C50812 Malignant neoplasm of overlapping sites of left female breast: Secondary | ICD-10-CM | POA: Diagnosis not present

## 2021-12-10 LAB — RAD ONC ARIA SESSION SUMMARY
Course Elapsed Days: 37
Plan Fractions Treated to Date: 26
Plan Fractions Treated to Date: 26
Plan Prescribed Dose Per Fraction: 1.8 Gy
Plan Prescribed Dose Per Fraction: 1.8 Gy
Plan Total Fractions Prescribed: 28
Plan Total Fractions Prescribed: 28
Plan Total Prescribed Dose: 50.4 Gy
Plan Total Prescribed Dose: 50.4 Gy
Reference Point Dosage Given to Date: 46.8 Gy
Reference Point Dosage Given to Date: 46.8 Gy
Reference Point Session Dosage Given: 1.8 Gy
Reference Point Session Dosage Given: 1.8 Gy
Session Number: 26

## 2021-12-10 NOTE — Progress Notes (Signed)
Bariatric Nutrition Follow-Up Visit Medical Nutrition Therapy    NUTRITION ASSESSMENT   Surgery date: 05/02/2022 Surgery type: sleeve Start weight at [facility]: 262 Weight today: 208.8 pounds  Body Composition Scale 05/20/2021 07/02/2021 09/30/2021 12/10/2021  Current Body Weight 236.6 230 214.3 208.8  Total Body Fat % 43 42 40.1 38.8  Visceral Fat '13 13 11 10  '$ Fat-Free Mass % 56.9 57.9 59.8 61.1   Total Body Water % 42.9 43.4 44.4 45  Muscle-Mass lbs 32.7 32.9 32.7 33.2  BMI 39 37.5 34.9 33.2  Body Fat Displacement             Torso  lbs 63.1 59.9 53.2 50.1         Left Leg  lbs 12.6 11.9 10.6 10         Right Leg  lbs 12.6 11.9 10.6 10         Left Arm  lbs 6.3 5.9 5.3 5         Right Arm   lbs 6.3 5.9 5.3 5   Clinical  Medical hx: prediabetes, Asthma  Medications: see list  Labs: A1C 6.0 Notable signs/symptoms: some back pain Any previous deficiencies? No   Lifestyle & Dietary Hx  Pt is truly doing a phenomenal; job with her diet and focused on healthy food decisions and already plans to be more active when not in so much pain. .   Pt states she has breast cancer. Currently doing Radiation for a total 6.5 weeks stating it is painful but all in all going well.  On the weekends crashes and sleeps most of the day: on the weekends having 20 grams greek yogurt and boiled egg and watermelon mostly snacking on fruit. Pt states he makes a boiled fruit tea with honey and cinnamon  Pt states she boils eggs for the week.   Estimated daily fluid intake: 64 oz Estimated daily protein intake: 60 g Supplements: not taking the multi and taking the calcium  Current average weekly physical activity: treadmill with inclines and speed intervals,  upper body weights   24-Hr Dietary Recall First Meal: 2 eggs + cheese stick + [place] bacon or sausage Snack: 8-10 macadamia Second Meal: chicken or beans Snack:   Third Meal: [place] + greeb or yellow  Snack:  Beverages: 12 ounce coffee +  splenda + flavored creamer, water, diet green tea  Post-Op Goals/ Signs/ Symptoms Using straws: no Drinking while eating: no Chewing/swallowing difficulties: no Changes in vision: no Changes to mood/headaches: no Hair loss/changes to skin/nails: no Difficulty focusing/concentrating: no Sweating: no Limb weakness: no Dizziness/lightheadedness: no Palpitations: no  Carbonated/caffeinated beverages: no N/V/D/C/Gas: constipation less often  Abdominal pain: no Dumping syndrome: no    NUTRITION DIAGNOSIS  Overweight/obesity ([place]-3.3) related to past poor dietary habits and physical inactivity as evidenced by completed bariatric surgery and following dietary guidelines for continued weight loss and healthy nutrition status.     NUTRITION INTERVENTION Nutrition counseling (C-1) and education (E-2) to facilitate bariatric surgery goals, including:  Goals: -you are doing so great!!   Handouts Previously Provided Include  Tofu recipe Plant based protein options Plant based meal ideas: 21 days of meals   Learning Style & Readiness for Change Teaching method utilized: Visual & Auditory  Demonstrated degree of understanding via: Teach Back  Readiness Level: action Barriers to learning/adherence to lifestyle change: none identified   RD's Notes for Next Visit Assess adherence to pt chosen goals    MONITORING & EVALUATION Dietary intake, weekly  physical activity, body weight  Next Steps Patient is to follow-up in 3 months

## 2021-12-11 ENCOUNTER — Ambulatory Visit
Admission: RE | Admit: 2021-12-11 | Discharge: 2021-12-11 | Disposition: A | Discharge status: Home / Self Care | Payer: BC Managed Care – PPO | Source: Ambulatory Visit | Attending: Radiation Oncology | Admitting: Radiation Oncology

## 2021-12-11 ENCOUNTER — Other Ambulatory Visit: Payer: Self-pay

## 2021-12-11 DIAGNOSIS — Z510 Encounter for antineoplastic radiation therapy: Secondary | ICD-10-CM | POA: Diagnosis not present

## 2021-12-11 DIAGNOSIS — Z170 Estrogen receptor positive status [ER+]: Secondary | ICD-10-CM | POA: Diagnosis not present

## 2021-12-11 DIAGNOSIS — C50812 Malignant neoplasm of overlapping sites of left female breast: Secondary | ICD-10-CM | POA: Diagnosis not present

## 2021-12-11 LAB — RAD ONC ARIA SESSION SUMMARY
Course Elapsed Days: 38
Plan Fractions Treated to Date: 27
Plan Fractions Treated to Date: 27
Plan Prescribed Dose Per Fraction: 1.8 Gy
Plan Prescribed Dose Per Fraction: 1.8 Gy
Plan Total Fractions Prescribed: 28
Plan Total Fractions Prescribed: 28
Plan Total Prescribed Dose: 50.4 Gy
Plan Total Prescribed Dose: 50.4 Gy
Reference Point Dosage Given to Date: 48.6 Gy
Reference Point Dosage Given to Date: 48.6 Gy
Reference Point Session Dosage Given: 0.2556 Gy
Reference Point Session Dosage Given: 1.8 Gy
Session Number: 27

## 2021-12-12 ENCOUNTER — Ambulatory Visit
Admission: RE | Admit: 2021-12-12 | Discharge: 2021-12-12 | Disposition: A | Discharge status: Home / Self Care | Payer: BC Managed Care – PPO | Source: Ambulatory Visit | Attending: Radiation Oncology | Admitting: Radiation Oncology

## 2021-12-12 ENCOUNTER — Other Ambulatory Visit: Payer: Self-pay

## 2021-12-12 ENCOUNTER — Ambulatory Visit: Payer: BC Managed Care – PPO

## 2021-12-12 DIAGNOSIS — Z9011 Acquired absence of right breast and nipple: Secondary | ICD-10-CM | POA: Diagnosis not present

## 2021-12-12 DIAGNOSIS — Z170 Estrogen receptor positive status [ER+]: Secondary | ICD-10-CM | POA: Diagnosis not present

## 2021-12-12 DIAGNOSIS — C50812 Malignant neoplasm of overlapping sites of left female breast: Secondary | ICD-10-CM | POA: Diagnosis not present

## 2021-12-12 DIAGNOSIS — Z510 Encounter for antineoplastic radiation therapy: Secondary | ICD-10-CM | POA: Diagnosis not present

## 2021-12-12 DIAGNOSIS — C50912 Malignant neoplasm of unspecified site of left female breast: Secondary | ICD-10-CM | POA: Diagnosis not present

## 2021-12-12 LAB — RAD ONC ARIA SESSION SUMMARY
Course Elapsed Days: 39
Plan Fractions Treated to Date: 28
Plan Fractions Treated to Date: 28
Plan Prescribed Dose Per Fraction: 1.8 Gy
Plan Prescribed Dose Per Fraction: 1.8 Gy
Plan Total Fractions Prescribed: 28
Plan Total Fractions Prescribed: 28
Plan Total Prescribed Dose: 50.4 Gy
Plan Total Prescribed Dose: 50.4 Gy
Reference Point Dosage Given to Date: 50.4 Gy
Reference Point Dosage Given to Date: 50.4 Gy
Reference Point Session Dosage Given: 1.8 Gy
Reference Point Session Dosage Given: 1.8 Gy
Session Number: 28

## 2021-12-15 ENCOUNTER — Other Ambulatory Visit: Payer: Self-pay

## 2021-12-15 ENCOUNTER — Ambulatory Visit: Payer: BC Managed Care – PPO

## 2021-12-15 DIAGNOSIS — Z170 Estrogen receptor positive status [ER+]: Secondary | ICD-10-CM | POA: Diagnosis not present

## 2021-12-15 DIAGNOSIS — C50812 Malignant neoplasm of overlapping sites of left female breast: Secondary | ICD-10-CM | POA: Diagnosis not present

## 2021-12-15 DIAGNOSIS — Z510 Encounter for antineoplastic radiation therapy: Secondary | ICD-10-CM | POA: Diagnosis not present

## 2021-12-15 LAB — RAD ONC ARIA SESSION SUMMARY
Course Elapsed Days: 42
Plan Fractions Treated to Date: 1
Plan Prescribed Dose Per Fraction: 2 Gy
Plan Total Fractions Prescribed: 5
Plan Total Prescribed Dose: 10 Gy
Reference Point Dosage Given to Date: 52.4 Gy
Reference Point Session Dosage Given: 2 Gy
Session Number: 29

## 2021-12-16 ENCOUNTER — Other Ambulatory Visit: Payer: Self-pay

## 2021-12-16 ENCOUNTER — Ambulatory Visit
Admission: RE | Admit: 2021-12-16 | Discharge: 2021-12-16 | Disposition: A | Discharge status: Home / Self Care | Payer: BC Managed Care – PPO | Source: Ambulatory Visit | Attending: Radiation Oncology | Admitting: Radiation Oncology

## 2021-12-16 DIAGNOSIS — Z170 Estrogen receptor positive status [ER+]: Secondary | ICD-10-CM | POA: Diagnosis not present

## 2021-12-16 DIAGNOSIS — Z510 Encounter for antineoplastic radiation therapy: Secondary | ICD-10-CM | POA: Diagnosis not present

## 2021-12-16 DIAGNOSIS — C50812 Malignant neoplasm of overlapping sites of left female breast: Secondary | ICD-10-CM | POA: Diagnosis not present

## 2021-12-16 LAB — RAD ONC ARIA SESSION SUMMARY
Course Elapsed Days: 43
Plan Fractions Treated to Date: 2
Plan Prescribed Dose Per Fraction: 2 Gy
Plan Total Fractions Prescribed: 5
Plan Total Prescribed Dose: 10 Gy
Reference Point Dosage Given to Date: 54.4 Gy
Reference Point Session Dosage Given: 2 Gy
Session Number: 30

## 2021-12-17 ENCOUNTER — Encounter: Payer: Self-pay | Admitting: Radiation Oncology

## 2021-12-17 ENCOUNTER — Other Ambulatory Visit: Payer: Self-pay

## 2021-12-17 ENCOUNTER — Ambulatory Visit
Admission: RE | Admit: 2021-12-17 | Discharge: 2021-12-17 | Disposition: A | Discharge status: Home / Self Care | Payer: BC Managed Care – PPO | Source: Ambulatory Visit | Attending: Radiation Oncology | Admitting: Radiation Oncology

## 2021-12-17 DIAGNOSIS — Z170 Estrogen receptor positive status [ER+]: Secondary | ICD-10-CM | POA: Diagnosis not present

## 2021-12-17 DIAGNOSIS — C50812 Malignant neoplasm of overlapping sites of left female breast: Secondary | ICD-10-CM | POA: Diagnosis not present

## 2021-12-17 DIAGNOSIS — Z510 Encounter for antineoplastic radiation therapy: Secondary | ICD-10-CM | POA: Diagnosis not present

## 2021-12-17 LAB — RAD ONC ARIA SESSION SUMMARY
Course Elapsed Days: 44
Plan Fractions Treated to Date: 3
Plan Prescribed Dose Per Fraction: 2 Gy
Plan Total Fractions Prescribed: 5
Plan Total Prescribed Dose: 10 Gy
Reference Point Dosage Given to Date: 56.4 Gy
Reference Point Session Dosage Given: 2 Gy
Session Number: 31

## 2021-12-17 MED ORDER — SILVER SULFADIAZINE 1 % EX CREA: TOPICAL_CREAM | Freq: Once | CUTANEOUS | Status: AC

## 2021-12-17 NOTE — Progress Notes (Signed)
The patient was seen today in the treatment area after having concerns about drainage on the inframammary region of her breast.  She is currently receiving radiation for  Stage IB, pT2N1M0 grade 2, ER/PR positive invasive lobular carcinoma of the left breast.  She has completed her whole breast fractionation and is currently receiving her boost, she has 2 additional treatments and finishes on Friday of this week.  She has had some dry desquamation in the left axillary base and inframammary fold until this time.  On examination however she has cell islands present but moist desquamation in the inframammary fold, fibrin is noted.  No evidence of superinfection is appreciated.  We confirmed that the patient does not have any allergies to sulfa antibiotics and discussed the use of Silvadene.  She was provided a jar of this to apply twice daily in addition to the radio Plex to the remaining part of her breast.  We discussed that if this does not improve, we would consider hydrogel pads as well.  She reports that she is not in any pain and declines any pain medication.  We will continue to follow her through the course of her treatment and subsequently thereafter.

## 2021-12-18 ENCOUNTER — Ambulatory Visit: Payer: BC Managed Care – PPO

## 2021-12-18 ENCOUNTER — Ambulatory Visit
Admission: RE | Admit: 2021-12-18 | Discharge: 2021-12-18 | Disposition: A | Discharge status: Home / Self Care | Payer: BC Managed Care – PPO | Source: Ambulatory Visit | Attending: Radiation Oncology | Admitting: Radiation Oncology

## 2021-12-18 ENCOUNTER — Encounter: Payer: Self-pay | Admitting: *Deleted

## 2021-12-18 ENCOUNTER — Other Ambulatory Visit: Payer: Self-pay

## 2021-12-18 DIAGNOSIS — C50812 Malignant neoplasm of overlapping sites of left female breast: Secondary | ICD-10-CM | POA: Diagnosis not present

## 2021-12-18 DIAGNOSIS — Z510 Encounter for antineoplastic radiation therapy: Secondary | ICD-10-CM | POA: Diagnosis not present

## 2021-12-18 DIAGNOSIS — Z170 Estrogen receptor positive status [ER+]: Secondary | ICD-10-CM | POA: Diagnosis not present

## 2021-12-18 LAB — RAD ONC ARIA SESSION SUMMARY
Course Elapsed Days: 45
Plan Fractions Treated to Date: 4
Plan Prescribed Dose Per Fraction: 2 Gy
Plan Total Fractions Prescribed: 5
Plan Total Prescribed Dose: 10 Gy
Reference Point Dosage Given to Date: 58.4 Gy
Reference Point Session Dosage Given: 2 Gy
Session Number: 32

## 2021-12-19 ENCOUNTER — Other Ambulatory Visit: Payer: Self-pay

## 2021-12-19 ENCOUNTER — Ambulatory Visit
Admission: RE | Admit: 2021-12-19 | Discharge: 2021-12-19 | Disposition: A | Discharge status: Home / Self Care | Payer: BC Managed Care – PPO | Source: Ambulatory Visit | Attending: Radiation Oncology | Admitting: Radiation Oncology

## 2021-12-19 ENCOUNTER — Encounter: Payer: Self-pay | Admitting: Radiation Oncology

## 2021-12-19 DIAGNOSIS — Z170 Estrogen receptor positive status [ER+]: Secondary | ICD-10-CM | POA: Diagnosis not present

## 2021-12-19 DIAGNOSIS — C50812 Malignant neoplasm of overlapping sites of left female breast: Secondary | ICD-10-CM | POA: Diagnosis not present

## 2021-12-19 DIAGNOSIS — Z510 Encounter for antineoplastic radiation therapy: Secondary | ICD-10-CM | POA: Diagnosis not present

## 2021-12-19 LAB — RAD ONC ARIA SESSION SUMMARY
Course Elapsed Days: 46
Plan Fractions Treated to Date: 5
Plan Prescribed Dose Per Fraction: 2 Gy
Plan Total Fractions Prescribed: 5
Plan Total Prescribed Dose: 10 Gy
Reference Point Dosage Given to Date: 60.4 Gy
Reference Point Session Dosage Given: 2 Gy
Session Number: 33

## 2021-12-23 ENCOUNTER — Inpatient Hospital Stay: Payer: BC Managed Care – PPO | Attending: Hematology and Oncology | Admitting: Hematology and Oncology

## 2021-12-23 ENCOUNTER — Encounter: Payer: Self-pay | Admitting: Hematology and Oncology

## 2021-12-23 ENCOUNTER — Other Ambulatory Visit: Payer: Self-pay

## 2021-12-23 DIAGNOSIS — Z170 Estrogen receptor positive status [ER+]: Secondary | ICD-10-CM | POA: Diagnosis not present

## 2021-12-23 DIAGNOSIS — C50812 Malignant neoplasm of overlapping sites of left female breast: Secondary | ICD-10-CM | POA: Diagnosis not present

## 2021-12-23 DIAGNOSIS — Z8051 Family history of malignant neoplasm of kidney: Secondary | ICD-10-CM | POA: Diagnosis not present

## 2021-12-23 DIAGNOSIS — Z5111 Encounter for antineoplastic chemotherapy: Secondary | ICD-10-CM | POA: Insufficient documentation

## 2021-12-23 DIAGNOSIS — Z803 Family history of malignant neoplasm of breast: Secondary | ICD-10-CM | POA: Diagnosis not present

## 2021-12-23 DIAGNOSIS — Z87891 Personal history of nicotine dependence: Secondary | ICD-10-CM | POA: Diagnosis not present

## 2021-12-23 MED ORDER — TAMOXIFEN CITRATE 20 MG PO TABS: 20.0000 mg | ORAL_TABLET | Freq: Every day | ORAL | 3 refills | Status: DC

## 2021-12-23 NOTE — Progress Notes (Signed)
[facility] NOTE  Patient Care Team: Pleas Koch, NP as PCP - General (Nurse Practitioner) Mauro Kaufmann, RN as Oncology Nurse Navigator Rockwell Germany, RN as Oncology Nurse Navigator Benay Pike, MD as Consulting Physician (Hematology and Oncology)  CHIEF COMPLAINTS/PURPOSE OF CONSULTATION:  Newly diagnosed breast cancer  HISTORY OF PRESENTING ILLNESS:   Nichole [name] 46 y.o. female is here because of recent diagnosis of left breast IDC  SUMMARY OF ONCOLOGIC HISTORY: Oncology History  Malignant neoplasm of overlapping sites of left breast in female, estrogen receptor positive ([place])  07/08/2021 Mammogram    Screening mammogram 07/08/2021 showed possible mass with distortion in the left breast. 07/21/2021 she had a left diagnostic mammogram which showed a suspicious left breast mass at 12 o'clock position suspicious satellite nodule left breast at 11 o'clock position Ultrasound same day showed a 16 x 20 x 12 mm irregular hypoechoic mass left breast at 12 o'clock position 4 cm from the nipple.  There is an adjacent satellite nodule within the left breast 11 o'clock position 3 cm from nipple measuring 4 x 3 x 6 mm.  No enlarged left axillary lymphadenopathy    07/30/2021 Pathology Results   Pathology from left breast mass 11:00, 3 cm from nipple showed invasive mammary carcinoma grade 2 prognostics ER 50% moderate staining, PR 95% strong staining, Ki-67 less than 5% and HER2 negative.  The second mass at 11:00 also showed similar prognostics and is grade 2.   08/11/2021 Initial Diagnosis   Malignant neoplasm of overlapping sites of left breast in female, estrogen receptor positive ([place])   08/11/2021 Cancer Staging   Staging form: Breast, AJCC 8th Edition - Pathologic: Stage IB (pT2, pN1, cM0, G2, ER+, PR+, HER2-) - Signed by Benay Pike, MD on 10/02/2021 Histologic grading system: 3 grade system   08/29/2021 Genetic Testing   Negative hereditary  cancer genetic testing: no pathogenic variants detected in [facility] CancerNext-Expanded +RNAinsight Panel.  Report date is 08/29/2021.   The CancerNext-Expanded gene panel offered by [facility] and includes sequencing, rearrangement, and RNA analysis for the following 77 genes: AIP, ALK, APC, ATM, AXIN2, BAP1, BARD1, BLM, BMPR1A, BRCA1, BRCA2, BRIP1, CDC73, CDH1, CDK4, CDKN1B, CDKN2A, CHEK2, CTNNA1, DICER1, FANCC, FH, FLCN, GALNT12, KIF1B, LZTR1, MAX, MEN1, MET, MLH1, MSH2, MSH3, MSH6, MUTYH, NBN, NF1, NF2, NTHL1, PALB2, PHOX2B, PMS2, POT1, PRKAR1A, PTCH1, PTEN, RAD51C, RAD51D, RB1, RECQL, RET, SDHA, SDHAF2, SDHB, SDHC, SDHD, SMAD4, SMARCA4, SMARCB1, SMARCE1, STK11, SUFU, TMEM127, TP53, TSC1, TSC2, VHL and XRCC2 (sequencing and deletion/duplication); EGFR, EGLN1, HOXB13, KIT, MITF, PDGFRA, POLD1, and POLE (sequencing only); EPCAM and GREM1 (deletion/duplication only).    09/11/2021 Surgery   Left breast lumpectomy showed grade 2 invasive lobular carcinoma, lobular carcinoma in situ showing ductal involvement, invasive tumor focally present in the lateral margin, additional margins very close.  Tumor size greater than 5 cm. Changes consistent with prior biopsies.  Fibrocystic changes including extensive stromal fibrosis and adenosis.  Left axillary lymph node also confirmed metastatic lobular carcinoma.  Final pathologic staging was PT3PN1A   09/11/2021 Surgery      Age at first child birth : 19  Nichole [name] is here for a follow-up after adjuvant radiation.  She tolerated it well except for some skin desquamation.  She is willing to try antiestrogen therapy with ovarian suppression as we have discussed before.  She did not want any adjuvant chemotherapy.  Rest of the pertinent 10 point ROS reviewed and negative   MEDICAL HISTORY:  Past  Medical History:  Diagnosis Date   Asthma    exercise induced asthma   Cancer (HCC) 07/29/2021   left breast   Family history of breast cancer 08/14/2021   Family  history of kidney cancer 08/14/2021   Pre-diabetes     SURGICAL HISTORY: Past Surgical History:  Procedure Laterality Date   BIOPSY  03/04/2021   Procedure: BIOPSY;  Surgeon: Quentin Ore, MD;  Location: [facility];  Service: General;;   BREAST BIOPSY Left 07/30/2021   BREAST BIOPSY Right 09/01/2021   BREAST BIOPSY Left 09/05/2021   BREAST LUMPECTOMY WITH RADIOACTIVE SEED AND SENTINEL LYMPH NODE BIOPSY Left 09/11/2021   Procedure: LEFT BREAST LUMPECTOMY WITH RADIOACTIVE SEED X3 AND LEFT SENTINEL LYMPH NODE BIOPSY;  Surgeon: Harriette Bouillon, MD;  Location: [facility];  Service: General;  Laterality: Left;   ESOPHAGOGASTRODUODENOSCOPY N/A 03/04/2021   Procedure: ESOPHAGOGASTRODUODENOSCOPY (EGD);  Surgeon: Quentin Ore, MD;  Location: [facility];  Service: General;  Laterality: N/A;   LAPAROSCOPIC GASTRIC SLEEVE RESECTION N/A 05/02/2021   Procedure: LAPAROSCOPIC GASTRIC SLEEVE RESECTION;  Surgeon: Quentin Ore, MD;  Location: [facility];  Service: General;  Laterality: N/A;   LAPAROSCOPIC VAGINAL HYSTERECTOMY WITH SALPINGECTOMY N/A 10/08/2016   Procedure: LAPAROSCOPIC ASSISTED VAGINAL HYSTERECTOMY WITH RIGHT SALPINGECTOMY LYSIS OF ADHESIONS;  Surgeon: Reva Bores, MD;  Location: [facility];  Service: Gynecology;  Laterality: N/A;   MYOMECTOMY  2014   RE-EXCISION OF BREAST LUMPECTOMY Left 09/24/2021   Procedure: RE-EXCISION OF LEFT BREAST LUMPECTOMY;  Surgeon: Harriette Bouillon, MD;  Location: [facility];  Service: General;  Laterality: Left;   Right Oophrectomy     UPPER GI ENDOSCOPY N/A 05/02/2021   Procedure: UPPER GI ENDOSCOPY;  Surgeon: Quentin Ore, MD;  Location: [facility];  Service: General;  Laterality: N/A;   WISDOM TOOTH EXTRACTION      SOCIAL HISTORY: Social History   Socioeconomic History   Marital status: Divorced    Spouse name: Not on file   Number of children: 1   Years of education: Not on file   Highest education level: Not on file   Occupational History   Not on file  Tobacco Use   Smoking status: Former    Packs/day: 0.10    Years: 15.00    Total pack years: 1.50    Types: Cigarettes    Quit date: 01/23/2017    Years since quitting: 4.9   Smokeless tobacco: Never  Vaping Use   Vaping Use: Never used  Substance and Sexual Activity   Alcohol use: Not Currently    Comment: none in last year per pt   Drug use: Not Currently   Sexual activity: Not Currently    Birth control/protection: None  Other Topics Concern   Not on file  Social History Narrative   Work as a Engineer, 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   Has 53 year old son.   Moved from [place] in Sept. 2015   Social Determinants of Health   Financial Resource Strain: Not on file  Food Insecurity: Not on file  Transportation Needs: Not on file  Physical Activity: Not on file  Stress: Not on file  Social Connections: Not on file  Intimate Partner Violence: Not on file    FAMILY HISTORY: Family History  Problem Relation Age of Onset   Arthritis Mother    Breast cancer Mother 10       Lumpectomy   Hyperlipidemia Mother  Hypertension Mother    Kidney cancer Mother 49       Had ablation   Graves' disease Mother        Had thyroid removed   Graves' disease Brother        Deceased    ALLERGIES:  has No Known Allergies.  MEDICATIONS:  Current Outpatient Medications  Medication Sig Dispense Refill   tamoxifen (NOLVADEX) 20 MG tablet Take 1 tablet (20 mg total) by mouth daily. 90 tablet 3   albuterol (VENTOLIN HFA) 108 (90 Base) MCG/ACT inhaler Inhale 1-2 puffs into the lungs every 6 (six) hours as needed for wheezing or shortness of breath. 1 each 0   CALCIUM PO Take 1 tablet by mouth 3 (three) times daily.     cetirizine (ZYRTEC) 10 MG tablet Take 1 tablet (10 mg total) by mouth at bedtime. For allergies 90 tablet 0   fluticasone (FLONASE) 50 MCG/ACT nasal spray Place 1 spray into both nostrils 2 (two) times daily  as needed for allergies or rhinitis. 48 mL 0   ibuprofen (ADVIL) 800 MG tablet Take 1 tablet (800 mg total) by mouth every 8 (eight) hours as needed. 30 tablet 0   linaclotide (LINZESS) 72 MCG capsule Take 72 mcg by mouth daily as needed (constipation).     Multiple Vitamin (MULTI-VITAMIN) tablet Take 1 tablet by mouth daily.     oxyCODONE (OXY IR/ROXICODONE) 5 MG immediate release tablet Take 1 tablet (5 mg total) by mouth every 6 (six) hours as needed for severe pain. 15 tablet 0   oxyCODONE (OXY IR/ROXICODONE) 5 MG immediate release tablet Take 1 tablet (5 mg total) by mouth every 6 (six) hours as needed for severe pain. 15 tablet 0   No current facility-administered medications for this visit.   REVIEW OF SYSTEMS:   Constitutional: Denies fevers, chills or abnormal night sweats Eyes: Denies blurriness of vision, double vision or watery eyes Ears, nose, mouth, throat, and face: Denies mucositis or sore throat Respiratory: Denies cough, dyspnea or wheezes Cardiovascular: Denies palpitation, chest discomfort or lower extremity swelling Gastrointestinal:  Denies nausea, heartburn or change in bowel habits Skin: Denies abnormal skin rashes Lymphatics: Denies new lymphadenopathy or easy bruising Neurological:Denies numbness, tingling or new weaknesses Behavioral/Psych: Mood is stable, no new changes  Breast: She felt palpable breast mass in December All other systems were reviewed with the patient and are negative.  PHYSICAL EXAMINATION: ECOG PERFORMANCE STATUS: 0 - Asymptomatic  Vitals:   12/23/21 1316  BP: 129/72  Pulse: 76  Resp: 16  Temp: 98.6 F (37 C)  SpO2: 100%     Filed Weights   12/23/21 1316  Weight: 211 lb 14.4 oz (96.1 kg)     BP 129/72 (BP Location: Right Arm, Patient Position: Sitting)   Pulse 76   Temp 98.6 F (37 C) (Temporal)   Resp 16   Ht _0  (1.676 m)   Wt 211 lb 14.4 oz (96.1 kg)   LMP 09/09/2016 (Approximate)   SpO2 100%   BMI 34.20 kg/m    Physical Exam Constitutional:      Appearance: Normal appearance.  Neurological:     Mental Status: She is alert.      LABORATORY DATA:  I have reviewed the data as listed Lab Results  Component Value Date   WBC 4.1 10/27/2021   HGB 11.9 (L) 10/27/2021   HCT 35.8 (L) 10/27/2021   MCV 91.6 10/27/2021   PLT 249 10/27/2021   Lab Results  Component  Value Date   NA 138 08/13/2021   K 3.7 08/13/2021   CL 107 08/13/2021   CO2 25 08/13/2021    RADIOGRAPHIC STUDIES: I have personally reviewed the radiological reports and agreed with the findings in the report.  ASSESSMENT AND PLAN:  Malignant neoplasm of overlapping sites of left breast in female, estrogen receptor positive ([place]) This is a very pleasant 46 year old female patient, premenopausal with family history of breast cancer in mom who noticed breast abnormality in her left breast in December point for further investigation.  She had a mammogram, diagnostic mammogram as well as ultrasound which noticed an abnormality at the 12 o'clock position in the left breast measuring around 20 mm in its largest dimension along with a satellite nodule in the same breast at around 11 o'clock position measuring 4 x 3 x 6 mm without any enlarged left axillary lymphadenopathy.  Biopsy from these masses showed invasive mammary carcinoma, grade 2, prognostics ER 50% moderate staining, PR 95% strong staining, KI less than 5% HER2 negative on the larger mass and similar prognostics on the second mass.    Given the intermediate grade, strong ER/PR positivity, low proliferation index and HER2 negative tumor, we discussed about upfront surgery followed by consideration for Oncotype testing. However on the final pathology, it was thought that all her tumor was indeed 1 large mass measuring at least 5 cm, 2 out of 4 sentinel lymph nodes involved with tumor.  Final pathologic staging was PT3N1A.  Prognostics not repeated on the final sample.  Reexcision  showed tumor measuring 2 mm.    We discussed about adjuvant chemotherapy on multiple occasions but patient refused.  She is now here after adjuvant radiation.  She is willing to try antiestrogen therapy along with ovarian suppression.  Given premenopausal status, we have discussed about tamoxifen with office versus aromatase inhibitors with the office.  I have reviewed the mechanism of action of each class of drugs, adverse effects with tamoxifen versus aromatase inhibitors.  She is willing to proceed with Zoladex along with tamoxifen.  She would like to do the Zoladex injections in [place] if possible.  We have once again discussed about adverse effects of tamoxifen including but not limited to postmenopausal symptoms, vaginal discharge, risk of DVT/PE, benefit on bone density along with improvement in breast cancer risk recurrence.  She will be scheduled for Zoladex injections in [place], nursing team has communicated with pharmacy and nursing team from [place].  She will start tamoxifen in 2 weeks and return to clinic in 3 months for follow-up.  She will continue diagnostic mammograms, and this will be due in February 2024. All her questions were answered to the best of my knowledge.  Thank you for consulting [place] the care of this patient.  Please do not hesitate to contact us with any additional questions or concerns.   Total time spent:40 minutes  Thank you for consulting [place] in the care of this patient.  Please not hesitate to contact us with any additional questions or concerns. All questions were answered. The patient knows to call the clinic with any problems, questions or concerns.    Benay Pike, MD 12/23/21

## 2021-12-23 NOTE — Assessment & Plan Note (Addendum)
This is a very pleasant 46 year old female patient, premenopausal with family history of breast cancer in mom who noticed breast abnormality in her left breast in December point for further investigation.  She had a mammogram, diagnostic mammogram as well as ultrasound which noticed an abnormality at the 12 o'clock position in the left breast measuring around 20 mm in its largest dimension along with a satellite nodule in the same breast at around 11 o'clock position measuring 4 x 3 x 6 mm without any enlarged left axillary lymphadenopathy.  Biopsy from these masses showed invasive mammary carcinoma, grade 2, prognostics ER 50% moderate staining, PR 95% strong staining, KI less than 5% HER2 negative on the larger mass and similar prognostics on the second mass.    Given the intermediate grade, strong ER/PR positivity, low proliferation index and HER2 negative tumor, we discussed about upfront surgery followed by consideration for Oncotype testing. However on the final pathology, it was thought that all her tumor was indeed 1 large mass measuring at least 5 cm, 2 out of 4 sentinel lymph nodes involved with tumor.  Final pathologic staging was PT3N1A.  Prognostics not repeated on the final sample.  Reexcision showed tumor measuring 2 mm.    We discussed about adjuvant chemotherapy on multiple occasions but patient refused.  She is now here after adjuvant radiation.  She is willing to try antiestrogen therapy along with ovarian suppression.  Given premenopausal status, we have discussed about tamoxifen with office versus aromatase inhibitors with the office.  I have reviewed the mechanism of action of each class of drugs, adverse effects with tamoxifen versus aromatase inhibitors.  She is willing to proceed with Zoladex along with tamoxifen.  She would like to do the Zoladex injections in [place] if possible.  We have once again discussed about adverse effects of tamoxifen including but not limited to  postmenopausal symptoms, vaginal discharge, risk of DVT/PE, benefit on bone density along with improvement in breast cancer risk recurrence.  She will be scheduled for Zoladex injections in [place], nursing team has communicated with pharmacy and nursing team from [place].  She will start tamoxifen in 2 weeks and return to clinic in 3 months for follow-up.  She will continue diagnostic mammograms, and this will be due in February 2024. All her questions were answered to the best of my knowledge.  Thank you for consulting [place] the care of this patient.  Please do not hesitate to contact us with any additional questions or concerns.

## 2021-12-26 ENCOUNTER — Telehealth: Payer: Self-pay | Admitting: Hematology and Oncology

## 2021-12-26 ENCOUNTER — Telehealth: Payer: Self-pay | Admitting: *Deleted

## 2021-12-26 NOTE — Telephone Encounter (Signed)
Spoke with the patient to check in on how her skin was healing since completion of radiation therapy 12/19/2021.  She reports her skin is healing very well, no more signs of moist desquamation.  She states it is still itchy but new skin is starting to form.  No concerns voiced at this time.  She was seen by Medical Oncology earlier this week and will start Zoladex injections.  She was informed she would receive another follow-up phone call from our office in about 3-4 weeks.  She was advised to give [place] a call with any concerns.  She verbalized understanding.  Gloriajean Dell. Leonie Green, BSN

## 2021-12-26 NOTE — Telephone Encounter (Signed)
Scheduled per 8/2 sch msg, called pt but voicemail full, will try to call again

## 2021-12-31 NOTE — Progress Notes (Signed)
                                                                                                                                                             Patient Name: Nichole [name] MRN: [id number] DOB: 11-09-1975 Referring Physician: Alma Friendly (Profile Not Attached) Date of Service: 12/19/2021 [facility], [place]                                                        End Of Treatment Note  Diagnoses: C50.812-Malignant neoplasm of overlapping sites of left female breast  Cancer Staging: Stage IB, pT2N1M0 grade 2, ER/PR positive invasive lobular carcinoma of the left breast  Intent: Curative  Radiation Treatment Dates: 11/03/2021 through 12/19/2021 Site Technique Total Dose (Gy) Dose per Fx (Gy) Completed Fx Beam Energies  Breast, Left: Breast_L 3D 50.4/50.4 1.8 28/28 6X  Breast, Left: Breast_L_SCLV 3D 50.4/50.4 1.8 28/28 6X, 10X  Breast, Left: Breast_L_Bst 3D 10/10 2 5/5 6X   Narrative: The patient tolerated radiation therapy relatively well. She developed fatigue and anticipated skin changes in the treatment field, but developed moist desquamation in the upper axilla and inframammary fold at the completion of treatment.   Plan: The patient will receive a call in about one month from the radiation oncology department. She will continue follow up with Dr. Chryl Heck as well.  ________________________________________________    Carola Rhine, [employer]

## 2022-01-05 ENCOUNTER — Encounter: Payer: Self-pay | Admitting: Hematology and Oncology

## 2022-01-05 ENCOUNTER — Other Ambulatory Visit: Payer: Self-pay | Admitting: Hematology and Oncology

## 2022-01-05 ENCOUNTER — Other Ambulatory Visit: Payer: Self-pay | Admitting: Surgery

## 2022-01-05 DIAGNOSIS — C50912 Malignant neoplasm of unspecified site of left female breast: Secondary | ICD-10-CM

## 2022-01-07 ENCOUNTER — Inpatient Hospital Stay: Payer: BC Managed Care – PPO

## 2022-01-07 DIAGNOSIS — C50812 Malignant neoplasm of overlapping sites of left female breast: Secondary | ICD-10-CM | POA: Diagnosis not present

## 2022-01-07 DIAGNOSIS — Z170 Estrogen receptor positive status [ER+]: Secondary | ICD-10-CM

## 2022-01-07 DIAGNOSIS — Z5111 Encounter for antineoplastic chemotherapy: Secondary | ICD-10-CM | POA: Diagnosis not present

## 2022-01-07 DIAGNOSIS — Z803 Family history of malignant neoplasm of breast: Secondary | ICD-10-CM | POA: Diagnosis not present

## 2022-01-07 DIAGNOSIS — Z8051 Family history of malignant neoplasm of kidney: Secondary | ICD-10-CM | POA: Diagnosis not present

## 2022-01-07 DIAGNOSIS — Z87891 Personal history of nicotine dependence: Secondary | ICD-10-CM | POA: Diagnosis not present

## 2022-01-07 MED ORDER — GOSERELIN ACETATE 3.6 MG ~~LOC~~ IMPL
3.6000 mg | DRUG_IMPLANT | Freq: Once | SUBCUTANEOUS | Status: AC
  Administered 2022-01-07: 3.6 mg via SUBCUTANEOUS
  Filled 2022-01-07: qty 3.6

## 2022-01-22 NOTE — Progress Notes (Signed)
  Radiation Oncology         [phone] ________________________________  Name: AERICA [name] MRN: [id number]  Date of Service: 02/02/2022  DOB: 08-14-1975  Post Treatment Telephone Note  Diagnosis:   Stage IB, pT2N1M0 grade 2, ER/PR positive invasive lobular carcinoma of the left breast  Intent: Curative  Radiation Treatment Dates: 11/03/2021 through 12/19/2021 Site Technique Total Dose (Gy) Dose per Fx (Gy) Completed Fx Beam Energies  Breast, Left: Breast_L 3D 50.4/50.4 1.8 28/28 6X  Breast, Left: Breast_L_SCLV 3D 50.4/50.4 1.8 28/28 6X, 10X  Breast, Left: Breast_L_Bst 3D 10/10 2 5/5 6X   Narrative: The patient tolerated radiation therapy relatively well. She developed fatigue and anticipated skin changes in the treatment field, but developed moist desquamation in the upper axilla and inframammary fold at the completion of treatment.    Impression/Plan: 1. Stage IB, pT2N1M0 grade 2, ER/PR positive invasive lobular carcinoma of the left breast  I was unable to reach the patient but left a voicemail and on the message, I discussed that we would be happy to continue to follow her as needed, but she will also continue to follow up with Dr. Chryl Heck in medical oncology. She was counseled to call if she had questions about skin care or measures to avoid sun exposure to this area.        Carola Rhine, PAC

## 2022-02-02 ENCOUNTER — Ambulatory Visit
Admission: RE | Admit: 2022-02-02 | Discharge: 2022-02-02 | Disposition: A | Discharge status: Home / Self Care | Payer: BC Managed Care – PPO | Source: Ambulatory Visit | Attending: Radiation Oncology | Admitting: Radiation Oncology

## 2022-02-02 DIAGNOSIS — C50812 Malignant neoplasm of overlapping sites of left female breast: Secondary | ICD-10-CM | POA: Insufficient documentation

## 2022-02-02 DIAGNOSIS — Z170 Estrogen receptor positive status [ER+]: Secondary | ICD-10-CM | POA: Insufficient documentation

## 2022-02-04 ENCOUNTER — Inpatient Hospital Stay: Payer: BC Managed Care – PPO | Attending: Hematology and Oncology

## 2022-02-04 DIAGNOSIS — C50812 Malignant neoplasm of overlapping sites of left female breast: Secondary | ICD-10-CM | POA: Insufficient documentation

## 2022-02-04 DIAGNOSIS — Z5111 Encounter for antineoplastic chemotherapy: Secondary | ICD-10-CM | POA: Insufficient documentation

## 2022-02-04 DIAGNOSIS — Z170 Estrogen receptor positive status [ER+]: Secondary | ICD-10-CM | POA: Insufficient documentation

## 2022-02-07 ENCOUNTER — Encounter: Payer: Self-pay | Admitting: Adult Health

## 2022-02-09 ENCOUNTER — Telehealth: Payer: Self-pay | Admitting: Hematology and Oncology

## 2022-02-09 NOTE — Telephone Encounter (Signed)
Called patient in regards to getting her injection appointments adjust at the [facility]. Patient is aware of the changes made to her upcoming appointments.

## 2022-02-13 ENCOUNTER — Inpatient Hospital Stay: Payer: BC Managed Care – PPO

## 2022-02-13 VITALS — BP 137/81 | HR 58 | Temp 98.8°F | Resp 18

## 2022-02-13 DIAGNOSIS — C50812 Malignant neoplasm of overlapping sites of left female breast: Secondary | ICD-10-CM

## 2022-02-13 DIAGNOSIS — Z170 Estrogen receptor positive status [ER+]: Secondary | ICD-10-CM | POA: Diagnosis not present

## 2022-02-13 DIAGNOSIS — Z5111 Encounter for antineoplastic chemotherapy: Secondary | ICD-10-CM | POA: Diagnosis not present

## 2022-02-13 MED ORDER — GOSERELIN ACETATE 3.6 MG ~~LOC~~ IMPL
3.6000 mg | DRUG_IMPLANT | Freq: Once | SUBCUTANEOUS | Status: AC
  Administered 2022-02-13: 3.6 mg via SUBCUTANEOUS
  Filled 2022-02-13: qty 3.6

## 2022-02-16 ENCOUNTER — Ambulatory Visit: Payer: BC Managed Care – PPO

## 2022-02-27 ENCOUNTER — Encounter: Payer: BC Managed Care – PPO | Admitting: Adult Health

## 2022-03-04 ENCOUNTER — Ambulatory Visit: Payer: BC Managed Care – PPO

## 2022-03-13 ENCOUNTER — Inpatient Hospital Stay: Payer: BC Managed Care – PPO | Attending: Hematology and Oncology

## 2022-03-13 DIAGNOSIS — Z5111 Encounter for antineoplastic chemotherapy: Secondary | ICD-10-CM | POA: Insufficient documentation

## 2022-03-13 DIAGNOSIS — Z170 Estrogen receptor positive status [ER+]: Secondary | ICD-10-CM | POA: Diagnosis not present

## 2022-03-13 DIAGNOSIS — C50812 Malignant neoplasm of overlapping sites of left female breast: Secondary | ICD-10-CM | POA: Diagnosis not present

## 2022-03-13 MED ORDER — GOSERELIN ACETATE 3.6 MG ~~LOC~~ IMPL
3.6000 mg | DRUG_IMPLANT | Freq: Once | SUBCUTANEOUS | Status: AC
  Administered 2022-03-13: 3.6 mg via SUBCUTANEOUS
  Filled 2022-03-13: qty 3.6

## 2022-03-16 ENCOUNTER — Ambulatory Visit: Payer: BC Managed Care – PPO | Attending: Surgery

## 2022-03-16 VITALS — Wt 212.0 lb

## 2022-03-16 DIAGNOSIS — Z483 Aftercare following surgery for neoplasm: Secondary | ICD-10-CM | POA: Insufficient documentation

## 2022-03-16 NOTE — Therapy (Signed)
OUTPATIENT PHYSICAL THERAPY SOZO SCREENING NOTE   Patient Name: Nichole [name] MRN: [id number] DOB:1975-05-28, 46 y.o., female Today's Date: 03/16/2022  PCP: Pleas Koch, NP REFERRING PROVIDER: Erroll Luna, MD   PT End of Session - 03/16/22 0911     Visit Number 1   # unchanged due to screen only   PT Start Time 0908    PT Stop Time 0913    PT Time Calculation (min) 5 min    Activity Tolerance Patient tolerated treatment well    Behavior During Therapy [facility] for tasks assessed/performed             Past Medical History:  Diagnosis Date   Asthma    exercise induced asthma   Cancer ([place]) 07/29/2021   left breast   Family history of breast cancer 08/14/2021   Family history of kidney cancer 08/14/2021   Pre-diabetes    Past Surgical History:  Procedure Laterality Date   BIOPSY  03/04/2021   Procedure: BIOPSY;  Surgeon: Felicie Morn, MD;  [facility]: [facility];  Service: General;;   BREAST BIOPSY Left 07/30/2021   BREAST BIOPSY Right 09/01/2021   BREAST BIOPSY Left 09/05/2021   BREAST LUMPECTOMY WITH RADIOACTIVE SEED AND SENTINEL LYMPH NODE BIOPSY Left 09/11/2021   Procedure: LEFT BREAST LUMPECTOMY WITH RADIOACTIVE SEED X3 AND LEFT SENTINEL LYMPH NODE BIOPSY;  Surgeon: Erroll Luna, MD;  Location: [place];  Service: General;  Laterality: Left;   ESOPHAGOGASTRODUODENOSCOPY N/A 03/04/2021   Procedure: ESOPHAGOGASTRODUODENOSCOPY (EGD);  Surgeon: Felicie Morn, MD;  Location: [facility];  Service: General;  Laterality: N/A;   LAPAROSCOPIC GASTRIC SLEEVE RESECTION N/A 05/02/2021   Procedure: LAPAROSCOPIC GASTRIC SLEEVE RESECTION;  Surgeon: Felicie Morn, MD;  [facility];  Service: General;  Laterality: N/A;   LAPAROSCOPIC VAGINAL HYSTERECTOMY WITH SALPINGECTOMY N/A 10/08/2016   Procedure: LAPAROSCOPIC ASSISTED VAGINAL HYSTERECTOMY WITH RIGHT SALPINGECTOMY LYSIS OF ADHESIONS;  Surgeon: Donnamae Jude, MD;  Location: [facility];  Service:  Gynecology;  Laterality: N/A;   MYOMECTOMY  2014   RE-EXCISION OF BREAST LUMPECTOMY Left 09/24/2021   Procedure: RE-EXCISION OF LEFT BREAST LUMPECTOMY;  Surgeon: Erroll Luna, MD;  Location: [place];  Service: General;  Laterality: Left;   Right Oophrectomy     UPPER GI ENDOSCOPY N/A 05/02/2021   Procedure: UPPER GI ENDOSCOPY;  Surgeon: Felicie Morn, MD;  [facility];  Service: General;  Laterality: N/A;   WISDOM TOOTH EXTRACTION     Patient Active Problem List   Diagnosis Date Noted   Genetic testing 08/29/2021   Family history of breast cancer 08/14/2021   Family history of kidney cancer 08/14/2021   Malignant neoplasm of overlapping sites of left breast in female, estrogen receptor positive ([place]) 08/11/2021   Moderate persistent asthma with exacerbation 02/28/2021   Bilateral lower extremity edema 11/21/2020   Chronic right-sided low back pain with right-sided sciatica 08/01/2020   Preventative health care 04/11/2018   Environmental and seasonal allergies 04/11/2018   Pain of left thumb 04/11/2018   Hyperlipidemia 04/11/2018   Lumbar radiculopathy 08/16/2014   Prediabetes 08/02/2014   Morbid obesity with BMI of 40.0-44.9, adult ([place]) 08/02/2014   Tobacco abuse 08/02/2014    REFERRING DIAG: left breast cancer at risk for lymphedema  THERAPY DIAG:  Aftercare following surgery for neoplasm  PERTINENT HISTORY: left lumpectomy and SLNB on 09/11/21 with Dr. Marlou Starks with 2/4 LN positive.  Had re-excision 09/24/21.  Is recommended to have chemo but does not want to.  Will have radiation for sure  PRECAUTIONS: left UE Lymphedema risk, None  SUBJECTIVE: Pt returns for her 3 month L-Dex screen.   PAIN:  Are you having pain? No  SOZO SCREENING: Patient was assessed today using the SOZO machine to determine the lymphedema index score. This was compared to her baseline score. It was determined that she is within the recommended range when compared to her  baseline and no further action is needed at this time. She will continue SOZO screenings. These are done every 3 months for 2 years post operatively followed by every 6 months for 2 years, and then annually.   L-DEX FLOWSHEETS - 03/16/22 0900       L-DEX LYMPHEDEMA SCREENING   Measurement Type Unilateral    L-DEX MEASUREMENT EXTREMITY Upper Extremity    POSITION  Standing    DOMINANT SIDE Right    At Risk Side Left    BASELINE SCORE (UNILATERAL) -3.8    L-DEX SCORE (UNILATERAL) 0.3    VALUE CHANGE (UNILAT) 4.1               Otelia Limes, PTA 03/16/2022, 9:13 AM

## 2022-03-29 NOTE — Progress Notes (Unsigned)
SURVIVORSHIP VISIT:   BRIEF ONCOLOGIC HISTORY:  Oncology History  Malignant neoplasm of overlapping sites of left breast in female, estrogen receptor positive ([place])  07/08/2021 Mammogram    Screening mammogram 07/08/2021 showed possible mass with distortion in the left breast. 07/21/2021 she had a left diagnostic mammogram which showed a suspicious left breast mass at 12 o'clock position suspicious satellite nodule left breast at 11 o'clock position Ultrasound same day showed a 16 x 20 x 12 mm irregular hypoechoic mass left breast at 12 o'clock position 4 cm from the nipple.  There is an adjacent satellite nodule within the left breast 11 o'clock position 3 cm from nipple measuring 4 x 3 x 6 mm.  No enlarged left axillary lymphadenopathy    07/30/2021 Pathology Results   Pathology from left breast mass 11:00, 3 cm from nipple showed invasive mammary carcinoma grade 2 prognostics ER 50% moderate staining, PR 95% strong staining, Ki-67 less than 5% and HER2 negative.  The second mass at 11:00 also showed similar prognostics and is grade 2.   08/11/2021 Initial Diagnosis   Malignant neoplasm of overlapping sites of left breast in female, estrogen receptor positive ([place])   08/11/2021 Cancer Staging   Staging form: Breast, AJCC 8th Edition - Pathologic: Stage IB (pT2, pN1, cM0, G2, ER+, PR+, HER2-) - Signed by Benay Pike, MD on 10/02/2021 Histologic grading system: 3 grade system   08/29/2021 Genetic Testing   Negative hereditary cancer genetic testing: no pathogenic variants detected in [facility] CancerNext-Expanded +RNAinsight Panel.  Report date is 08/29/2021.   The CancerNext-Expanded gene panel offered by [facility] and includes sequencing, rearrangement, and RNA analysis for the following 77 genes: AIP, ALK, APC, ATM, AXIN2, BAP1, BARD1, BLM, BMPR1A, BRCA1, BRCA2, BRIP1, CDC73, CDH1, CDK4, CDKN1B, CDKN2A, CHEK2, CTNNA1, DICER1, FANCC, FH, FLCN, GALNT12, KIF1B, LZTR1, MAX, MEN1, MET, MLH1, MSH2,  MSH3, MSH6, MUTYH, NBN, NF1, NF2, NTHL1, PALB2, PHOX2B, PMS2, POT1, PRKAR1A, PTCH1, PTEN, RAD51C, RAD51D, RB1, RECQL, RET, SDHA, SDHAF2, SDHB, SDHC, SDHD, SMAD4, SMARCA4, SMARCB1, SMARCE1, STK11, SUFU, TMEM127, TP53, TSC1, TSC2, VHL and XRCC2 (sequencing and deletion/duplication); EGFR, EGLN1, HOXB13, KIT, MITF, PDGFRA, POLD1, and POLE (sequencing only); EPCAM and GREM1 (deletion/duplication only).    09/11/2021 Surgery   Left breast lumpectomy showed grade 2 invasive lobular carcinoma, lobular carcinoma in situ showing ductal involvement, invasive tumor focally present in the lateral margin, additional margins very close.  Tumor size greater than 5 cm. Changes consistent with prior biopsies.  Fibrocystic changes including extensive stromal fibrosis and adenosis.  Left axillary lymph node also confirmed metastatic lobular carcinoma.  Final pathologic staging was [id number]   11/03/2021 - 12/19/2021 Radiation Therapy   Site Technique Total Dose (Gy) Dose per Fx (Gy) Completed Fx Beam Energies  Breast, Left: Breast_L 3D 50.4/50.4 1.8 28/28 6X  Breast, Left: Breast_L_SCLV 3D 50.4/50.4 1.8 28/28 6X, 10X  Breast, Left: Breast_L_Bst 3D 10/10 2 5/5 6X     12/2021 -  Anti-estrogen oral therapy   Tamoxifen + Zoladex     INTERVAL HISTORY:  Ms. Hlavaty to review her survivorship care plan detailing her treatment course for breast cancer, as well as monitoring long-term side effects of that treatment, education regarding health maintenance, screening, and overall wellness and health promotion.     Overall, Ms. Sullivant reports feeling moderately well.  She is experiencing hot flashes.  She began tamoxifen and zoladex around the same time mid August and notes the hot flashes began approximately 2-3 days afterward.  She is experiencing approximately 10-15  hot flashes a day.  She says that these drench her in sweat and she feels like she needs to go home and shower when they occur during the workday.  There is slight  improvement when she goes outside.   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  She wears a neck fan and has a fan for legs.    REVIEW OF SYSTEMS:  Review of Systems  Constitutional:  Negative for appetite change, chills, fatigue, fever and unexpected weight change.  HENT:   Negative for hearing loss, lump/mass and trouble swallowing.   Eyes:  Negative for eye problems and icterus.  Respiratory:  Negative for chest tightness, cough and shortness of breath.   Cardiovascular:  Negative for chest pain, leg swelling and palpitations.  Gastrointestinal:  Negative for abdominal distention, abdominal pain, constipation, diarrhea, nausea and vomiting.  Endocrine: Positive for hot flashes.  Genitourinary:  Negative for difficulty urinating.   Musculoskeletal:  Negative for arthralgias.  Skin:  Negative for itching and rash.  Neurological:  Negative for dizziness, extremity weakness, headaches and numbness.  Hematological:  Negative for adenopathy. Does not bruise/bleed easily.  Psychiatric/Behavioral:  Negative for depression. The patient is not nervous/anxious.    Breast: Denies any new nodularity, masses, tenderness, nipple changes, or nipple discharge.      ONCOLOGY TREATMENT TEAM:  1. Surgeon:  Dr. Brantley Stage at [facility] 2. Medical Oncologist: Dr. Chryl Heck  3. Radiation Oncologist: Dr. Lisbeth Renshaw    PAST MEDICAL/SURGICAL HISTORY:  Past Medical History:  Diagnosis Date   Asthma    exercise induced asthma   Cancer ([place]) 07/29/2021   left breast   Family history of breast cancer 08/14/2021   Family history of kidney cancer 08/14/2021   Pre-diabetes    Past Surgical History:  Procedure Laterality Date   BIOPSY  03/04/2021   Procedure: BIOPSY;  Surgeon: Felicie Morn, MD;  [facility];  Service: General;;   BREAST BIOPSY Left 07/30/2021   BREAST BIOPSY Right 09/01/2021   BREAST BIOPSY Left 09/05/2021   BREAST LUMPECTOMY WITH  RADIOACTIVE SEED AND SENTINEL LYMPH NODE BIOPSY Left 09/11/2021   Procedure: LEFT BREAST LUMPECTOMY WITH RADIOACTIVE SEED X3 AND LEFT SENTINEL LYMPH NODE BIOPSY;  Surgeon: Erroll Luna, MD;  Location: [facility];  Service: General;  Laterality: Left;   ESOPHAGOGASTRODUODENOSCOPY N/A 03/04/2021   Procedure: ESOPHAGOGASTRODUODENOSCOPY (EGD);  Surgeon: Felicie Morn, MD;  Location: [facility];  Service: General;  Laterality: N/A;   LAPAROSCOPIC GASTRIC SLEEVE RESECTION N/A 05/02/2021   Procedure: LAPAROSCOPIC GASTRIC SLEEVE RESECTION;  Surgeon: Felicie Morn, MD;  [facility];  Service: General;  Laterality: N/A;   LAPAROSCOPIC VAGINAL HYSTERECTOMY WITH SALPINGECTOMY N/A 10/08/2016   Procedure: LAPAROSCOPIC ASSISTED VAGINAL HYSTERECTOMY WITH RIGHT SALPINGECTOMY LYSIS OF ADHESIONS;  Surgeon: Donnamae Jude, MD;  Location: [facility];  Service: Gynecology;  Laterality: N/A;   MYOMECTOMY  2014   RE-EXCISION OF BREAST LUMPECTOMY Left 09/24/2021   Procedure: RE-EXCISION OF LEFT BREAST LUMPECTOMY;  Surgeon: Erroll Luna, MD;  Location: [place];  Service: General;  Laterality: Left;   Right Oophrectomy     UPPER GI ENDOSCOPY N/A 05/02/2021   Procedure: UPPER GI ENDOSCOPY;  Surgeon: Felicie Morn, MD;  [facility];  Service: General;  Laterality: N/A;   WISDOM TOOTH EXTRACTION       ALLERGIES:  No Known Allergies   CURRENT MEDICATIONS:  Outpatient Encounter Medications as of 03/30/2022  Medication Sig  albuterol (VENTOLIN HFA) 108 (90 Base) MCG/ACT inhaler Inhale 1-2 puffs into the lungs every 6 (six) hours as needed for wheezing or shortness of breath.   CALCIUM PO Take 1 tablet by mouth 3 (three) times daily.   cetirizine (ZYRTEC) 10 MG tablet Take 1 tablet (10 mg total) by mouth at bedtime. For allergies (Patient taking differently: Take 10 mg by mouth as needed. For allergies)   fluticasone (FLONASE) 50 MCG/ACT nasal spray Place 1 spray into both  nostrils 2 (two) times daily as needed for allergies or rhinitis.   linaclotide (LINZESS) 72 MCG capsule Take 72 mcg by mouth daily as needed (constipation).   Multiple Vitamin (MULTI-VITAMIN) tablet Take 1 tablet by mouth daily.   tamoxifen (NOLVADEX) 20 MG tablet Take 1 tablet (20 mg total) by mouth daily.   [DISCONTINUED] ibuprofen (ADVIL) 800 MG tablet Take 1 tablet (800 mg total) by mouth every 8 (eight) hours as needed.   No facility-administered encounter medications on file as of 03/30/2022.     ONCOLOGIC FAMILY HISTORY:  Family History  Problem Relation Age of Onset   Arthritis Mother    Breast cancer Mother 34       Lumpectomy   Hyperlipidemia Mother    Hypertension Mother    Kidney cancer Mother 68       Had ablation   Graves' disease Mother        Had thyroid removed   Graves' disease Brother        Deceased     SOCIAL HISTORY:  Social History   Socioeconomic History   Marital status: Divorced    Spouse name: Not on file   Number of children: 1   Years of education: Not on file   Highest education level: Not on file  Occupational History   Not on file  Tobacco Use   Smoking status: Former    Packs/day: 0.10    Years: 15.00    Total pack years: 1.50    Types: Cigarettes    Quit date: 01/23/2017    Years since quitting: 5.1   Smokeless tobacco: Never  Vaping Use   Vaping Use: Never used  Substance and Sexual Activity   Alcohol use: Not Currently    Comment: none in last year per pt   Drug use: Not Currently   Sexual activity: Not Currently    Birth control/protection: None  Other Topics Concern   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   Has 36 year old son.   Moved from [place] in Sept. 2015   Social Determinants of Health   Financial Resource Strain: Not on file  Food Insecurity: Not on file  Transportation Needs: Not on file  Physical Activity: Not on file   Stress: Not on file  Social Connections: Not on file  Intimate Partner Violence: Not on file     OBSERVATIONS/OBJECTIVE:  BP 137/86 (BP Location: Left Arm, Patient Position: Sitting)   Pulse 87   Temp (!) 97.5 F (36.4 C) (Temporal)   Resp 18   Wt 211 lb 7 oz (95.9 kg)   LMP 09/09/2016 (Approximate)   SpO2 100%   BMI 34.13 kg/m  GENERAL: Patient is a well appearing female in no acute distress HEENT:  Sclerae anicteric.  Oropharynx clear and moist. No ulcerations or evidence of oropharyngeal candidiasis. Neck is supple.  NODES:  No cervical, supraclavicular,  or axillary lymphadenopathy palpated.  BREAST EXAM:  Left breast s/p lumpectomy and radiation, no sign of local recurrence, right breast benign LUNGS:  Clear to auscultation bilaterally.  No wheezes or rhonchi. HEART:  Regular rate and rhythm. No murmur appreciated. ABDOMEN:  Soft, nontender.  Positive, normoactive bowel sounds. No organomegaly palpated. MSK:  No focal spinal tenderness to palpation. Full range of motion bilaterally in the upper extremities. EXTREMITIES:  No peripheral edema.   SKIN:  Clear with no obvious rashes or skin changes. No nail dyscrasia. NEURO:  Nonfocal. Well oriented.  Appropriate affect.  LABORATORY DATA:  None for this visit.  DIAGNOSTIC IMAGING:  None for this visit.      ASSESSMENT AND PLAN:  Ms.. Graybeal is a pleasant 46 y.o. female with Stage IB left breast invasive lobular carcinoma, ER+/PR+/HER2-, diagnosed in 07/2021, treated with lumpectomy, adjuvant radiation therapy, and anti-estrogen therapy with Zoladex and Tamoxifen beginning in 12/2021.  She presents to the Survivorship Clinic for our initial meeting and routine follow-up post-completion of treatment for breast cancer.    1. Stage IB left breast cancer:  Ms. Effertz is continuing to recover from definitive treatment for breast cancer. She will follow-up with her medical oncologist, Dr. Chryl Heck in March 2024, likely sooner with  history and physical exam per surveillance protocol.  She will continue her anti-estrogen therapy with tamoxifen see #2.  We reviewed that she is a candidate to discuss adjuvant Verzenio.  We will talk about this further at her follow-up in 2 to 3 weeks to make sure that her hot flashes are improving.  Her mammogram is due 06/2022. Today, a comprehensive survivorship care plan and treatment summary was reviewed with the patient today detailing her breast cancer diagnosis, treatment course, potential late/long-term effects of treatment, appropriate follow-up care with recommendations for the future, and patient education resources.  A copy of this summary, along with a letter will be sent to the patient's primary care provider via mail/fax/In Basket message after today's visit.    2. Hot flashes: These are impacting her quality of life and she is quite unhappy with these.  We discussed taking a brief holiday from tamoxifen for 2 weeks and then restarting at a lower dose for 2 weeks however she is very motivated to stay on the medicine and not stop it.  We then discussed the option of Effexor which she would like to try.  We discussed risks, benefits, and I gave her a handout about the Effexor for her to read over.  We will touch base in 2 to 3 weeks to see how she is doing, see if she needs a dose increase to the 75 mg and we will also talk about Verzenio at that time.  3. Bone health:  Given Ms. Langan's age/history of breast cancer and her current treatment regimen including ovarian suppression with Zoladex, she is at risk for bone demineralization.  Because of her estrogen deficiency I placed orders for her to undergo bone density testing when she undergoes her mammogram.  She was given education on specific activities to promote bone health.  4. Cancer screening:  Due to Ms. Robertshaw's history and her age, she should receive screening for skin cancers, colon cancer, and gynecologic cancers.  Never undergone  colonoscopy therefore I referred her to GI today to.  She has a history of longstanding constipation and I noted that on her referral as well since she is taking Linzess.  The information and recommendations are listed on the  patient's comprehensive care plan/treatment summary and were reviewed in detail with the patient.    5. Health maintenance and wellness promotion: Ms. Gatchel was encouraged to consume 5-7 servings of fruits and vegetables per day. We reviewed the "Nutrition Rainbow" handout.  She was also encouraged to engage in moderate to vigorous exercise for 30 minutes per day most days of the week. We discussed the [employer] fitness program, which is designed for cancer survivors to help them become more physically fit after cancer treatments.  She was instructed to limit her alcohol consumption and continue to abstain from tobacco use.     6. Support services/counseling: It is not uncommon for this period of the patient's cancer care trajectory to be one of many emotions and stressors.  She was given information regarding our available services and encouraged to contact me with any questions or for help enrolling in any of our support group/programs.    Follow up instructions:    -Return to cancer center 07/2022 for f/u with Dr. Chryl Heck -Telephone visit with [facility] in 2-3 weeks  -Mammogram due in 06/2022 -Bone density in 06/2022 -Follow up with surgery 10/2022 -She is welcome to return back to the Survivorship Clinic at any time; no additional follow-up needed at this time.  -Consider referral back to survivorship as a long-term survivor for continued surveillance  The patient was provided an opportunity to ask questions and all were answered. The patient agreed with the plan and demonstrated an understanding of the instructions.   Total encounter time:45 minutes*in face-to-face visit time, chart review, lab review, care coordination, order entry, and documentation of the encounter  time.    Wilber Bihari, NP 03/30/22 10:02 AM Medical Oncology and Hematology [facility] [place], [place] [address] Tel. [phone]    Fax. [phone]  *Total Encounter Time as defined by the Centers for Medicare and Medicaid Services includes, in addition to the face-to-face time of a patient visit (documented in the note above) non-face-to-face time: obtaining and reviewing outside history, ordering and reviewing medications, tests or procedures, care coordination (communications with other health care professionals or caregivers) and documentation in the medical record.

## 2022-03-30 ENCOUNTER — Encounter: Payer: Self-pay | Admitting: Adult Health

## 2022-03-30 ENCOUNTER — Inpatient Hospital Stay: Payer: BC Managed Care – PPO | Attending: Hematology and Oncology | Admitting: Adult Health

## 2022-03-30 ENCOUNTER — Ambulatory Visit: Payer: BC Managed Care – PPO | Admitting: Hematology and Oncology

## 2022-03-30 ENCOUNTER — Other Ambulatory Visit: Payer: Self-pay

## 2022-03-30 VITALS — BP 137/86 | HR 87 | Temp 97.5°F | Resp 18 | Wt 211.4 lb

## 2022-03-30 DIAGNOSIS — Z87891 Personal history of nicotine dependence: Secondary | ICD-10-CM | POA: Diagnosis not present

## 2022-03-30 DIAGNOSIS — Z1211 Encounter for screening for malignant neoplasm of colon: Secondary | ICD-10-CM | POA: Diagnosis not present

## 2022-03-30 DIAGNOSIS — Z90710 Acquired absence of both cervix and uterus: Secondary | ICD-10-CM | POA: Diagnosis not present

## 2022-03-30 DIAGNOSIS — R232 Flushing: Secondary | ICD-10-CM | POA: Diagnosis not present

## 2022-03-30 DIAGNOSIS — Z803 Family history of malignant neoplasm of breast: Secondary | ICD-10-CM | POA: Diagnosis not present

## 2022-03-30 DIAGNOSIS — E2839 Other primary ovarian failure: Secondary | ICD-10-CM

## 2022-03-30 DIAGNOSIS — C50812 Malignant neoplasm of overlapping sites of left female breast: Secondary | ICD-10-CM | POA: Insufficient documentation

## 2022-03-30 DIAGNOSIS — Z170 Estrogen receptor positive status [ER+]: Secondary | ICD-10-CM | POA: Diagnosis not present

## 2022-03-30 DIAGNOSIS — Z5111 Encounter for antineoplastic chemotherapy: Secondary | ICD-10-CM | POA: Diagnosis not present

## 2022-03-30 DIAGNOSIS — Z8051 Family history of malignant neoplasm of kidney: Secondary | ICD-10-CM | POA: Insufficient documentation

## 2022-03-30 DIAGNOSIS — K5909 Other constipation: Secondary | ICD-10-CM

## 2022-03-30 MED ORDER — VENLAFAXINE HCL ER 37.5 MG PO CP24: 37.5000 mg | ORAL_CAPSULE | Freq: Every day | ORAL | 0 refills | Status: DC

## 2022-03-30 NOTE — Patient Instructions (Signed)
Venlafaxine Extended-Release Capsules What is this medication? VENLAFAXINE (VEN la fax een) treats depression and anxiety. It increases the amount of serotonin and norepinephrine in the brain, hormones that help regulate mood. It belongs to a group of medications called SNRIs. This medicine may be used for other purposes; ask your health care provider or pharmacist if you have questions. COMMON BRAND NAME(S): Effexor XR What should I tell my care team before I take this medication? They need to know if you have any of these conditions: Bleeding disorders Glaucoma Heart disease High blood pressure High cholesterol Kidney disease Liver disease Low levels of sodium in the blood Mania or bipolar disorder Seizures Suicidal thoughts, plans, or attempt; a previous suicide attempt by you or a family Take medications that treat or prevent blood clots Thyroid disease An unusual or allergic reaction to venlafaxine, desvenlafaxine, other medications, foods, dyes, or preservatives Pregnant or trying to get pregnant Breast-feeding How should I use this medication? Take this medication by mouth with a full glass of water. Follow the directions on the prescription label. Do not cut, crush, or chew this medication. Take it with food. If needed, the capsule may be carefully opened and the entire contents sprinkled on a spoonful of cool applesauce. Swallow the applesauce/pellet mixture right away without chewing and follow with a glass of water to ensure complete swallowing of the pellets. Try to take your medication at about the same time each day. Do not take your medication more often than directed. Do not stop taking this medication suddenly except upon the advice of your care team. Stopping this medication too quickly may cause serious side effects or your condition may worsen. A special MedGuide will be given to you by the pharmacist with each prescription and refill. Be sure to read this information  carefully each time. Talk to your care team regarding the use of this medication in children. Special care may be needed. Overdosage: If you think you have taken too much of this medicine contact a poison control center or emergency room at once. NOTE: This medicine is only for you. Do not share this medicine with others. What if I miss a dose? If you miss a dose, take it as soon as you can. If it is almost time for your next dose, take only that dose. Do not take double or extra doses. What may interact with this medication? Do not take this medication with any of the following: Certain medications for fungal infections like fluconazole, itraconazole, ketoconazole, posaconazole, voriconazole Cisapride Desvenlafaxine Dronedarone Duloxetine Levomilnacipran Linezolid MAOIs like Carbex, Eldepryl, Marplan, Nardil, and Parnate Methylene blue (injected into a vein) Milnacipran Pimozide Thioridazine This medication may also interact with the following: Amphetamines Aspirin and aspirin-like medications Certain medications for depression, anxiety, or psychotic disturbances Certain medications for migraine headaches like almotriptan, eletriptan, frovatriptan, naratriptan, rizatriptan, sumatriptan, zolmitriptan Certain medications for sleep Certain medications that treat or prevent blood clots like dalteparin, enoxaparin, warfarin Cimetidine Clozapine Diuretics Fentanyl Furazolidone Indinavir Isoniazid Lithium Metoprolol NSAIDS, medications for pain and inflammation, like ibuprofen or naproxen Other medications that prolong the QT interval (cause an abnormal heart rhythm) like dofetilide, ziprasidone Procarbazine Rasagiline Supplements like St. John's wort, kava kava, valerian Tramadol Tryptophan This list may not describe all possible interactions. Give your health care provider a list of all the medicines, herbs, non-prescription drugs, or dietary supplements you use. Also tell them  if you smoke, drink alcohol, or use illegal drugs. Some items may interact with your medicine. What should   I watch for while using this medication? Tell your care team if your symptoms do not get better or if they get worse. Visit your care team for regular checks on your progress. Because it may take several weeks to see the full effects of this medication, it is important to continue your treatment as prescribed by your care team. Watch for new or worsening thoughts of suicide or depression. This includes sudden changes in mood, behaviors, or thoughts. These changes can happen at any time but are more common in the beginning of treatment or after a change in dose. Call your care team right away if you experience these thoughts or worsening depression. Manic episodes may happen in patients with bipolar disorder who take this medication. Watch for changes in feelings or behaviors such as feeling anxious, nervous, agitated, panicky, irritable, hostile, aggressive, impulsive, severely restless, overly excited and hyperactive, or trouble sleeping. These changes can happen at any time but are more common in the beginning of treatment or after a change in dose. Call your care team right away if you notice any of these symptoms. This medication can cause an increase in blood pressure. Check with your care team for instructions on monitoring your blood pressure while taking this medication. You may get drowsy or dizzy. Do not drive, use machinery, or do anything that needs mental alertness until you know how this medication affects you. Do not stand or sit up quickly, especially if you are an older patient. This reduces the risk of dizzy or fainting spells. Do not drink alcohol while taking this medication. Drinking alcohol may alter the effects of your medication. Serious side effects may occur. Your mouth may get dry. Chewing sugarless gum, sucking hard candy and drinking plenty of water will help. Contact your  care team if the problem does not go away or is severe. What side effects may I notice from receiving this medication? Side effects that you should report to your care team as soon as possible: Allergic reactions--skin rash, itching, hives, swelling of the face, lips, tongue, or throat Bleeding--bloody or black, tar-like stools, red or dark brown urine, vomiting blood or brown material that looks like coffee grounds, small, red or purple spots on skin, unusual bleeding or bruising Heart rhythm changes--fast or irregular heartbeat, dizziness, feeling faint or lightheaded, chest pain, trouble breathing Increase in blood pressure Loss of appetite with weight loss Low sodium level--muscle weakness, fatigue, dizziness, headache, confusion Serotonin syndrome--irritability, confusion, fast or irregular heartbeat, muscle stiffness, twitching muscles, sweating, high fever, seizures, chills, vomiting, diarrhea Sudden eye pain or change in vision such as blurry vision, seeing halos around lights, vision loss Thoughts of suicide or self-harm, worsening mood, feelings of depression Side effects that usually do not require medical attention (report to your care team if they continue or are bothersome): Anxiety, nervousness Change in sex drive or performance Dizziness Dry mouth Excessive sweating Nausea Tremors or shaking Trouble sleeping This list may not describe all possible side effects. Call your doctor for medical advice about side effects. You may report side effects to FDA at 1-800-FDA-1088. Where should I keep my medication? Keep out of the reach of children and pets. Store at a controlled temperature between 20 and 25 degrees C (68 degrees and 77 degrees F), in a dry place. Throw away any unused medication after the expiration date. NOTE: This sheet is a summary. It may not cover all possible information. If you have questions about this medicine, talk to your doctor,   pharmacist, or health care  provider.  2023 Elsevier/Gold Standard (2007-07-02 00:00:00)

## 2022-04-01 ENCOUNTER — Ambulatory Visit: Payer: BC Managed Care – PPO

## 2022-04-03 ENCOUNTER — Telehealth: Payer: Self-pay | Admitting: Adult Health

## 2022-04-03 NOTE — Telephone Encounter (Signed)
Scheduled appointment per 11/6 los. Patient is aware. Sent a high priority message to [facility] to get the patient scheduled for more injections at that campus per [facility].

## 2022-04-10 ENCOUNTER — Inpatient Hospital Stay: Payer: BC Managed Care – PPO

## 2022-04-10 DIAGNOSIS — C50812 Malignant neoplasm of overlapping sites of left female breast: Secondary | ICD-10-CM

## 2022-04-10 DIAGNOSIS — Z87891 Personal history of nicotine dependence: Secondary | ICD-10-CM | POA: Diagnosis not present

## 2022-04-10 DIAGNOSIS — Z170 Estrogen receptor positive status [ER+]: Secondary | ICD-10-CM | POA: Diagnosis not present

## 2022-04-10 DIAGNOSIS — Z803 Family history of malignant neoplasm of breast: Secondary | ICD-10-CM | POA: Diagnosis not present

## 2022-04-10 DIAGNOSIS — Z90710 Acquired absence of both cervix and uterus: Secondary | ICD-10-CM | POA: Diagnosis not present

## 2022-04-10 DIAGNOSIS — Z8051 Family history of malignant neoplasm of kidney: Secondary | ICD-10-CM | POA: Diagnosis not present

## 2022-04-10 DIAGNOSIS — R232 Flushing: Secondary | ICD-10-CM | POA: Diagnosis not present

## 2022-04-10 DIAGNOSIS — Z5111 Encounter for antineoplastic chemotherapy: Secondary | ICD-10-CM | POA: Diagnosis not present

## 2022-04-10 MED ORDER — GOSERELIN ACETATE 3.6 MG ~~LOC~~ IMPL
3.6000 mg | DRUG_IMPLANT | Freq: Once | SUBCUTANEOUS | Status: AC
  Administered 2022-04-10: 3.6 mg via SUBCUTANEOUS
  Filled 2022-04-10: qty 3.6

## 2022-04-14 ENCOUNTER — Inpatient Hospital Stay (HOSPITAL_BASED_OUTPATIENT_CLINIC_OR_DEPARTMENT_OTHER): Payer: BC Managed Care – PPO | Admitting: Adult Health

## 2022-04-14 ENCOUNTER — Encounter: Payer: Self-pay | Admitting: Adult Health

## 2022-04-14 DIAGNOSIS — Z170 Estrogen receptor positive status [ER+]: Secondary | ICD-10-CM

## 2022-04-14 DIAGNOSIS — C50812 Malignant neoplasm of overlapping sites of left female breast: Secondary | ICD-10-CM | POA: Diagnosis not present

## 2022-04-14 MED ORDER — VENLAFAXINE HCL ER 75 MG PO CP24: 75.0000 mg | ORAL_CAPSULE | Freq: Every day | ORAL | 3 refills | Status: DC

## 2022-04-14 NOTE — Progress Notes (Signed)
Cancer Center Cancer Follow up:    [name], [name], NP [address] [place] [address]   DIAGNOSIS:  Cancer Staging  Malignant neoplasm of overlapping sites of left breast in female, estrogen receptor positive (HCC) Staging form: Breast, AJCC 8th Edition - Pathologic: Stage IB (pT2, pN1, cM0, G2, ER+, PR+, HER2-) - Signed by Iruku, Praveena, MD on 10/02/2021 Histologic grading system: 3 grade system I connected with Briteny Venuto on 04/17/22 at  2:45 PM EST by telephone and verified that I am speaking with the correct person using two identifiers.  I discussed the limitations, risks, security and privacy concerns of performing an evaluation and management service by telephone and the availability of in person appointments.  I also discussed with the patient that there may be a patient responsible charge related to this service. The patient expressed understanding and agreed to proceed.  Patient location: home Provider location: [facility] office  SUMMARY OF ONCOLOGIC HISTORY: Oncology History  Malignant neoplasm of overlapping sites of left breast in female, estrogen receptor positive (HCC)  07/08/2021 Mammogram    Screening mammogram 07/08/2021 showed possible mass with distortion in the left breast. 07/21/2021 she had a left diagnostic mammogram which showed a suspicious left breast mass at 12 o'clock position suspicious satellite nodule left breast at 11 o'clock position Ultrasound same day showed a 16 x 20 x 12 mm irregular hypoechoic mass left breast at 12 o'clock position 4 cm from the nipple.  There is an adjacent satellite nodule within the left breast 11 o'clock position 3 cm from nipple measuring 4 x 3 x 6 mm.  No enlarged left axillary lymphadenopathy    07/30/2021 Pathology Results   Pathology from left breast mass 11:00, 3 cm from nipple showed invasive mammary carcinoma grade 2 prognostics ER 50% moderate staining, PR 95% strong staining, Ki-67 less than 5% and  HER2 negative.  The second mass at 11:00 also showed similar prognostics and is grade 2.   08/11/2021 Initial Diagnosis   Malignant neoplasm of overlapping sites of left breast in female, estrogen receptor positive (HCC)   08/11/2021 Cancer Staging   Staging form: Breast, AJCC 8th Edition - Pathologic: Stage IB (pT2, pN1, cM0, G2, ER+, PR+, HER2-) - Signed by Iruku, Praveena, MD on 10/02/2021 Histologic grading system: 3 grade system   08/29/2021 Genetic Testing   Negative hereditary cancer genetic testing: no pathogenic variants detected in [facility] CancerNext-Expanded +RNAinsight Panel.  Report date is 08/29/2021.   The CancerNext-Expanded gene panel offered by [facility] [employer] and includes sequencing, rearrangement, and RNA analysis for the following 77 genes: AIP, ALK, APC, ATM, AXIN2, BAP1, BARD1, BLM, BMPR1A, BRCA1, BRCA2, BRIP1, CDC73, CDH1, CDK4, CDKN1B, CDKN2A, CHEK2, CTNNA1, DICER1, FANCC, FH, FLCN, GALNT12, KIF1B, LZTR1, MAX, MEN1, MET, MLH1, MSH2, MSH3, MSH6, MUTYH, NBN, NF1, NF2, NTHL1, PALB2, PHOX2B, PMS2, POT1, PRKAR1A, PTCH1, PTEN, RAD51C, RAD51D, RB1, RECQL, RET, SDHA, SDHAF2, SDHB, SDHC, SDHD, SMAD4, SMARCA4, SMARCB1, SMARCE1, STK11, SUFU, TMEM127, TP53, TSC1, TSC2, VHL and XRCC2 (sequencing and deletion/duplication); EGFR, EGLN1, HOXB13, KIT, MITF, PDGFRA, POLD1, and POLE (sequencing only); EPCAM and GREM1 (deletion/duplication only).    09/11/2021 Surgery   Left breast lumpectomy showed grade 2 invasive lobular carcinoma, lobular carcinoma in situ showing ductal involvement, invasive tumor focally present in the lateral margin, additional margins very close.  Tumor size greater than 5 cm. Changes consistent with prior biopsies.  Fibrocystic changes including extensive stromal fibrosis and adenosis.  Left axillary lymph node also confirmed metastatic lobular carcinoma.    Final pathologic staging was [id number]   11/03/2021 - 12/19/2021 Radiation Therapy   Site Technique Total Dose (Gy) Dose per Fx  (Gy) Completed Fx Beam Energies  Breast, Left: Breast_L 3D 50.4/50.4 1.8 28/28 6X  Breast, Left: Breast_L_SCLV 3D 50.4/50.4 1.8 28/28 6X, 10X  Breast, Left: Breast_L_Bst 3D 10/10 2 5/5 6X     12/2021 -  Anti-estrogen oral therapy   Tamoxifen + Zoladex     CURRENT THERAPY: Tamoxifen/Zoladex  INTERVAL HISTORY: Nichole [name] 46 y.o. female returns for f/u of her breast cancer on Tamoxifen and Zoladex.  She started Effexor a couple of weeks ago.  She has noticed an improvement in her mood and was down to 2-4 hot flashes per day, she had a shot last week and the hot flashes have increased back to 10 per day.     Patient Active Problem List   Diagnosis Date Noted   Genetic testing 08/29/2021   Family history of breast cancer 08/14/2021   Family history of kidney cancer 08/14/2021   Malignant neoplasm of overlapping sites of left breast in female, estrogen receptor positive (HCC) 08/11/2021   Moderate persistent asthma with exacerbation 02/28/2021   Bilateral lower extremity edema 11/21/2020   Chronic right-sided low back pain with right-sided sciatica 08/01/2020   Preventative health care 04/11/2018   Environmental and seasonal allergies 04/11/2018   Pain of left thumb 04/11/2018   Hyperlipidemia 04/11/2018   Lumbar radiculopathy 08/16/2014   Prediabetes 08/02/2014   Morbid obesity with BMI of 40.0-44.9, adult (HCC) 08/02/2014   Tobacco abuse 08/02/2014    has No Known Allergies.  MEDICAL HISTORY: Past Medical History:  Diagnosis Date   Asthma    exercise induced asthma   Cancer (HCC) 07/29/2021   left breast   Family history of breast cancer 08/14/2021   Family history of kidney cancer 08/14/2021   Pre-diabetes     SURGICAL HISTORY: Past Surgical History:  Procedure Laterality Date   BIOPSY  03/04/2021   Procedure: BIOPSY;  Surgeon: Stechschulte, Paul J, MD;  Location: [facility];  Service: General;;   BREAST BIOPSY Left 07/30/2021   BREAST BIOPSY Right  09/01/2021   BREAST BIOPSY Left 09/05/2021   BREAST LUMPECTOMY WITH RADIOACTIVE SEED AND SENTINEL LYMPH NODE BIOPSY Left 09/11/2021   Procedure: LEFT BREAST LUMPECTOMY WITH RADIOACTIVE SEED X3 AND LEFT SENTINEL LYMPH NODE BIOPSY;  Surgeon: Cornett, Thomas, MD;  Location: [facility];  Service: General;  Laterality: Left;   ESOPHAGOGASTRODUODENOSCOPY N/A 03/04/2021   Procedure: ESOPHAGOGASTRODUODENOSCOPY (EGD);  Surgeon: Stechschulte, Paul J, MD;  Location: [facility];  Service: General;  Laterality: N/A;   LAPAROSCOPIC GASTRIC SLEEVE RESECTION N/A 05/02/2021   Procedure: LAPAROSCOPIC GASTRIC SLEEVE RESECTION;  Surgeon: Stechschulte, Paul J, MD;  Location: [facility];  Service: General;  Laterality: N/A;   LAPAROSCOPIC VAGINAL HYSTERECTOMY WITH SALPINGECTOMY N/A 10/08/2016   Procedure: LAPAROSCOPIC ASSISTED VAGINAL HYSTERECTOMY WITH RIGHT SALPINGECTOMY LYSIS OF ADHESIONS;  Surgeon: Pratt, Tanya S, MD;  Location: [facility];  Service: Gynecology;  Laterality: N/A;   MYOMECTOMY  2014   RE-EXCISION OF BREAST LUMPECTOMY Left 09/24/2021   Procedure: RE-EXCISION OF LEFT BREAST LUMPECTOMY;  Surgeon: Cornett, Thomas, MD;  Location: [facility];  Service: General;  Laterality: Left;   Right Oophrectomy     UPPER GI ENDOSCOPY N/A 05/02/2021   Procedure: UPPER GI ENDOSCOPY;  Surgeon: Stechschulte, Paul J, MD;  Location: [facility];  Service: General;  Laterality: N/A;   WISDOM TOOTH EXTRACTION      SOCIAL HISTORY:   Social History   Socioeconomic History   Marital status: Divorced    Spouse name: Not on file   Number of children: 1   Years of education: Not on file   Highest education level: Not on file  Occupational History   Not on file  Tobacco Use   Smoking status: Former    Packs/day: 0.10    Years: 15.00    Total pack years: 1.50    Types: Cigarettes    Quit date: 01/23/2017    Years since quitting: 5.2   Smokeless tobacco: Never  Vaping Use   Vaping Use: Never used  Substance and Sexual  Activity   Alcohol use: Not Currently    Comment: none in last year per pt   Drug use: Not Currently   Sexual activity: Not Currently    Birth control/protection: None  Other Topics Concern   Not on file  Social History Narrative   Work as a nurse at [employer].   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   Has 16 year old son.   Moved from [place] in Sept. 2015   Social Determinants of Health   Financial Resource Strain: Not on file  Food Insecurity: Not on file  Transportation Needs: Not on file  Physical Activity: Not on file  Stress: Not on file  Social Connections: Not on file  Intimate Partner Violence: Not on file    FAMILY HISTORY: Family History  Problem Relation Age of Onset   Arthritis Mother    Breast cancer Mother 55       Lumpectomy   Hyperlipidemia Mother    Hypertension Mother    Kidney cancer Mother 55       Had ablation   Graves' disease Mother        Had thyroid removed   Graves' disease Brother        Deceased    Review of Systems  Constitutional:  Negative for appetite change, chills, fatigue, fever and unexpected weight change.  HENT:   Negative for hearing loss, lump/mass and trouble swallowing.   Eyes:  Negative for eye problems and icterus.  Respiratory:  Negative for chest tightness, cough and shortness of breath.   Cardiovascular:  Negative for chest pain, leg swelling and palpitations.  Gastrointestinal:  Negative for abdominal distention, abdominal pain, constipation, diarrhea, nausea and vomiting.  Endocrine: Positive for hot flashes.  Genitourinary:  Negative for difficulty urinating.   Musculoskeletal:  Negative for arthralgias.  Skin:  Negative for itching and rash.  Neurological:  Negative for dizziness, extremity weakness, headaches and numbness.  Hematological:  Negative for adenopathy. Does not bruise/bleed easily.  Psychiatric/Behavioral:  Negative for depression. The patient is not nervous/anxious.       PHYSICAL  EXAMINATION  Patient sounds well.  She is in no apparent distress, mood and behavior normal speech is normal.  LABORATORY DATA: None for this visit   ASSESSMENT and THERAPY PLAN:   Malignant neoplasm of overlapping sites of left breast in female, estrogen receptor positive (HCC) Nichole [name] is a 46-year-old woman with history of stage Ib ER/PR positive breast cancer diagnosed in February 2023 status post lumpectomy, adjuvant radiation, and antiestrogen therapy with tamoxifen and Zoladex beginning in August 2023.  She will continue on tamoxifen daily and Zoladex every 4 weeks.  We discussed her hot flashes and they have improved tremendously but did slightly worsen over the past few days.  I recommended that she increase her dose of Effexor up to   75 mg daily.  As she is feeling much better she will follow-up with Dr. Iruku in 2 weeks for labs and follow-up to discuss the possibility of adjuvant Verzenio.    Follow up instructions:    -Return to cancer center 2 weeks  The patient was provided an opportunity to ask questions and all were answered. The patient agreed with the plan and demonstrated an understanding of the instructions.   The patient was advised to call back or seek an in-person evaluation if the symptoms worsen or if the condition fails to improve as anticipated.   I provided 15 minutes of non face-to-face telephone visit time during this encounter, and > 50% was spent counseling as documented under my assessment & plan.  Lindsey Causey, NP 04/17/22 2:47 PM Medical Oncology and Hematology [facility] [address] [place], [place] [address] Tel. [phone]    Fax. [phone]  

## 2022-04-17 ENCOUNTER — Encounter: Payer: Self-pay | Admitting: Hematology and Oncology

## 2022-04-17 NOTE — Assessment & Plan Note (Signed)
Nichole [name] is a 46 year old woman with history of stage Ib ER/PR positive breast cancer diagnosed in February 2023 status post lumpectomy, adjuvant radiation, and antiestrogen therapy with tamoxifen and Zoladex beginning in August 2023.  She will continue on tamoxifen daily and Zoladex every 4 weeks.  We discussed her hot flashes and they have improved tremendously but did slightly worsen over the past few days.  I recommended that she increase her dose of Effexor up to 75 mg daily.  As she is feeling much better she will follow-up with Dr. Chryl Heck in 2 weeks for labs and follow-up to discuss the possibility of adjuvant Verzenio.

## 2022-04-28 ENCOUNTER — Inpatient Hospital Stay: Payer: BC Managed Care – PPO | Attending: Hematology and Oncology | Admitting: Hematology and Oncology

## 2022-04-28 ENCOUNTER — Telehealth: Payer: Self-pay | Admitting: Pharmacy Technician

## 2022-04-28 ENCOUNTER — Encounter: Payer: Self-pay | Admitting: Hematology and Oncology

## 2022-04-28 ENCOUNTER — Other Ambulatory Visit (HOSPITAL_COMMUNITY): Payer: Self-pay

## 2022-04-28 ENCOUNTER — Telehealth: Payer: Self-pay

## 2022-04-28 VITALS — BP 144/57 | HR 78 | Temp 97.7°F | Resp 16 | Ht 66.0 in | Wt 204.5 lb

## 2022-04-28 DIAGNOSIS — Z5111 Encounter for antineoplastic chemotherapy: Secondary | ICD-10-CM | POA: Diagnosis not present

## 2022-04-28 DIAGNOSIS — Z170 Estrogen receptor positive status [ER+]: Secondary | ICD-10-CM | POA: Diagnosis not present

## 2022-04-28 DIAGNOSIS — Z87891 Personal history of nicotine dependence: Secondary | ICD-10-CM | POA: Insufficient documentation

## 2022-04-28 DIAGNOSIS — Z803 Family history of malignant neoplasm of breast: Secondary | ICD-10-CM | POA: Insufficient documentation

## 2022-04-28 DIAGNOSIS — Z8051 Family history of malignant neoplasm of kidney: Secondary | ICD-10-CM | POA: Diagnosis not present

## 2022-04-28 DIAGNOSIS — C50812 Malignant neoplasm of overlapping sites of left female breast: Secondary | ICD-10-CM | POA: Diagnosis not present

## 2022-04-28 DIAGNOSIS — Z79810 Long term (current) use of selective estrogen receptor modulators (SERMs): Secondary | ICD-10-CM | POA: Insufficient documentation

## 2022-04-28 MED ORDER — ABEMACICLIB 100 MG PO TABS
100.0000 mg | ORAL_TABLET | Freq: Two times a day (BID) | ORAL | 1 refills | Status: DC
  Filled 2022-04-28: qty 56, 28d supply, fill #0

## 2022-04-28 NOTE — Telephone Encounter (Signed)
Oral Oncology Pharmacist Encounter  Received new prescription for abemaciclib (Verzenio) for the treatment of early stage, HR positive, HER2 negative breast cancer in conjunction with tamoxifen, planned duration for 2 years.   Labs from 10/27/21 (CBC) assessed, no interventions needed. Per MD, okay to begin with CMP from 08/13/21. Will get monthly labs drawn with MD. Prescription dose and frequency assessed.   Current medication list in Epic reviewed, no significant DDIs with Verzenio identified.  Evaluated chart and no patient barriers to medication adherence noted.   Patient agreement for treatment documented in MD note on 04/28/2022.  Prescription has been e-scribed to the [facility] for benefits analysis and approval.  Oral Oncology Clinic will continue to follow for insurance authorization, copayment issues, initial counseling and start date.  Drema Halon, PharmD Hematology/Oncology Clinical Pharmacist [facility] [phone] 04/28/2022 3:40 PM

## 2022-04-28 NOTE — Telephone Encounter (Signed)
Oral Oncology Patient Advocate Encounter   Received notification that prior authorization for Verzenio is required.   PA submitted on 04/28/22 Key [id number] Status is pending     Nichole [name], CPhT-Adv Oncology Pharmacy Patient [place] Direct Number: [phone]  Fax: [phone]

## 2022-04-28 NOTE — Assessment & Plan Note (Signed)
This is a very pleasant 46 year old female patient, premenopausal with family history of breast cancer in mom who noticed breast abnormality in her left breast in December point for further investigation.  She had a mammogram, diagnostic mammogram as well as ultrasound which noticed an abnormality at the 12 o'clock position in the left breast measuring around 20 mm in its largest dimension along with a satellite nodule in the same breast at around 11 o'clock position measuring 4 x 3 x 6 mm without any enlarged left axillary lymphadenopathy.  Biopsy from these masses showed invasive mammary carcinoma, grade 2, prognostics ER 50% moderate staining, PR 95% strong staining, KI less than 5% HER2 negative on the larger mass and similar prognostics on the second mass.    Given the intermediate grade, strong ER/PR positivity, low proliferation index and HER2 negative tumor, we discussed about upfront surgery followed by consideration for Oncotype testing. However on the final pathology, it was thought that all her tumor was indeed 1 large mass measuring at least 5 cm, 2 out of 4 sentinel lymph nodes involved with tumor.  Final pathologic staging was PT3N1A.  Prognostics not repeated on the final sample.  Reexcision showed tumor measuring 2 mm.    She will continue diagnostic mammograms, and this will be due in February 2024. All her questions were answered to the best of my knowledge.  Thank you for consulting [place] the care of this patient.  Please do not hesitate to contact us with any additional questions or concerns.

## 2022-04-28 NOTE — Progress Notes (Unsigned)
[name] CONSULT NOTE  Patient Care Team: Pleas Koch, NP as PCP - General (Nurse Practitioner) Benay Pike, MD as Consulting Physician (Hematology and Oncology) Kyung Rudd, MD as Consulting Physician (Radiation Oncology) Erroll Luna, MD as Consulting Physician (General Surgery)  CHIEF COMPLAINTS/PURPOSE OF CONSULTATION:  Newly diagnosed breast cancer  HISTORY OF PRESENTING ILLNESS:   Nichole [name] 46 y.o. female is here because of recent diagnosis of left breast IDC  SUMMARY OF ONCOLOGIC HISTORY: Oncology History  Malignant neoplasm of overlapping sites of left breast in female, estrogen receptor positive ([place])  07/08/2021 Mammogram    Screening mammogram 07/08/2021 showed possible mass with distortion in the left breast. 07/21/2021 she had a left diagnostic mammogram which showed a suspicious left breast mass at 12 o'clock position suspicious satellite nodule left breast at 11 o'clock position Ultrasound same day showed a 16 x 20 x 12 mm irregular hypoechoic mass left breast at 12 o'clock position 4 cm from the nipple.  There is an adjacent satellite nodule within the left breast 11 o'clock position 3 cm from nipple measuring 4 x 3 x 6 mm.  No enlarged left axillary lymphadenopathy    07/30/2021 Pathology Results   Pathology from left breast mass 11:00, 3 cm from nipple showed invasive mammary carcinoma grade 2 prognostics ER 50% moderate staining, PR 95% strong staining, Ki-67 less than 5% and HER2 negative.  The second mass at 11:00 also showed similar prognostics and is grade 2.   08/11/2021 Initial Diagnosis   Malignant neoplasm of overlapping sites of left breast in female, estrogen receptor positive ([place])   08/11/2021 Cancer Staging   Staging form: Breast, AJCC 8th Edition - Pathologic: Stage IB (pT2, pN1, cM0, G2, ER+, PR+, HER2-) - Signed by Benay Pike, MD on 10/02/2021 Histologic grading system: 3 grade system   08/29/2021 Genetic  Testing   Negative hereditary cancer genetic testing: no pathogenic variants detected in [facility] CancerNext-Expanded +RNAinsight Panel.  Report date is 08/29/2021.   The CancerNext-Expanded gene panel offered by [facility] and includes sequencing, rearrangement, and RNA analysis for the following 77 genes: AIP, ALK, APC, ATM, AXIN2, BAP1, BARD1, BLM, BMPR1A, BRCA1, BRCA2, BRIP1, CDC73, CDH1, CDK4, CDKN1B, CDKN2A, CHEK2, CTNNA1, DICER1, FANCC, FH, FLCN, GALNT12, KIF1B, LZTR1, MAX, MEN1, MET, MLH1, MSH2, MSH3, MSH6, MUTYH, NBN, NF1, NF2, NTHL1, PALB2, PHOX2B, PMS2, POT1, PRKAR1A, PTCH1, PTEN, RAD51C, RAD51D, RB1, RECQL, RET, SDHA, SDHAF2, SDHB, SDHC, SDHD, SMAD4, SMARCA4, SMARCB1, SMARCE1, STK11, SUFU, TMEM127, TP53, TSC1, TSC2, VHL and XRCC2 (sequencing and deletion/duplication); EGFR, EGLN1, HOXB13, KIT, MITF, PDGFRA, POLD1, and POLE (sequencing only); EPCAM and GREM1 (deletion/duplication only).    09/11/2021 Surgery   Left breast lumpectomy showed grade 2 invasive lobular carcinoma, lobular carcinoma in situ showing ductal involvement, invasive tumor focally present in the lateral margin, additional margins very close.  Tumor size greater than 5 cm. Changes consistent with prior biopsies.  Fibrocystic changes including extensive stromal fibrosis and adenosis.  Left axillary lymph node also confirmed metastatic lobular carcinoma.  Final pathologic staging was [id number]   11/03/2021 - 12/19/2021 Radiation Therapy   Site Technique Total Dose (Gy) Dose per Fx (Gy) Completed Fx Beam Energies  Breast, Left: Breast_L 3D 50.4/50.4 1.8 28/28 6X  Breast, Left: Breast_L_SCLV 3D 50.4/50.4 1.8 28/28 6X, 10X  Breast, Left: Breast_L_Bst 3D 10/10 2 5/5 6X     12/2021 -  Anti-estrogen oral therapy   Tamoxifen + Zoladex    Age at first child birth : 89  Nichole [name]  is here for a follow-up on Tamoxifen and goserelin started in August 2023. She noticed mild hot flashes, fatigue, effexor helps. She is overall  tolerating the treatment very well.  She is willing to try abemaciclib which has been proposed to her. She gets the Gosrelin injections in [place].   Rest of the pertinent 10 point ROS reviewed and negative  MEDICAL HISTORY:  Past Medical History:  Diagnosis Date   Asthma    exercise induced asthma   Cancer ([place]) 07/29/2021   left breast   Family history of breast cancer 08/14/2021   Family history of kidney cancer 08/14/2021   Pre-diabetes     SURGICAL HISTORY: Past Surgical History:  Procedure Laterality Date   BIOPSY  03/04/2021   Procedure: BIOPSY;  Surgeon: Felicie Morn, MD;  Location: [facility];  Service: General;;   BREAST BIOPSY Left 07/30/2021   BREAST BIOPSY Right 09/01/2021   BREAST BIOPSY Left 09/05/2021   BREAST LUMPECTOMY WITH RADIOACTIVE SEED AND SENTINEL LYMPH NODE BIOPSY Left 09/11/2021   Procedure: LEFT BREAST LUMPECTOMY WITH RADIOACTIVE SEED X3 AND LEFT SENTINEL LYMPH NODE BIOPSY;  Surgeon: Erroll Luna, MD;  Location: [facility];  Service: General;  Laterality: Left;   ESOPHAGOGASTRODUODENOSCOPY N/A 03/04/2021   Procedure: ESOPHAGOGASTRODUODENOSCOPY (EGD);  Surgeon: Felicie Morn, MD;  Location: [facility];  Service: General;  Laterality: N/A;   LAPAROSCOPIC GASTRIC SLEEVE RESECTION N/A 05/02/2021   Procedure: LAPAROSCOPIC GASTRIC SLEEVE RESECTION;  Surgeon: Felicie Morn, MD;  Location: [facility];  Service: General;  Laterality: N/A;   LAPAROSCOPIC VAGINAL HYSTERECTOMY WITH SALPINGECTOMY N/A 10/08/2016   Procedure: LAPAROSCOPIC ASSISTED VAGINAL HYSTERECTOMY WITH RIGHT SALPINGECTOMY LYSIS OF ADHESIONS;  Surgeon: Donnamae Jude, MD;  Location: [facility];  Service: Gynecology;  Laterality: N/A;   MYOMECTOMY  2014   RE-EXCISION OF BREAST LUMPECTOMY Left 09/24/2021   Procedure: RE-EXCISION OF LEFT BREAST LUMPECTOMY;  Surgeon: Erroll Luna, MD;  Location: [facility];  Service: General;  Laterality: Left;   Right Oophrectomy      UPPER GI ENDOSCOPY N/A 05/02/2021   Procedure: UPPER GI ENDOSCOPY;  Surgeon: Felicie Morn, MD;  Location: [facility];  Service: General;  Laterality: N/A;   WISDOM TOOTH EXTRACTION      SOCIAL HISTORY: Social History   Socioeconomic History   Marital status: Divorced    Spouse name: Not on file   Number of children: 1   Years of education: Not on file   Highest education level: Not on file  Occupational History   Not on file  Tobacco Use   Smoking status: Former    Packs/day: 0.10    Years: 15.00    Total pack years: 1.50    Types: Cigarettes    Quit date: 01/23/2017    Years since quitting: 5.2   Smokeless tobacco: Never  Vaping Use   Vaping Use: Never used  Substance and Sexual Activity   Alcohol use: Not Currently    Comment: none in last year per pt   Drug use: Not Currently   Sexual activity: Not Currently    Birth control/protection: None  Other Topics Concern   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   Has 31 year old son.   Moved from [place] in Sept. 2015   Social Determinants of Health   Financial Resource Strain: Not on file  Food Insecurity: Not on file  Transportation Needs: Not on file  Physical Activity: Not on file  Stress: Not on file  Social Connections: Not on file  Intimate Partner Violence: Not on file    FAMILY HISTORY: Family History  Problem Relation Age of Onset   Arthritis Mother    Breast cancer Mother 66       Lumpectomy   Hyperlipidemia Mother    Hypertension Mother    Kidney cancer Mother 36       Had ablation   Graves' disease Mother        Had thyroid removed   Graves' disease Brother        Deceased    ALLERGIES:  has No Known Allergies.  MEDICATIONS:  Current Outpatient Medications  Medication Sig Dispense Refill   abemaciclib (VERZENIO) 100 MG tablet Take 1 tablet (100 mg total) by mouth 2 (two) times daily. 56 tablet 1   albuterol  (VENTOLIN HFA) 108 (90 Base) MCG/ACT inhaler Inhale 1-2 puffs into the lungs every 6 (six) hours as needed for wheezing or shortness of breath. 1 each 0   CALCIUM PO Take 1 tablet by mouth 3 (three) times daily.     cetirizine (ZYRTEC) 10 MG tablet Take 1 tablet (10 mg total) by mouth at bedtime. For allergies (Patient taking differently: Take 10 mg by mouth as needed. For allergies) 90 tablet 0   fluticasone (FLONASE) 50 MCG/ACT nasal spray Place 1 spray into both nostrils 2 (two) times daily as needed for allergies or rhinitis. 48 mL 0   linaclotide (LINZESS) 72 MCG capsule Take 72 mcg by mouth daily as needed (constipation).     Multiple Vitamin (MULTI-VITAMIN) tablet Take 1 tablet by mouth daily.     tamoxifen (NOLVADEX) 20 MG tablet Take 1 tablet (20 mg total) by mouth daily. 90 tablet 3   venlafaxine XR (EFFEXOR-XR) 75 MG 24 hr capsule Take 1 capsule (75 mg total) by mouth daily with breakfast. 90 capsule 3   No current facility-administered medications for this visit.    PHYSICAL EXAMINATION: ECOG PERFORMANCE STATUS: 0 - Asymptomatic  Vitals:   04/28/22 1504  BP: (!) 144/57  Pulse: 78  Resp: 16  Temp: 97.7 F (36.5 C)  SpO2: 97%     Filed Weights   04/28/22 1504  Weight: 204 lb 8 oz (92.8 kg)     BP (!) 144/57 (BP Location: Left Arm, Patient Position: Sitting)   Pulse 78   Temp 97.7 F (36.5 C) (Temporal)   Resp 16   Ht _0  (1.676 m)   Wt 204 lb 8 oz (92.8 kg)   LMP 09/09/2016 (Approximate)   SpO2 97%   BMI 33.01 kg/m   Physical Exam Constitutional:      Appearance: Normal appearance.  Chest:     Comments: Breast exam, no palpable masses or regional adenopathy Musculoskeletal:     Cervical back: Normal range of motion and neck supple. No rigidity.  Lymphadenopathy:     Cervical: No cervical adenopathy.  Neurological:     Mental Status: She is alert.      LABORATORY DATA:  I have reviewed the data as listed Lab Results  Component Value Date   WBC  4.1 10/27/2021   HGB 11.9 (L) 10/27/2021   HCT 35.8 (L) 10/27/2021   MCV 91.6 10/27/2021   PLT 249 10/27/2021   Lab Results  Component Value Date   NA 138 08/13/2021   K 3.7 08/13/2021   CL 107 08/13/2021   CO2  25 08/13/2021    RADIOGRAPHIC STUDIES: I have personally reviewed the radiological reports and agreed with the findings in the report.  ASSESSMENT AND PLAN:  Malignant neoplasm of overlapping sites of left breast in female, estrogen receptor positive ([place]) This is a very pleasant 46 year old female patient, premenopausal with family history of breast cancer in mom who noticed breast abnormality in her left breast in December point for further investigation.  She had a mammogram, diagnostic mammogram as well as ultrasound which noticed an abnormality at the 12 o'clock position in the left breast measuring around 20 mm in its largest dimension along with a satellite nodule in the same breast at around 11 o'clock position measuring 4 x 3 x 6 mm without any enlarged left axillary lymphadenopathy.   Biopsy from these masses showed invasive mammary carcinoma, grade 2, prognostics ER 50% moderate staining, PR 95% strong staining, KI less than 5% HER2 negative on the larger mass and similar prognostics on the second mass.    Given the intermediate grade, strong ER/PR positivity, low proliferation index and HER2 negative tumor, we discussed about upfront surgery followed by consideration for Oncotype testing. However on the final pathology, it was thought that all her tumor was indeed 1 large mass measuring at least 5 cm, 2 out of 4 sentinel lymph nodes involved with tumor.  Final pathologic staging was PT3N1a.  Prognostics not repeated on the final sample.  Reexcision showed tumor measuring 2 mm.    She refused adjuvant chemotherapy.  She is now on goserelin with tamoxifen for adjuvant antiestrogen therapy.  Given positive lymph node disease, we have discussed again about role of abemaciclib,  mechanism of action of abemaciclib, adverse effects of abemaciclib based on [facility] E trial.  She is willing to try abemaciclib.  We have discussed about starting her on 100 mg p.o. twice daily and uptitrating it based on tolerance.  She will continue Zoladex and monthly labs at [facility].  She will return to clinic in approximately 4 weeks to assess toxicity.  Bilateral diagnostic mammogram scheduled for July 09, 2022 bone density scheduled for September 21, 2022 Total time spent:40 minutes  Thank you for consulting [place] in the care of this patient.  Please not hesitate to contact us with any additional questions or concerns. All questions were answered. The patient knows to call the clinic with any problems, questions or concerns.    Benay Pike, MD 04/29/22

## 2022-04-29 ENCOUNTER — Other Ambulatory Visit (HOSPITAL_COMMUNITY): Payer: Self-pay

## 2022-04-29 ENCOUNTER — Other Ambulatory Visit: Payer: Self-pay | Admitting: *Deleted

## 2022-04-29 ENCOUNTER — Encounter: Payer: Self-pay | Admitting: Hematology and Oncology

## 2022-04-29 DIAGNOSIS — C50812 Malignant neoplasm of overlapping sites of left female breast: Secondary | ICD-10-CM

## 2022-04-29 MED ORDER — ABEMACICLIB 100 MG PO TABS
100.0000 mg | ORAL_TABLET | Freq: Two times a day (BID) | ORAL | 1 refills | Status: DC
Start: 2022-04-29 — End: 2022-06-02

## 2022-04-29 NOTE — Telephone Encounter (Signed)
Oral Oncology Pharmacist Encounter  Prior Authorization for Melynda Keller has been approved.    PA# [id number] Effective dates: 04/28/22 through 04/29/23  Patients co-pay is $0 and has to be [facility]. [facility].  Oral Oncology Clinic will continue to follow.   Drema Halon, PharmD Hematology/Oncology Clinical Pharmacist [facility] [phone] 04/29/2022 9:25 AM

## 2022-05-05 NOTE — Telephone Encounter (Signed)
Oral Chemotherapy Pharmacist Encounter  I spoke with patient for overview of: Verzenio for the treatment of early stage, hormone-receptor positive breast cancer, in combination with tamoxifen, planned duration until disease progression or unacceptable toxicity.   Counseled patient on administration, dosing, side effects, monitoring, drug-food interactions, safe handling, storage, and disposal.  Patient will take Verzenio '100mg'$  tablets, 1 tablet by mouth twice daily without regard to food.  Patient knows to avoid grapefruit and grapefruit juice.  Verzenio start date: 05/07/2022 [facility] on [facility].   Adverse effects include but are not limited to: diarrhea, fatigue, nausea, abdominal pain, decreased blood counts, and increased liver function tests, and joint pains. Severe, life-threatening, and/or fatal interstitial lung disease (ILD) and/or pneumonitis may occur with CDK 4/6 inhibitors.  Patient has anti-emetic on hand and knows to take it if nausea develops.   Patient will obtain anti diarrheal and alert the office of 4 or more loose stools above baseline.  Reviewed with patient importance of keeping a medication schedule and plan for any missed doses. No barriers to medication adherence identified.  Medication reconciliation performed and medication/allergy list updated.  Insurance authorization for [employer] has been obtained. Test claim at the pharmacy revealed copayment $0 for 1st fill of 28 days. This will ship from [facility]. Patient has copay card that [facility] is using to cover patients copay.  Patient informed the pharmacy will reach out 5-7 days prior to needing next fill of Verzenio to coordinate continued medication acquisition to prevent break in therapy.  All questions answered.  Patient voiced understanding and appreciation.   Medication education handout placed in mail for patient. Patient knows to  call the office with questions or concerns. Oral Chemotherapy Clinic phone number provided to patient.   Drema Halon, PharmD Hematology/Oncology Clinical Pharmacist [facility] [phone] 05/05/2022   1:19 PM

## 2022-05-06 ENCOUNTER — Other Ambulatory Visit (HOSPITAL_COMMUNITY): Payer: Self-pay

## 2022-05-06 NOTE — Telephone Encounter (Signed)
Oral Oncology Patient Advocate Encounter  Prior Authorization for Nichole [name] has been approved.    PA# [id number] Effective dates: 04/28/22 through 04/29/23  [facility].    Lady Deutscher, CPhT-Adv Oncology Pharmacy Patient [facility] Direct Number: [phone]  Fax: [phone]

## 2022-05-08 ENCOUNTER — Other Ambulatory Visit: Payer: Self-pay

## 2022-05-08 ENCOUNTER — Inpatient Hospital Stay: Payer: BC Managed Care – PPO

## 2022-05-08 DIAGNOSIS — C50812 Malignant neoplasm of overlapping sites of left female breast: Secondary | ICD-10-CM

## 2022-05-08 DIAGNOSIS — Z87891 Personal history of nicotine dependence: Secondary | ICD-10-CM | POA: Diagnosis not present

## 2022-05-08 DIAGNOSIS — Z8051 Family history of malignant neoplasm of kidney: Secondary | ICD-10-CM | POA: Diagnosis not present

## 2022-05-08 DIAGNOSIS — Z5111 Encounter for antineoplastic chemotherapy: Secondary | ICD-10-CM | POA: Diagnosis not present

## 2022-05-08 DIAGNOSIS — Z170 Estrogen receptor positive status [ER+]: Secondary | ICD-10-CM | POA: Diagnosis not present

## 2022-05-08 DIAGNOSIS — Z79810 Long term (current) use of selective estrogen receptor modulators (SERMs): Secondary | ICD-10-CM | POA: Diagnosis not present

## 2022-05-08 DIAGNOSIS — Z803 Family history of malignant neoplasm of breast: Secondary | ICD-10-CM | POA: Diagnosis not present

## 2022-05-08 LAB — CBC WITH DIFFERENTIAL/PLATELET
Abs Immature Granulocytes: 0 10*3/uL (ref 0.00–0.07)
Basophils Absolute: 0 10*3/uL (ref 0.0–0.1)
Basophils Relative: 0 %
Eosinophils Absolute: 0.1 10*3/uL (ref 0.0–0.5)
Eosinophils Relative: 2 %
HCT: 34.2 % — ABNORMAL LOW (ref 36.0–46.0)
Hemoglobin: 11.6 g/dL — ABNORMAL LOW (ref 12.0–15.0)
Immature Granulocytes: 0 %
Lymphocytes Relative: 38 %
Lymphs Abs: 1.1 10*3/uL (ref 0.7–4.0)
MCH: 30.8 pg (ref 26.0–34.0)
MCHC: 33.9 g/dL (ref 30.0–36.0)
MCV: 90.7 fL (ref 80.0–100.0)
Monocytes Absolute: 0.3 10*3/uL (ref 0.1–1.0)
Monocytes Relative: 12 %
Neutro Abs: 1.3 10*3/uL — ABNORMAL LOW (ref 1.7–7.7)
Neutrophils Relative %: 48 %
Platelets: 188 10*3/uL (ref 150–400)
RBC: 3.77 MIL/uL — ABNORMAL LOW (ref 3.87–5.11)
RDW: 11.6 % (ref 11.5–15.5)
WBC: 2.8 10*3/uL — ABNORMAL LOW (ref 4.0–10.5)
nRBC: 0 % (ref 0.0–0.2)

## 2022-05-08 LAB — COMPREHENSIVE METABOLIC PANEL
ALT: 13 U/L (ref 0–44)
AST: 14 U/L — ABNORMAL LOW (ref 15–41)
Albumin: 3.8 g/dL (ref 3.5–5.0)
Alkaline Phosphatase: 49 U/L (ref 38–126)
Anion gap: 9 (ref 5–15)
BUN: 14 mg/dL (ref 6–20)
CO2: 26 mmol/L (ref 22–32)
Calcium: 8.8 mg/dL — ABNORMAL LOW (ref 8.9–10.3)
Chloride: 106 mmol/L (ref 98–111)
Creatinine, Ser: 0.99 mg/dL (ref 0.44–1.00)
GFR, Estimated: >60 mL/min (ref >60)
Glucose, Bld: 87 mg/dL (ref 70–99)
Potassium: 4.1 mmol/L (ref 3.5–5.1)
Sodium: 141 mmol/L (ref 135–145)
Total Bilirubin: 0.4 mg/dL (ref 0.3–1.2)
Total Protein: 7.3 g/dL (ref 6.5–8.1)

## 2022-05-08 MED ORDER — GOSERELIN ACETATE 3.6 MG ~~LOC~~ IMPL
3.6000 mg | DRUG_IMPLANT | Freq: Once | SUBCUTANEOUS | Status: AC
  Administered 2022-05-08: 3.6 mg via SUBCUTANEOUS
  Filled 2022-05-08: qty 3.6

## 2022-05-11 DIAGNOSIS — Z9011 Acquired absence of right breast and nipple: Secondary | ICD-10-CM | POA: Diagnosis not present

## 2022-05-11 DIAGNOSIS — C50912 Malignant neoplasm of unspecified site of left female breast: Secondary | ICD-10-CM | POA: Diagnosis not present

## 2022-05-20 DIAGNOSIS — C50912 Malignant neoplasm of unspecified site of left female breast: Secondary | ICD-10-CM | POA: Diagnosis not present

## 2022-05-20 DIAGNOSIS — Z9011 Acquired absence of right breast and nipple: Secondary | ICD-10-CM | POA: Diagnosis not present

## 2022-05-28 ENCOUNTER — Other Ambulatory Visit: Payer: Self-pay

## 2022-05-28 ENCOUNTER — Encounter: Payer: Self-pay | Admitting: Adult Health

## 2022-05-28 DIAGNOSIS — Z170 Estrogen receptor positive status [ER+]: Secondary | ICD-10-CM

## 2022-06-02 ENCOUNTER — Other Ambulatory Visit: Payer: Self-pay | Admitting: Hematology and Oncology

## 2022-06-02 DIAGNOSIS — Z170 Estrogen receptor positive status [ER+]: Secondary | ICD-10-CM

## 2022-06-02 MED ORDER — ABEMACICLIB 100 MG PO TABS: 100.0000 mg | ORAL_TABLET | Freq: Two times a day (BID) | ORAL | 1 refills | Status: DC

## 2022-06-02 NOTE — Progress Notes (Signed)
Abemaciclib refilled per request.  Nichole [name]

## 2022-06-05 ENCOUNTER — Inpatient Hospital Stay: Payer: BC Managed Care – PPO

## 2022-06-05 ENCOUNTER — Other Ambulatory Visit: Payer: Self-pay | Admitting: Hematology and Oncology

## 2022-06-05 ENCOUNTER — Inpatient Hospital Stay: Payer: BC Managed Care – PPO | Attending: Hematology and Oncology

## 2022-06-05 ENCOUNTER — Other Ambulatory Visit: Payer: Self-pay

## 2022-06-05 DIAGNOSIS — Z87891 Personal history of nicotine dependence: Secondary | ICD-10-CM | POA: Diagnosis not present

## 2022-06-05 DIAGNOSIS — Z803 Family history of malignant neoplasm of breast: Secondary | ICD-10-CM | POA: Diagnosis not present

## 2022-06-05 DIAGNOSIS — Z170 Estrogen receptor positive status [ER+]: Secondary | ICD-10-CM | POA: Insufficient documentation

## 2022-06-05 DIAGNOSIS — Z79818 Long term (current) use of other agents affecting estrogen receptors and estrogen levels: Secondary | ICD-10-CM | POA: Diagnosis not present

## 2022-06-05 DIAGNOSIS — Z79899 Other long term (current) drug therapy: Secondary | ICD-10-CM | POA: Diagnosis not present

## 2022-06-05 DIAGNOSIS — C50812 Malignant neoplasm of overlapping sites of left female breast: Secondary | ICD-10-CM | POA: Insufficient documentation

## 2022-06-05 DIAGNOSIS — C773 Secondary and unspecified malignant neoplasm of axilla and upper limb lymph nodes: Secondary | ICD-10-CM | POA: Diagnosis not present

## 2022-06-05 DIAGNOSIS — Z8051 Family history of malignant neoplasm of kidney: Secondary | ICD-10-CM | POA: Diagnosis not present

## 2022-06-05 DIAGNOSIS — Z79810 Long term (current) use of selective estrogen receptor modulators (SERMs): Secondary | ICD-10-CM | POA: Diagnosis not present

## 2022-06-05 DIAGNOSIS — Z90710 Acquired absence of both cervix and uterus: Secondary | ICD-10-CM | POA: Diagnosis not present

## 2022-06-05 DIAGNOSIS — K5900 Constipation, unspecified: Secondary | ICD-10-CM | POA: Insufficient documentation

## 2022-06-05 DIAGNOSIS — Z5111 Encounter for antineoplastic chemotherapy: Secondary | ICD-10-CM | POA: Diagnosis not present

## 2022-06-05 LAB — CBC WITH DIFFERENTIAL/PLATELET
Abs Immature Granulocytes: 0 10*3/uL (ref 0.00–0.07)
Basophils Absolute: 0 10*3/uL (ref 0.0–0.1)
Basophils Relative: 1 %
Eosinophils Absolute: 0 10*3/uL (ref 0.0–0.5)
Eosinophils Relative: 1 %
HCT: 33.2 % — ABNORMAL LOW (ref 36.0–46.0)
Hemoglobin: 11.4 g/dL — ABNORMAL LOW (ref 12.0–15.0)
Immature Granulocytes: 0 %
Lymphocytes Relative: 44 %
Lymphs Abs: 1 10*3/uL (ref 0.7–4.0)
MCH: 31.1 pg (ref 26.0–34.0)
MCHC: 34.3 g/dL (ref 30.0–36.0)
MCV: 90.7 fL (ref 80.0–100.0)
Monocytes Absolute: 0.2 10*3/uL (ref 0.1–1.0)
Monocytes Relative: 8 %
Neutro Abs: 1 10*3/uL — ABNORMAL LOW (ref 1.7–7.7)
Neutrophils Relative %: 46 %
Platelets: 232 10*3/uL (ref 150–400)
RBC: 3.66 MIL/uL — ABNORMAL LOW (ref 3.87–5.11)
RDW: 11.9 % (ref 11.5–15.5)
WBC: 2.2 10*3/uL — ABNORMAL LOW (ref 4.0–10.5)
nRBC: 0 % (ref 0.0–0.2)

## 2022-06-05 LAB — COMPREHENSIVE METABOLIC PANEL
ALT: 14 U/L (ref 0–44)
AST: 18 U/L (ref 15–41)
Albumin: 3.8 g/dL (ref 3.5–5.0)
Alkaline Phosphatase: 57 U/L (ref 38–126)
Anion gap: 7 (ref 5–15)
BUN: 14 mg/dL (ref 6–20)
CO2: 28 mmol/L (ref 22–32)
Calcium: 8.8 mg/dL — ABNORMAL LOW (ref 8.9–10.3)
Chloride: 104 mmol/L (ref 98–111)
Creatinine, Ser: 1.16 mg/dL — ABNORMAL HIGH (ref 0.44–1.00)
GFR, Estimated: 60 mL/min — ABNORMAL LOW (ref >60)
Glucose, Bld: 102 mg/dL — ABNORMAL HIGH (ref 70–99)
Potassium: 3.9 mmol/L (ref 3.5–5.1)
Sodium: 139 mmol/L (ref 135–145)
Total Bilirubin: 0.1 mg/dL — ABNORMAL LOW (ref 0.3–1.2)
Total Protein: 7.3 g/dL (ref 6.5–8.1)

## 2022-06-05 LAB — VITAMIN D 25 HYDROXY (VIT D DEFICIENCY, FRACTURES): Vit D, 25-Hydroxy: 11.29 ng/mL — ABNORMAL LOW (ref 30–100)

## 2022-06-05 MED ORDER — GOSERELIN ACETATE 3.6 MG ~~LOC~~ IMPL
3.6000 mg | DRUG_IMPLANT | Freq: Once | SUBCUTANEOUS | Status: AC
  Administered 2022-06-05: 3.6 mg via SUBCUTANEOUS
  Filled 2022-06-05: qty 3.6

## 2022-06-11 ENCOUNTER — Other Ambulatory Visit: Payer: Self-pay | Admitting: *Deleted

## 2022-06-11 ENCOUNTER — Telehealth: Payer: Self-pay | Admitting: *Deleted

## 2022-06-11 MED ORDER — ERGOCALCIFEROL 1.25 MG (50000 UT) PO CAPS: 50000.0000 [IU] | ORAL_CAPSULE | ORAL | 0 refills | Status: DC

## 2022-06-11 NOTE — Telephone Encounter (Signed)
Called pt with message below. Pt verbalized understanding and vitamin D was sent to pt preferred pharmacy

## 2022-06-11 NOTE — Telephone Encounter (Signed)
-----  Message from Benay Pike, MD sent at 06/11/2022  8:10 AM EST ----- Vit D is very low, not sure why this was ordered. Can we send Vit D 50000 Units weekly for 8 weeks prescription. After this she can take over the counter 1000 Units daily.

## 2022-06-15 ENCOUNTER — Other Ambulatory Visit (HOSPITAL_COMMUNITY): Payer: Self-pay

## 2022-06-24 ENCOUNTER — Telehealth: Payer: Self-pay

## 2022-06-24 ENCOUNTER — Encounter: Payer: Self-pay | Admitting: Hematology and Oncology

## 2022-06-24 ENCOUNTER — Inpatient Hospital Stay (HOSPITAL_BASED_OUTPATIENT_CLINIC_OR_DEPARTMENT_OTHER): Payer: BC Managed Care – PPO | Admitting: Hematology and Oncology

## 2022-06-24 ENCOUNTER — Other Ambulatory Visit (HOSPITAL_COMMUNITY): Payer: Self-pay

## 2022-06-24 VITALS — BP 132/82 | HR 69 | Temp 97.7°F | Resp 18 | Wt 206.1 lb

## 2022-06-24 DIAGNOSIS — C50812 Malignant neoplasm of overlapping sites of left female breast: Secondary | ICD-10-CM

## 2022-06-24 DIAGNOSIS — Z79818 Long term (current) use of other agents affecting estrogen receptors and estrogen levels: Secondary | ICD-10-CM | POA: Diagnosis not present

## 2022-06-24 DIAGNOSIS — Z79899 Other long term (current) drug therapy: Secondary | ICD-10-CM | POA: Diagnosis not present

## 2022-06-24 DIAGNOSIS — K5900 Constipation, unspecified: Secondary | ICD-10-CM | POA: Diagnosis not present

## 2022-06-24 DIAGNOSIS — Z90710 Acquired absence of both cervix and uterus: Secondary | ICD-10-CM | POA: Diagnosis not present

## 2022-06-24 DIAGNOSIS — Z79810 Long term (current) use of selective estrogen receptor modulators (SERMs): Secondary | ICD-10-CM | POA: Diagnosis not present

## 2022-06-24 DIAGNOSIS — Z87891 Personal history of nicotine dependence: Secondary | ICD-10-CM | POA: Diagnosis not present

## 2022-06-24 DIAGNOSIS — Z8051 Family history of malignant neoplasm of kidney: Secondary | ICD-10-CM | POA: Diagnosis not present

## 2022-06-24 DIAGNOSIS — Z5111 Encounter for antineoplastic chemotherapy: Secondary | ICD-10-CM | POA: Diagnosis not present

## 2022-06-24 DIAGNOSIS — Z803 Family history of malignant neoplasm of breast: Secondary | ICD-10-CM | POA: Diagnosis not present

## 2022-06-24 DIAGNOSIS — Z170 Estrogen receptor positive status [ER+]: Secondary | ICD-10-CM

## 2022-06-24 DIAGNOSIS — C773 Secondary and unspecified malignant neoplasm of axilla and upper limb lymph nodes: Secondary | ICD-10-CM | POA: Diagnosis not present

## 2022-06-24 MED ORDER — ABEMACICLIB 150 MG PO TABS
150.0000 mg | ORAL_TABLET | Freq: Two times a day (BID) | ORAL | 1 refills | Status: DC
  Filled 2022-06-24: qty 70, 35d supply, fill #0

## 2022-06-24 MED ORDER — ABEMACICLIB 150 MG PO TABS: 150.0000 mg | ORAL_TABLET | Freq: Two times a day (BID) | ORAL | 1 refills | Status: DC

## 2022-06-24 NOTE — Progress Notes (Signed)
[name] CONSULT NOTE  Patient Care Team: Pleas Koch, NP as PCP - General (Nurse Practitioner) Benay Pike, MD as Consulting Physician (Hematology and Oncology) Kyung Rudd, MD as Consulting Physician (Radiation Oncology) Erroll Luna, MD as Consulting Physician (General Surgery)  CHIEF COMPLAINTS/PURPOSE OF CONSULTATION:  Newly diagnosed breast cancer  HISTORY OF PRESENTING ILLNESS:   Nichole [name] 47 y.o. female is here because of recent diagnosis of left breast IDC  SUMMARY OF ONCOLOGIC HISTORY: Oncology History  Malignant neoplasm of overlapping sites of left breast in female, estrogen receptor positive ([place])  07/08/2021 Mammogram    Screening mammogram 07/08/2021 showed possible mass with distortion in the left breast. 07/21/2021 she had a left diagnostic mammogram which showed a suspicious left breast mass at 12 o'clock position suspicious satellite nodule left breast at 11 o'clock position Ultrasound same day showed a 16 x 20 x 12 mm irregular hypoechoic mass left breast at 12 o'clock position 4 cm from the nipple.  There is an adjacent satellite nodule within the left breast 11 o'clock position 3 cm from nipple measuring 4 x 3 x 6 mm.  No enlarged left axillary lymphadenopathy    07/30/2021 Pathology Results   Pathology from left breast mass 11:00, 3 cm from nipple showed invasive mammary carcinoma grade 2 prognostics ER 50% moderate staining, PR 95% strong staining, Ki-67 less than 5% and HER2 negative.  The second mass at 11:00 also showed similar prognostics and is grade 2.   08/11/2021 Initial Diagnosis   Malignant neoplasm of overlapping sites of left breast in female, estrogen receptor positive ([place])   08/11/2021 Cancer Staging   Staging form: Breast, AJCC 8th Edition - Pathologic: Stage IB (pT2, pN1, cM0, G2, ER+, PR+, HER2-) - Signed by Benay Pike, MD on 10/02/2021 Histologic grading system: 3 grade system   08/29/2021 Genetic  Testing   Negative hereditary cancer genetic testing: no pathogenic variants detected in [facility] CancerNext-Expanded +RNAinsight Panel.  Report date is 08/29/2021.   The CancerNext-Expanded gene panel offered by [facility] and includes sequencing, rearrangement, and RNA analysis for the following 77 genes: AIP, ALK, APC, ATM, AXIN2, BAP1, BARD1, BLM, BMPR1A, BRCA1, BRCA2, BRIP1, CDC73, CDH1, CDK4, CDKN1B, CDKN2A, CHEK2, CTNNA1, DICER1, FANCC, FH, FLCN, GALNT12, KIF1B, LZTR1, MAX, MEN1, MET, MLH1, MSH2, MSH3, MSH6, MUTYH, NBN, NF1, NF2, NTHL1, PALB2, PHOX2B, PMS2, POT1, PRKAR1A, PTCH1, PTEN, RAD51C, RAD51D, RB1, RECQL, RET, SDHA, SDHAF2, SDHB, SDHC, SDHD, SMAD4, SMARCA4, SMARCB1, SMARCE1, STK11, SUFU, TMEM127, TP53, TSC1, TSC2, VHL and XRCC2 (sequencing and deletion/duplication); EGFR, EGLN1, HOXB13, KIT, MITF, PDGFRA, POLD1, and POLE (sequencing only); EPCAM and GREM1 (deletion/duplication only).    09/11/2021 Surgery   Left breast lumpectomy showed grade 2 invasive lobular carcinoma, lobular carcinoma in situ showing ductal involvement, invasive tumor focally present in the lateral margin, additional margins very close.  Tumor size greater than 5 cm. Changes consistent with prior biopsies.  Fibrocystic changes including extensive stromal fibrosis and adenosis.  Left axillary lymph node also confirmed metastatic lobular carcinoma.  Final pathologic staging was PT3PN1A   11/03/2021 - 12/19/2021 Radiation Therapy   Site Technique Total Dose (Gy) Dose per Fx (Gy) Completed Fx Beam Energies  Breast, Left: Breast_L 3D 50.4/50.4 1.8 28/28 6X  Breast, Left: Breast_L_SCLV 3D 50.4/50.4 1.8 28/28 6X, 10X  Breast, Left: Breast_L_Bst 3D 10/10 2 5/5 6X     12/2021 -  Anti-estrogen oral therapy   Tamoxifen + Zoladex    Age at first child birth : 89  [name]  is here for a follow-up on Tamoxifen and goserelin started in August 2023. She is tolerating abemaciclib for the past 6/7 weeks. Some fatigue noted, no  nausea, diarrhea. She has constipation, taking colace daily.  She does report that she tries to exercise and feels like the hot flashes sudden.  Her most bothersome complaint is fatigue.  She when she goes back home from work, she feels like she could crash from lack of energy. She gets the Gosrelin injections in [place].   Rest of the pertinent 10 point ROS reviewed and negative  MEDICAL HISTORY:  Past Medical History:  Diagnosis Date   Asthma    exercise induced asthma   Cancer ([place]) 07/29/2021   left breast   Family history of breast cancer 08/14/2021   Family history of kidney cancer 08/14/2021   Pre-diabetes     SURGICAL HISTORY: Past Surgical History:  Procedure Laterality Date   BIOPSY  03/04/2021   Procedure: BIOPSY;  Surgeon: Felicie Morn, MD;  Location: [facility];  Service: General;;   BREAST BIOPSY Left 07/30/2021   BREAST BIOPSY Right 09/01/2021   BREAST BIOPSY Left 09/05/2021   BREAST LUMPECTOMY WITH RADIOACTIVE SEED AND SENTINEL LYMPH NODE BIOPSY Left 09/11/2021   Procedure: LEFT BREAST LUMPECTOMY WITH RADIOACTIVE SEED X3 AND LEFT SENTINEL LYMPH NODE BIOPSY;  Surgeon: Erroll Luna, MD;  Location: [facility];  Service: General;  Laterality: Left;   ESOPHAGOGASTRODUODENOSCOPY N/A 03/04/2021   Procedure: ESOPHAGOGASTRODUODENOSCOPY (EGD);  Surgeon: Felicie Morn, MD;  Location: [facility];  Service: General;  Laterality: N/A;   LAPAROSCOPIC GASTRIC SLEEVE RESECTION N/A 05/02/2021   Procedure: LAPAROSCOPIC GASTRIC SLEEVE RESECTION;  Surgeon: Felicie Morn, MD;  [facility];  Service: General;  Laterality: N/A;   LAPAROSCOPIC VAGINAL HYSTERECTOMY WITH SALPINGECTOMY N/A 10/08/2016   Procedure: LAPAROSCOPIC ASSISTED VAGINAL HYSTERECTOMY WITH RIGHT SALPINGECTOMY LYSIS OF ADHESIONS;  Surgeon: Donnamae Jude, MD;  Location: [facility];  Service: Gynecology;  Laterality: N/A;   MYOMECTOMY  2014   RE-EXCISION OF BREAST LUMPECTOMY Left 09/24/2021    Procedure: RE-EXCISION OF LEFT BREAST LUMPECTOMY;  Surgeon: Erroll Luna, MD;  Location: [facility];  Service: General;  Laterality: Left;   Right Oophrectomy     UPPER GI ENDOSCOPY N/A 05/02/2021   Procedure: UPPER GI ENDOSCOPY;  Surgeon: Felicie Morn, MD;  [facility];  Service: General;  Laterality: N/A;   WISDOM TOOTH EXTRACTION      SOCIAL HISTORY: Social History   Socioeconomic History   Marital status: Divorced    Spouse name: Not on file   Number of children: 1   Years of education: Not on file   Highest education level: Not on file  Occupational History   Not on file  Tobacco Use   Smoking status: Former    Packs/day: 0.10    Years: 15.00    Total pack years: 1.50    Types: Cigarettes    Quit date: 01/23/2017    Years since quitting: 5.4   Smokeless tobacco: Never  Vaping Use   Vaping Use: Never used  Substance and Sexual Activity   Alcohol use: Not Currently    Comment: none in last year per pt   Drug use: Not Currently   Sexual activity: Not Currently    Birth control/protection: None  Other Topics Concern   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  Has 74 year old son.   Moved from [place] in Sept. 2015   Social Determinants of Health   Financial Resource Strain: Not on file  Food Insecurity: Not on file  Transportation Needs: Not on file  Physical Activity: Not on file  Stress: Not on file  Social Connections: Not on file  Intimate Partner Violence: Not on file    FAMILY HISTORY: Family History  Problem Relation Age of Onset   Arthritis Mother    Breast cancer Mother 48       Lumpectomy   Hyperlipidemia Mother    Hypertension Mother    Kidney cancer Mother 14       Had ablation   Graves' disease Mother        Had thyroid removed   Graves' disease Brother        Deceased    ALLERGIES:  has No Known Allergies.  MEDICATIONS:   Current Outpatient Medications  Medication Sig Dispense Refill   abemaciclib (VERZENIO) 150 MG tablet Take 1 tablet (150 mg total) by mouth 2 (two) times daily. 60 tablet 1   albuterol (VENTOLIN HFA) 108 (90 Base) MCG/ACT inhaler Inhale 1-2 puffs into the lungs every 6 (six) hours as needed for wheezing or shortness of breath. 1 each 0   CALCIUM PO Take 1 tablet by mouth 3 (three) times daily.     cetirizine (ZYRTEC) 10 MG tablet Take 1 tablet (10 mg total) by mouth at bedtime. For allergies (Patient taking differently: Take 10 mg by mouth as needed. For allergies) 90 tablet 0   ergocalciferol (VITAMIN D2) 1.25 MG (50000 UT) capsule Take 1 capsule (50,000 Units total) by mouth once a week. 8 capsule 0   fluticasone (FLONASE) 50 MCG/ACT nasal spray Place 1 spray into both nostrils 2 (two) times daily as needed for allergies or rhinitis. 48 mL 0   linaclotide (LINZESS) 72 MCG capsule Take 72 mcg by mouth daily as needed (constipation).     Multiple Vitamin (MULTI-VITAMIN) tablet Take 1 tablet by mouth daily.     tamoxifen (NOLVADEX) 20 MG tablet Take 1 tablet (20 mg total) by mouth daily. 90 tablet 3   venlafaxine XR (EFFEXOR-XR) 75 MG 24 hr capsule Take 1 capsule (75 mg total) by mouth daily with breakfast. 90 capsule 3   No current facility-administered medications for this visit.    PHYSICAL EXAMINATION: ECOG PERFORMANCE STATUS: 0 - Asymptomatic  Vitals:   06/24/22 1451  BP: 132/82  Pulse: 69  Resp: 18  Temp: 97.7 F (36.5 C)  SpO2: 100%     Filed Weights   06/24/22 1451  Weight: 206 lb 2 oz (93.5 kg)     BP 132/82 (BP Location: Left Arm, Patient Position: Sitting)   Pulse 69   Temp 97.7 F (36.5 C) (Temporal)   Resp 18   Wt 206 lb 2 oz (93.5 kg)   LMP 09/09/2016 (Approximate)   SpO2 100%   BMI 33.27 kg/m   Physical Exam Constitutional:      Appearance: Normal appearance.  Chest:     Comments: Breast exam, no palpable masses or regional  adenopathy. Musculoskeletal:     Cervical back: Normal range of motion and neck supple. No rigidity.  Lymphadenopathy:     Cervical: No cervical adenopathy.  Neurological:     Mental Status: She is alert.      LABORATORY DATA:  I have reviewed the data as listed Lab Results  Component Value Date   WBC  2.2 (L) 06/05/2022   HGB 11.4 (L) 06/05/2022   HCT 33.2 (L) 06/05/2022   MCV 90.7 06/05/2022   PLT 232 06/05/2022   Lab Results  Component Value Date   NA 139 06/05/2022   K 3.9 06/05/2022   CL 104 06/05/2022   CO2 28 06/05/2022    RADIOGRAPHIC STUDIES: I have personally reviewed the radiological reports and agreed with the findings in the report.  ASSESSMENT AND PLAN:  Malignant neoplasm of overlapping sites of left breast in female, estrogen receptor positive ([place]) This is a very pleasant 47 year old female patient, premenopausal with family history of breast cancer in mom who noticed breast abnormality in her left breast in December point for further investigation.  She had a mammogram, diagnostic mammogram as well as ultrasound which noticed an abnormality at the 12 o'clock position in the left breast measuring around 20 mm in its largest dimension along with a satellite nodule in the same breast at around 11 o'clock position measuring 4 x 3 x 6 mm without any enlarged left axillary lymphadenopathy.   Biopsy from these masses showed invasive mammary carcinoma, grade 2, prognostics ER 50% moderate staining, PR 95% strong staining, KI less than 5% HER2 negative on the larger mass and similar prognostics on the second mass.    Given the intermediate grade, strong ER/PR positivity, low proliferation index and HER2 negative tumor, we discussed about upfront surgery followed by consideration for Oncotype testing. However on the final pathology, it was thought that all her tumor was indeed 1 large mass measuring at least 5 cm, 2 out of 4 sentinel lymph nodes involved with tumor.   Final pathologic staging was PT3N1a.  Prognostics not repeated on the final sample.  Reexcision showed tumor measuring 2 mm.    She refused adjuvant chemotherapy.  She is now on goserelin with tamoxifen for adjuvant antiestrogen therapy.  Given positive lymph node disease, we have discussed again about role of abemaciclib, mechanism of action of abemaciclib, adverse effects of abemaciclib based on [facility] E trial.  She is willing to try abemaciclib.  She is on 100 mg p.o. twice daily of abemaciclib and tolerating it well hence I have sent a new prescription for 150 mg twice daily.  I have also sent an in basket message to the outpatient pharmacy.  She will continue goserelin injection and labs every 4 weeks at [facility].  She will return to clinic in person in 8 weeks to see me.  Her next mammogram and bone density are scheduled.  Bilateral diagnostic mammogram scheduled for July 09, 2022 bone density scheduled for September 21, 2022 Total time spent:30 minutes  Thank you for consulting [place] in the care of this patient.  Please not hesitate to contact us with any additional questions or concerns. All questions were answered. The patient knows to call the clinic with any problems, questions or concerns.    Benay Pike, MD 06/24/22

## 2022-06-24 NOTE — Assessment & Plan Note (Addendum)
This is a very pleasant 47 year old female patient, premenopausal with family history of breast cancer in mom who noticed breast abnormality in her left breast in December point for further investigation.  She had a mammogram, diagnostic mammogram as well as ultrasound which noticed an abnormality at the 12 o'clock position in the left breast measuring around 20 mm in its largest dimension along with a satellite nodule in the same breast at around 11 o'clock position measuring 4 x 3 x 6 mm without any enlarged left axillary lymphadenopathy.   Biopsy from these masses showed invasive mammary carcinoma, grade 2, prognostics ER 50% moderate staining, PR 95% strong staining, KI less than 5% HER2 negative on the larger mass and similar prognostics on the second mass.    Given the intermediate grade, strong ER/PR positivity, low proliferation index and HER2 negative tumor, we discussed about upfront surgery followed by consideration for Oncotype testing. However on the final pathology, it was thought that all her tumor was indeed 1 large mass measuring at least 5 cm, 2 out of 4 sentinel lymph nodes involved with tumor.  Final pathologic staging was PT3N1a.  Prognostics not repeated on the final sample.  Reexcision showed tumor measuring 2 mm.    She refused adjuvant chemotherapy.  She is now on goserelin with tamoxifen for adjuvant antiestrogen therapy.  Given positive lymph node disease, we have discussed again about role of abemaciclib, mechanism of action of abemaciclib, adverse effects of abemaciclib based on [facility] E trial.  She is willing to try abemaciclib.  She is on 100 mg p.o. twice daily of abemaciclib and tolerating it well hence I have sent a new prescription for 150 mg twice daily.  I have also sent an in basket message to the outpatient pharmacy.  She will continue goserelin injection and labs every 4 weeks at [facility].  She will return to clinic in person in 8 weeks to see me.  Her next mammogram  and bone density are scheduled.

## 2022-06-26 NOTE — Telephone Encounter (Signed)
Oral Oncology Pharmacist Encounter  Prescription refill for Verzenio sent to [facility] in error. [facility]. [facility].   Drema Halon, PharmD Hematology/Oncology Clinical Pharmacist [facility] [phone] 06/26/2022 7:55 AM

## 2022-07-06 ENCOUNTER — Ambulatory Visit: Payer: BC Managed Care – PPO | Attending: Surgery

## 2022-07-06 ENCOUNTER — Other Ambulatory Visit: Payer: Self-pay | Admitting: Hematology and Oncology

## 2022-07-06 VITALS — Wt 203.1 lb

## 2022-07-06 DIAGNOSIS — Z483 Aftercare following surgery for neoplasm: Secondary | ICD-10-CM | POA: Insufficient documentation

## 2022-07-06 NOTE — Therapy (Signed)
OUTPATIENT PHYSICAL THERAPY [facility] SCREENING NOTE   Patient Name: Nichole [name] MRN: [id number] DOB:23-Oct-1975, 47 y.o., female Today's Date: 07/06/2022  PCP: Pleas Koch, NP REFERRING PROVIDER: Erroll Luna, MD   PT End of Session - 07/06/22 0913     Visit Number 1   # unchanged due to screen only   PT Start Time 0840    PT Stop Time 0850    PT Time Calculation (min) 10 min    Activity Tolerance Patient tolerated treatment well    Behavior During Therapy [facility] for tasks assessed/performed             Past Medical History:  Diagnosis Date   Asthma    exercise induced asthma   Cancer ([place]) 07/29/2021   left breast   Family history of breast cancer 08/14/2021   Family history of kidney cancer 08/14/2021   Pre-diabetes    Past Surgical History:  Procedure Laterality Date   BIOPSY  03/04/2021   Procedure: BIOPSY;  Surgeon: Felicie Morn, MD;  [facility];  Service: General;;   BREAST BIOPSY Left 07/30/2021   BREAST BIOPSY Right 09/01/2021   BREAST BIOPSY Left 09/05/2021   BREAST LUMPECTOMY WITH RADIOACTIVE SEED AND SENTINEL LYMPH NODE BIOPSY Left 09/11/2021   Procedure: LEFT BREAST LUMPECTOMY WITH RADIOACTIVE SEED X3 AND LEFT SENTINEL LYMPH NODE BIOPSY;  Surgeon: Erroll Luna, MD;  Location: [facility];  Service: General;  Laterality: Left;   ESOPHAGOGASTRODUODENOSCOPY N/A 03/04/2021   Procedure: ESOPHAGOGASTRODUODENOSCOPY (EGD);  Surgeon: Felicie Morn, MD;  Location: [facility];  Service: General;  Laterality: N/A;   LAPAROSCOPIC GASTRIC SLEEVE RESECTION N/A 05/02/2021   Procedure: LAPAROSCOPIC GASTRIC SLEEVE RESECTION;  Surgeon: Felicie Morn, MD;  [facility];  Service: General;  Laterality: N/A;   LAPAROSCOPIC VAGINAL HYSTERECTOMY WITH SALPINGECTOMY N/A 10/08/2016   Procedure: LAPAROSCOPIC ASSISTED VAGINAL HYSTERECTOMY WITH RIGHT SALPINGECTOMY LYSIS OF ADHESIONS;  Surgeon: Donnamae Jude, MD;  Location: [facility];  Service:  Gynecology;  Laterality: N/A;   MYOMECTOMY  2014   RE-EXCISION OF BREAST LUMPECTOMY Left 09/24/2021   Procedure: RE-EXCISION OF LEFT BREAST LUMPECTOMY;  Surgeon: Erroll Luna, MD;  Location: [facility];  Service: General;  Laterality: Left;   Right Oophrectomy     UPPER GI ENDOSCOPY N/A 05/02/2021   Procedure: UPPER GI ENDOSCOPY;  Surgeon: Felicie Morn, MD;  [facility];  Service: General;  Laterality: N/A;   WISDOM TOOTH EXTRACTION     Patient Active Problem List   Diagnosis Date Noted   Genetic testing 08/29/2021   Family history of breast cancer 08/14/2021   Family history of kidney cancer 08/14/2021   Malignant neoplasm of overlapping sites of left breast in female, estrogen receptor positive ([place]) 08/11/2021   Moderate persistent asthma with exacerbation 02/28/2021   Bilateral lower extremity edema 11/21/2020   Chronic right-sided low back pain with right-sided sciatica 08/01/2020   Preventative health care 04/11/2018   Environmental and seasonal allergies 04/11/2018   Pain of left thumb 04/11/2018   Hyperlipidemia 04/11/2018   Lumbar radiculopathy 08/16/2014   Prediabetes 08/02/2014   Morbid obesity with BMI of 40.0-44.9, adult ([place]) 08/02/2014   Tobacco abuse 08/02/2014    REFERRING DIAG: left breast cancer at risk for lymphedema  THERAPY DIAG:  Aftercare following surgery for neoplasm  PERTINENT HISTORY: left lumpectomy and SLNB on 09/11/21 with Dr. Marlou Starks with 2/4 LN positive.  Had re-excision 09/24/21.  Is recommended to have chemo but does not want to.  Will have radiation for sure  PRECAUTIONS: left UE Lymphedema risk, None  SUBJECTIVE: Pt returns for her 3 month L-Dex screen.   PAIN:  Are you having pain? No  SOZO SCREENING: Patient was assessed today using the SOZO machine to determine the lymphedema index score. This was compared to her baseline score. It was determined that she is within the recommended range when compared to her  baseline and no further action is needed at this time. She will continue SOZO screenings. These are done every 3 months for 2 years post operatively followed by every 6 months for 2 years, and then annually.   L-DEX FLOWSHEETS - 07/06/22 0900       L-DEX LYMPHEDEMA SCREENING   Measurement Type Unilateral    L-DEX MEASUREMENT EXTREMITY Upper Extremity    POSITION  Standing    DOMINANT SIDE Right    At Risk Side Left    BASELINE SCORE (UNILATERAL) -3.8    L-DEX SCORE (UNILATERAL) -4.4    VALUE CHANGE (UNILAT) -0.6               Otelia Limes, PTA 07/06/2022, 9:18 AM

## 2022-07-07 ENCOUNTER — Inpatient Hospital Stay: Payer: BC Managed Care – PPO

## 2022-07-09 ENCOUNTER — Encounter: Payer: Self-pay | Admitting: Hematology and Oncology

## 2022-07-09 ENCOUNTER — Ambulatory Visit
Admission: RE | Admit: 2022-07-09 | Discharge: 2022-07-09 | Disposition: A | Discharge status: Home / Self Care | Payer: BC Managed Care – PPO | Source: Ambulatory Visit | Attending: Hematology and Oncology | Admitting: Hematology and Oncology

## 2022-07-09 DIAGNOSIS — Z853 Personal history of malignant neoplasm of breast: Secondary | ICD-10-CM | POA: Diagnosis not present

## 2022-07-09 DIAGNOSIS — C50912 Malignant neoplasm of unspecified site of left female breast: Secondary | ICD-10-CM

## 2022-07-10 ENCOUNTER — Inpatient Hospital Stay: Payer: BC Managed Care – PPO

## 2022-07-10 ENCOUNTER — Inpatient Hospital Stay: Payer: BC Managed Care – PPO | Attending: Hematology and Oncology

## 2022-07-10 DIAGNOSIS — Z5111 Encounter for antineoplastic chemotherapy: Secondary | ICD-10-CM | POA: Diagnosis not present

## 2022-07-10 DIAGNOSIS — C50812 Malignant neoplasm of overlapping sites of left female breast: Secondary | ICD-10-CM | POA: Diagnosis not present

## 2022-07-10 DIAGNOSIS — Z170 Estrogen receptor positive status [ER+]: Secondary | ICD-10-CM | POA: Insufficient documentation

## 2022-07-10 DIAGNOSIS — Z79810 Long term (current) use of selective estrogen receptor modulators (SERMs): Secondary | ICD-10-CM | POA: Insufficient documentation

## 2022-07-10 LAB — CBC WITH DIFFERENTIAL (CANCER CENTER ONLY)
Abs Immature Granulocytes: 0.01 10*3/uL (ref 0.00–0.07)
Basophils Absolute: 0 10*3/uL (ref 0.0–0.1)
Basophils Relative: 0 %
Eosinophils Absolute: 0.1 10*3/uL (ref 0.0–0.5)
Eosinophils Relative: 2 %
HCT: 35.1 % — ABNORMAL LOW (ref 36.0–46.0)
Hemoglobin: 11.6 g/dL — ABNORMAL LOW (ref 12.0–15.0)
Immature Granulocytes: 0 %
Lymphocytes Relative: 39 %
Lymphs Abs: 1 10*3/uL (ref 0.7–4.0)
MCH: 31 pg (ref 26.0–34.0)
MCHC: 33 g/dL (ref 30.0–36.0)
MCV: 93.9 fL (ref 80.0–100.0)
Monocytes Absolute: 0.2 10*3/uL (ref 0.1–1.0)
Monocytes Relative: 8 %
Neutro Abs: 1.2 10*3/uL — ABNORMAL LOW (ref 1.7–7.7)
Neutrophils Relative %: 51 %
Platelet Count: 255 10*3/uL (ref 150–400)
RBC: 3.74 MIL/uL — ABNORMAL LOW (ref 3.87–5.11)
RDW: 13 % (ref 11.5–15.5)
WBC Count: 2.4 10*3/uL — ABNORMAL LOW (ref 4.0–10.5)
nRBC: 0 % (ref 0.0–0.2)

## 2022-07-10 LAB — CMP (CANCER CENTER ONLY)
ALT: 15 U/L (ref 0–44)
AST: 18 U/L (ref 15–41)
Albumin: 4 g/dL (ref 3.5–5.0)
Alkaline Phosphatase: 57 U/L (ref 38–126)
Anion gap: 6 (ref 5–15)
BUN: 17 mg/dL (ref 6–20)
CO2: 27 mmol/L (ref 22–32)
Calcium: 8.9 mg/dL (ref 8.9–10.3)
Chloride: 103 mmol/L (ref 98–111)
Creatinine: 1.22 mg/dL — ABNORMAL HIGH (ref 0.44–1.00)
GFR, Estimated: 56 mL/min — ABNORMAL LOW (ref >60)
Glucose, Bld: 90 mg/dL (ref 70–99)
Potassium: 4 mmol/L (ref 3.5–5.1)
Sodium: 136 mmol/L (ref 135–145)
Total Bilirubin: 0.4 mg/dL (ref 0.3–1.2)
Total Protein: 7.6 g/dL (ref 6.5–8.1)

## 2022-07-10 LAB — VITAMIN B12: Vitamin B-12: 333 pg/mL (ref 180–914)

## 2022-07-10 MED ORDER — GOSERELIN ACETATE 3.6 MG ~~LOC~~ IMPL
3.6000 mg | DRUG_IMPLANT | Freq: Once | SUBCUTANEOUS | Status: AC
  Administered 2022-07-10: 3.6 mg via SUBCUTANEOUS
  Filled 2022-07-10: qty 3.6

## 2022-07-27 ENCOUNTER — Telehealth: Payer: Self-pay | Admitting: *Deleted

## 2022-07-27 NOTE — Progress Notes (Unsigned)
Symptom Management Consult Note [place]    Patient Care Team: Pleas Koch, NP as PCP - General (Nurse Practitioner) Benay Pike, MD as Consulting Physician (Hematology and Oncology) Kyung Rudd, MD as Consulting Physician (Radiation Oncology) Erroll Luna, MD as Consulting Physician (General Surgery)    Name / MRN / DOB: Nichole [name]  [id number]  1975/12/29   Date of visit: 07/28/2022   Chief Complaint/Reason for visit: left rib pain   Current Therapy: abemaciclib, tamoxifen and zoladex    ASSESSMENT & PLAN: Patient is a 47 y.o. female  with oncologic history of malignant neoplasm of overlapping sites of left breast in female, estrogen receptor positive  followed by Dr. Chryl Heck.  I have viewed most recent oncology note and lab work.    #Malignant neoplasm of overlapping sites of left breast in female, estrogen receptor positive  - Chart review shows patient refused adjuvant chemotherapy. Started Abemaciclib at the end of January. Mammogram 07/09/22: benign postsurgical changes in the left breast. No mammographic evidence of malignancy bilaterally. -Next appointment with oncologist is 08/21/22   #Left rib pain - Patient well appearing. Tenderness to palpation of left lateral ribs. No overlying skin changes, no crepitus or deformity appreciated. Left breast has radiation changes with darkened skin otherwise breast exam is benign. Clear lung sounds on exam, no hypoxia. - Left rib and chest xray performed and is negative for bony lesions, fracture or acute infectious process. I viewed image and agree with radiologist impression. - Engaged in shared decision making with patient. As image is negative for concerning findings we can opt for close monitoring of symptoms vs advanced imaging with CT chest. Patient prefers for observation. I discussed OTC symptom management. No infectious symptoms to warrant antibiotics at this time. Symptoms not consistent  with PE. No trauma reported making occult rib fracture not seen on imaging less likely. Regardless management would be the same with symptom management in the absence of abnormal vitals. -Low suspicion for interstitial lung disease or pneumonitis from the abemaciclib in the absence of respiratory symptoms, no shortness of breath  #Hot flashes and joint pain -Bothersome since starting tamoxifen. Also reporting mild joint pain in her bilateral lower extremities when changing positions after being sedentary.  -Likely related to Tamoxifen and dicussed this with patient. -She does not feel symptoms are severe and they are not affecting ADLs. She plans to continue Tamoxifen and will discuss this with oncologist at next appointment if anything worsens.   Strict ED precautions discussed should symptoms worsen.   Heme/Onc History: Oncology History  Malignant neoplasm of overlapping sites of left breast in female, estrogen receptor positive ([place])  07/08/2021 Mammogram    Screening mammogram 07/08/2021 showed possible mass with distortion in the left breast. 07/21/2021 she had a left diagnostic mammogram which showed a suspicious left breast mass at 12 o'clock position suspicious satellite nodule left breast at 11 o'clock position Ultrasound same day showed a 16 x 20 x 12 mm irregular hypoechoic mass left breast at 12 o'clock position 4 cm from the nipple.  There is an adjacent satellite nodule within the left breast 11 o'clock position 3 cm from nipple measuring 4 x 3 x 6 mm.  No enlarged left axillary lymphadenopathy    07/30/2021 Pathology Results   Pathology from left breast mass 11:00, 3 cm from nipple showed invasive mammary carcinoma grade 2 prognostics ER 50% moderate staining, PR 95% strong staining, Ki-67 less than 5% and HER2 negative.  The  second mass at 11:00 also showed similar prognostics and is grade 2.   08/11/2021 Initial Diagnosis   Malignant neoplasm of overlapping sites of left breast  in female, estrogen receptor positive ([place])   08/11/2021 Cancer Staging   Staging form: Breast, AJCC 8th Edition - Pathologic: Stage IB (pT2, pN1, cM0, G2, ER+, PR+, HER2-) - Signed by Benay Pike, MD on 10/02/2021 Histologic grading system: 3 grade system   08/29/2021 Genetic Testing   Negative hereditary cancer genetic testing: no pathogenic variants detected in [facility] CancerNext-Expanded +RNAinsight Panel.  Report date is 08/29/2021.   The CancerNext-Expanded gene panel offered by [facility] and includes sequencing, rearrangement, and RNA analysis for the following 77 genes: AIP, ALK, APC, ATM, AXIN2, BAP1, BARD1, BLM, BMPR1A, BRCA1, BRCA2, BRIP1, CDC73, CDH1, CDK4, CDKN1B, CDKN2A, CHEK2, CTNNA1, DICER1, FANCC, FH, FLCN, GALNT12, KIF1B, LZTR1, MAX, MEN1, MET, MLH1, MSH2, MSH3, MSH6, MUTYH, NBN, NF1, NF2, NTHL1, PALB2, PHOX2B, PMS2, POT1, PRKAR1A, PTCH1, PTEN, RAD51C, RAD51D, RB1, RECQL, RET, SDHA, SDHAF2, SDHB, SDHC, SDHD, SMAD4, SMARCA4, SMARCB1, SMARCE1, STK11, SUFU, TMEM127, TP53, TSC1, TSC2, VHL and XRCC2 (sequencing and deletion/duplication); EGFR, EGLN1, HOXB13, KIT, MITF, PDGFRA, POLD1, and POLE (sequencing only); EPCAM and GREM1 (deletion/duplication only).    09/11/2021 Surgery   Left breast lumpectomy showed grade 2 invasive lobular carcinoma, lobular carcinoma in situ showing ductal involvement, invasive tumor focally present in the lateral margin, additional margins very close.  Tumor size greater than 5 cm. Changes consistent with prior biopsies.  Fibrocystic changes including extensive stromal fibrosis and adenosis.  Left axillary lymph node also confirmed metastatic lobular carcinoma.  Final pathologic staging was [id number]   11/03/2021 - 12/19/2021 Radiation Therapy   Site Technique Total Dose (Gy) Dose per Fx (Gy) Completed Fx Beam Energies  Breast, Left: Breast_L 3D 50.4/50.4 1.8 28/28 6X  Breast, Left: Breast_L_SCLV 3D 50.4/50.4 1.8 28/28 6X, 10X  Breast, Left: Breast_L_Bst 3D  10/10 2 5/5 6X     12/2021 -  Anti-estrogen oral therapy   Tamoxifen + Zoladex       Interval history-: Nichole [name] is a 47 y.o. female with oncologic history as above presenting to [facility] today with chief complaint of left rib pain x 3 weeks. Patient presents unaccompanied to clinic.   Patient states she has pain in her left ribs x 3 weeks ago.  At first she thought it might be because she had been falling asleep on the couch and sleeping in an uncomfortable position.  She stopped doing that and the pain did not improve.  She describes the pain as a tenderness and soreness to the touch.  She is unsure if there is any redness.  She admits to having darkened skin on her left breast from radiation that is unchanged today.  She denies any breast tenderness or pain.  She cannot recall any injury that could have caused her rib pain.  Rates it currently 3 out of 10 in severity.  She has tried over-the-counter pain creams and essential oils without much improvement.  She denies any cough, shortness of breath, wheezing.  Patient denies any respiratory illness.  She has not had fever or chills.  She states the pain in her ribs is worse with movement.  She does not think she has pulled a muscle.  She denies any numbness or tingling in her left arm.  Patient also states she continues to experiencing hot flashes and joint pain in her bilateral lower extremities.  She states the pain is most noticeable when  getting up from sleeping overnight or if she sits for a long period of time she will have pain when standing up.  She states the symptoms are not affecting her quality of life or activities of daily living at this time.     ROS  All other systems are reviewed and are negative for acute change except as noted in the HPI.    No Known Allergies   Past Medical History:  Diagnosis Date   Asthma    exercise induced asthma   Cancer ([place]) 07/29/2021   left breast   Family history of breast cancer  08/14/2021   Family history of kidney cancer 08/14/2021   Pre-diabetes      Past Surgical History:  Procedure Laterality Date   BIOPSY  03/04/2021   Procedure: BIOPSY;  Surgeon: Felicie Morn, MD;  Location: [facility];  Service: General;;   BREAST BIOPSY Left 07/30/2021   BREAST BIOPSY Right 09/01/2021   BREAST BIOPSY Left 09/05/2021   BREAST LUMPECTOMY WITH RADIOACTIVE SEED AND SENTINEL LYMPH NODE BIOPSY Left 09/11/2021   Procedure: LEFT BREAST LUMPECTOMY WITH RADIOACTIVE SEED X3 AND LEFT SENTINEL LYMPH NODE BIOPSY;  Surgeon: Erroll Luna, MD;  Location: [place];  Service: General;  Laterality: Left;   ESOPHAGOGASTRODUODENOSCOPY N/A 03/04/2021   Procedure: ESOPHAGOGASTRODUODENOSCOPY (EGD);  Surgeon: Felicie Morn, MD;  Location: [facility];  Service: General;  Laterality: N/A;   LAPAROSCOPIC GASTRIC SLEEVE RESECTION N/A 05/02/2021   Procedure: LAPAROSCOPIC GASTRIC SLEEVE RESECTION;  Surgeon: Felicie Morn, MD;  Location: [facility];  Service: General;  Laterality: N/A;   LAPAROSCOPIC VAGINAL HYSTERECTOMY WITH SALPINGECTOMY N/A 10/08/2016   Procedure: LAPAROSCOPIC ASSISTED VAGINAL HYSTERECTOMY WITH RIGHT SALPINGECTOMY LYSIS OF ADHESIONS;  Surgeon: Donnamae Jude, MD;  Location: [facility];  Service: Gynecology;  Laterality: N/A;   MYOMECTOMY  2014   RE-EXCISION OF BREAST LUMPECTOMY Left 09/24/2021   Procedure: RE-EXCISION OF LEFT BREAST LUMPECTOMY;  Surgeon: Erroll Luna, MD;  Location: [place];  Service: General;  Laterality: Left;   Right Oophrectomy     UPPER GI ENDOSCOPY N/A 05/02/2021   Procedure: UPPER GI ENDOSCOPY;  Surgeon: Felicie Morn, MD;  Location: [facility];  Service: General;  Laterality: N/A;   WISDOM TOOTH EXTRACTION      Social History   Socioeconomic History   Marital status: Divorced    Spouse name: Not on file   Number of children: 1   Years of education: Not on file   Highest education level: Not on file   Occupational History   Not on file  Tobacco Use   Smoking status: Former    Packs/day: 0.10    Years: 15.00    Total pack years: 1.50    Types: Cigarettes    Quit date: 01/23/2017    Years since quitting: 5.5   Smokeless tobacco: Never  Vaping Use   Vaping Use: Never used  Substance and Sexual Activity   Alcohol use: Not Currently    Comment: none in last year per pt   Drug use: Not Currently   Sexual activity: Not Currently    Birth control/protection: None  Other Topics Concern   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   Has 79 year old son.   Moved from [place] in Sept. 2015   Social Determinants of Health   Financial Resource Strain: Not on file  Food Insecurity: Not on file  Transportation Needs: Not on file  Physical Activity: Not on file  Stress: Not on file  Social Connections: Not on file  Intimate Partner Violence: Not on file    Family History  Problem Relation Age of Onset   Arthritis Mother    Breast cancer Mother 36       Lumpectomy   Hyperlipidemia Mother    Hypertension Mother    Kidney cancer Mother 41       Had ablation   Graves' disease Mother        Had thyroid removed   Graves' disease Brother        Deceased     Current Outpatient Medications:    abemaciclib (VERZENIO) 150 MG tablet, Take 1 tablet (150 mg total) by mouth 2 (two) times daily., Disp: 60 tablet, Rfl: 1   albuterol (VENTOLIN HFA) 108 (90 Base) MCG/ACT inhaler, Inhale 1-2 puffs into the lungs every 6 (six) hours as needed for wheezing or shortness of breath., Disp: 1 each, Rfl: 0   CALCIUM PO, Take 1 tablet by mouth 3 (three) times daily., Disp: , Rfl:    cetirizine (ZYRTEC) 10 MG tablet, Take 1 tablet (10 mg total) by mouth at bedtime. For allergies (Patient taking differently: Take 10 mg by mouth as needed. For allergies), Disp: 90 tablet, Rfl: 0   ergocalciferol (VITAMIN D2) 1.25 MG (50000 UT)  capsule, Take 1 capsule (50,000 Units total) by mouth once a week., Disp: 8 capsule, Rfl: 0   fluticasone (FLONASE) 50 MCG/ACT nasal spray, Place 1 spray into both nostrils 2 (two) times daily as needed for allergies or rhinitis., Disp: 48 mL, Rfl: 0   linaclotide (LINZESS) 72 MCG capsule, Take 72 mcg by mouth daily as needed (constipation)., Disp: , Rfl:    Multiple Vitamin (MULTI-VITAMIN) tablet, Take 1 tablet by mouth daily., Disp: , Rfl:    tamoxifen (NOLVADEX) 20 MG tablet, Take 1 tablet (20 mg total) by mouth daily., Disp: 90 tablet, Rfl: 3   venlafaxine XR (EFFEXOR-XR) 75 MG 24 hr capsule, Take 1 capsule (75 mg total) by mouth daily with breakfast., Disp: 90 capsule, Rfl: 3  PHYSICAL EXAM: ECOG FS:1 - Symptomatic but completely ambulatory    Vitals:   07/28/22 1338  BP: 135/74  Pulse: 82  Resp: 18  Temp: 98.6 F (37 C)  TempSrc: Oral  SpO2: 100%  Weight: 208 lb 1.6 oz (94.4 kg)  Height: '5\' 6"'$  (1.676 m)   Physical Exam Vitals and nursing note reviewed.  Constitutional:      Appearance: She is well-developed. She is not ill-appearing or toxic-appearing.  HENT:     Head: Normocephalic.     Nose: Nose normal.  Eyes:     Conjunctiva/sclera: Conjunctivae normal.  Neck:     Vascular: No JVD.  Cardiovascular:     Rate and Rhythm: Normal rate and regular rhythm.     Pulses: Normal pulses.     Heart sounds: Normal heart sounds.  Pulmonary:     Effort: Pulmonary effort is normal. No respiratory distress.     Breath sounds: Normal breath sounds. No stridor. No wheezing, rhonchi or rales.  Chest:     Chest wall: No tenderness.       Comments:  No anterior chest wall tenderness.  No deformity or crepitus noted.     Left breast with darkened skin, unchanged per patient. No tenderness, no palpable masses  Abdominal:     General: There is  no distension.  Musculoskeletal:        General: Normal range of motion.     Cervical back: Normal range of motion.     Comments:  Compartments in all extremities are soft and well perfused.  No cervical, thoracic, or lumbar spinal tenderness to palpation.  No paraspinal tenderness. No step offs, crepitus or deformity palpated.    Skin:    General: Skin is warm and dry.  Neurological:     Mental Status: She is oriented to person, place, and time.        LABORATORY DATA: I have reviewed the data as listed    Latest Ref Rng & Units 07/10/2022    1:01 PM 06/05/2022    1:09 PM 05/08/2022   11:42 AM  CBC  WBC 4.0 - 10.5 K/uL 2.4  2.2  2.8   Hemoglobin 12.0 - 15.0 g/dL 11.6  11.4  11.6   Hematocrit 36.0 - 46.0 % 35.1  33.2  34.2   Platelets 150 - 400 K/uL 255  232  188         Latest Ref Rng & Units 07/10/2022    1:01 PM 06/05/2022    1:09 PM 05/08/2022   11:42 AM  CMP  Glucose 70 - 99 mg/dL 90  102  87   BUN 6 - 20 mg/dL '17  14  14   '$ Creatinine 0.44 - 1.00 mg/dL 1.22  1.16  0.99   Sodium 135 - 145 mmol/L 136  139  141   Potassium 3.5 - 5.1 mmol/L 4.0  3.9  4.1   Chloride 98 - 111 mmol/L 103  104  106   CO2 22 - 32 mmol/L '27  28  26   '$ Calcium 8.9 - 10.3 mg/dL 8.9  8.8  8.8   Total Protein 6.5 - 8.1 g/dL 7.6  7.3  7.3   Total Bilirubin 0.3 - 1.2 mg/dL 0.4  0.1  0.4   Alkaline Phos 38 - 126 U/L 57  57  49   AST 15 - 41 U/L '18  18  14   '$ ALT 0 - 44 U/L '15  14  13        '$ RADIOGRAPHIC STUDIES (from last 24 hours if applicable) I have personally reviewed the radiological images as listed and agreed with the findings in the report. DG Ribs Unilateral W/Chest Left  Result Date: 07/28/2022 CLINICAL DATA:  Left rib pain without known injury. EXAM: LEFT RIBS AND CHEST - 3+ VIEW COMPARISON:  February 06, 2021. FINDINGS: No fracture or other bone lesions are seen involving the ribs. There is no evidence of pneumothorax or pleural effusion. Both lungs are clear. Heart size and mediastinal contours are within normal limits. IMPRESSION: Negative. Electronically Signed   By: Marijo Conception M.D.   On: 07/28/2022  15:21        Visit Diagnosis: 1. Rib pain on left side   2. Malignant neoplasm of overlapping sites of left breast in female, estrogen receptor positive ([place])   3. Hot flashes due to tamoxifen      Orders Placed This Encounter  Procedures   DG Ribs Unilateral W/Chest Left    Standing Status:   Future    Number of Occurrences:   1    Standing Expiration Date:   07/28/2023    Order Specific Question:   Reason for Exam (SYMPTOM  OR DIAGNOSIS REQUIRED)    Answer:   left rib pain. breast cancer pt  Order Specific Question:   Is patient pregnant?    Answer:   No    Order Specific Question:   Preferred imaging location?    Answer:   [facility]    All questions were answered. The patient knows to call the clinic with any problems, questions or concerns. No barriers to learning was detected.  I have spent a total of 20 minutes minutes of face-to-face and non-face-to-face time, preparing to see the patient, obtaining and/or reviewing separately obtained history, performing a medically appropriate examination, counseling and educating the patient, ordering tests, documenting clinical information in the electronic health record.    Thank you for allowing me to participate in the care of this patient.    Barrie Folk, PA-C Department of Hematology/Oncology [facility] at [facility] Phone: [phone]  [phone]    07/29/2022 9:32 AM

## 2022-07-27 NOTE — Telephone Encounter (Signed)
This RN spoke with pt per her call stating onset 2 weeks ago of mild skin changes to under her surgical breast.  Pt is left breast lobular s/p lumpectomy, declined chemo and is on tamoxifen, zoladex and verzenio.  Area is now feeling "edematous and firmer " with darkening of the skin and tender to touch.  Area is not adjacent to the surgical site but is under the breast.  She denies any redness or increased warmth.  This note will be forwarded to MD for review and further recommendation.

## 2022-07-28 ENCOUNTER — Other Ambulatory Visit: Payer: Self-pay

## 2022-07-28 ENCOUNTER — Inpatient Hospital Stay: Payer: BC Managed Care – PPO | Attending: Hematology and Oncology | Admitting: Physician Assistant

## 2022-07-28 ENCOUNTER — Ambulatory Visit (HOSPITAL_COMMUNITY)
Admission: RE | Admit: 2022-07-28 | Discharge: 2022-07-28 | Disposition: A | Discharge status: Home / Self Care | Payer: BC Managed Care – PPO | Source: Ambulatory Visit | Attending: Physician Assistant | Admitting: Physician Assistant

## 2022-07-28 VITALS — BP 135/74 | HR 82 | Temp 98.6°F | Resp 18 | Ht 66.0 in | Wt 208.1 lb

## 2022-07-28 DIAGNOSIS — Z87891 Personal history of nicotine dependence: Secondary | ICD-10-CM | POA: Insufficient documentation

## 2022-07-28 DIAGNOSIS — Z170 Estrogen receptor positive status [ER+]: Secondary | ICD-10-CM | POA: Insufficient documentation

## 2022-07-28 DIAGNOSIS — Z90710 Acquired absence of both cervix and uterus: Secondary | ICD-10-CM | POA: Insufficient documentation

## 2022-07-28 DIAGNOSIS — Z5111 Encounter for antineoplastic chemotherapy: Secondary | ICD-10-CM | POA: Insufficient documentation

## 2022-07-28 DIAGNOSIS — Z8051 Family history of malignant neoplasm of kidney: Secondary | ICD-10-CM | POA: Insufficient documentation

## 2022-07-28 DIAGNOSIS — Z803 Family history of malignant neoplasm of breast: Secondary | ICD-10-CM | POA: Diagnosis not present

## 2022-07-28 DIAGNOSIS — R232 Flushing: Secondary | ICD-10-CM

## 2022-07-28 DIAGNOSIS — Z79810 Long term (current) use of selective estrogen receptor modulators (SERMs): Secondary | ICD-10-CM | POA: Diagnosis not present

## 2022-07-28 DIAGNOSIS — C50812 Malignant neoplasm of overlapping sites of left female breast: Secondary | ICD-10-CM | POA: Insufficient documentation

## 2022-07-28 DIAGNOSIS — M2559 Pain in other specified joint: Secondary | ICD-10-CM | POA: Diagnosis not present

## 2022-07-28 DIAGNOSIS — Z9079 Acquired absence of other genital organ(s): Secondary | ICD-10-CM | POA: Insufficient documentation

## 2022-07-28 DIAGNOSIS — R0781 Pleurodynia: Secondary | ICD-10-CM | POA: Diagnosis not present

## 2022-07-28 DIAGNOSIS — T451X5A Adverse effect of antineoplastic and immunosuppressive drugs, initial encounter: Secondary | ICD-10-CM

## 2022-07-29 ENCOUNTER — Encounter: Payer: Self-pay | Admitting: Hematology and Oncology

## 2022-07-29 ENCOUNTER — Telehealth: Payer: Self-pay

## 2022-07-29 NOTE — Telephone Encounter (Signed)
RN called patient per request of Sherol Dade, PA-C regarding X-ray results. Patient verbalized understanding that the results were normal. Patient was advised that it was recommended for her to have a CT scan to ensure there is nothing that the X-ray missed which could be causing the discomfort. Patient declined CT scan at this time and wishes to see if the pain will resolve on its own. Patient was advised to call Dr. Rob Hickman office for any changes and that she can be further assessed at [facility] should she desire to be seen. Sherol Dade was made aware of patient's decision.

## 2022-07-31 ENCOUNTER — Telehealth: Payer: Self-pay | Admitting: Primary Care

## 2022-07-31 NOTE — Telephone Encounter (Signed)
Reached out to patient to reschedule appointment due to [place] being out of office, patient aware of date and time of appointment.

## 2022-08-05 ENCOUNTER — Inpatient Hospital Stay: Payer: BC Managed Care – PPO

## 2022-08-07 ENCOUNTER — Inpatient Hospital Stay: Payer: BC Managed Care – PPO

## 2022-08-07 VITALS — BP 128/87 | HR 78

## 2022-08-07 DIAGNOSIS — Z90710 Acquired absence of both cervix and uterus: Secondary | ICD-10-CM | POA: Diagnosis not present

## 2022-08-07 DIAGNOSIS — Z9079 Acquired absence of other genital organ(s): Secondary | ICD-10-CM | POA: Diagnosis not present

## 2022-08-07 DIAGNOSIS — Z87891 Personal history of nicotine dependence: Secondary | ICD-10-CM | POA: Diagnosis not present

## 2022-08-07 DIAGNOSIS — Z79810 Long term (current) use of selective estrogen receptor modulators (SERMs): Secondary | ICD-10-CM | POA: Diagnosis not present

## 2022-08-07 DIAGNOSIS — R232 Flushing: Secondary | ICD-10-CM | POA: Diagnosis not present

## 2022-08-07 DIAGNOSIS — C50812 Malignant neoplasm of overlapping sites of left female breast: Secondary | ICD-10-CM | POA: Diagnosis not present

## 2022-08-07 DIAGNOSIS — R0781 Pleurodynia: Secondary | ICD-10-CM | POA: Diagnosis not present

## 2022-08-07 DIAGNOSIS — M2559 Pain in other specified joint: Secondary | ICD-10-CM | POA: Diagnosis not present

## 2022-08-07 DIAGNOSIS — Z803 Family history of malignant neoplasm of breast: Secondary | ICD-10-CM | POA: Diagnosis not present

## 2022-08-07 DIAGNOSIS — Z8051 Family history of malignant neoplasm of kidney: Secondary | ICD-10-CM | POA: Diagnosis not present

## 2022-08-07 DIAGNOSIS — Z170 Estrogen receptor positive status [ER+]: Secondary | ICD-10-CM | POA: Diagnosis not present

## 2022-08-07 DIAGNOSIS — Z5111 Encounter for antineoplastic chemotherapy: Secondary | ICD-10-CM | POA: Diagnosis not present

## 2022-08-07 LAB — CMP (CANCER CENTER ONLY)
ALT: 15 U/L (ref 0–44)
AST: 23 U/L (ref 15–41)
Albumin: 3.7 g/dL (ref 3.5–5.0)
Alkaline Phosphatase: 52 U/L (ref 38–126)
Anion gap: 7 (ref 5–15)
BUN: 14 mg/dL (ref 6–20)
CO2: 25 mmol/L (ref 22–32)
Calcium: 8.8 mg/dL — ABNORMAL LOW (ref 8.9–10.3)
Chloride: 107 mmol/L (ref 98–111)
Creatinine: 1.02 mg/dL — ABNORMAL HIGH (ref 0.44–1.00)
GFR, Estimated: >60 mL/min (ref >60)
Glucose, Bld: 98 mg/dL (ref 70–99)
Potassium: 3.7 mmol/L (ref 3.5–5.1)
Sodium: 139 mmol/L (ref 135–145)
Total Bilirubin: 0.4 mg/dL (ref 0.3–1.2)
Total Protein: 7.3 g/dL (ref 6.5–8.1)

## 2022-08-07 LAB — CBC WITH DIFFERENTIAL (CANCER CENTER ONLY)
Abs Immature Granulocytes: 0 10*3/uL (ref 0.00–0.07)
Basophils Absolute: 0 10*3/uL (ref 0.0–0.1)
Basophils Relative: 1 %
Eosinophils Absolute: 0.1 10*3/uL (ref 0.0–0.5)
Eosinophils Relative: 4 %
HCT: 32.2 % — ABNORMAL LOW (ref 36.0–46.0)
Hemoglobin: 10.8 g/dL — ABNORMAL LOW (ref 12.0–15.0)
Immature Granulocytes: 0 %
Lymphocytes Relative: 38 %
Lymphs Abs: 0.8 10*3/uL (ref 0.7–4.0)
MCH: 31.8 pg (ref 26.0–34.0)
MCHC: 33.5 g/dL (ref 30.0–36.0)
MCV: 94.7 fL (ref 80.0–100.0)
Monocytes Absolute: 0.2 10*3/uL (ref 0.1–1.0)
Monocytes Relative: 9 %
Neutro Abs: 1 10*3/uL — ABNORMAL LOW (ref 1.7–7.7)
Neutrophils Relative %: 48 %
Platelet Count: 229 10*3/uL (ref 150–400)
RBC: 3.4 MIL/uL — ABNORMAL LOW (ref 3.87–5.11)
RDW: 13.1 % (ref 11.5–15.5)
WBC Count: 2 10*3/uL — ABNORMAL LOW (ref 4.0–10.5)
nRBC: 0 % (ref 0.0–0.2)

## 2022-08-07 MED ORDER — GOSERELIN ACETATE 3.6 MG ~~LOC~~ IMPL
3.6000 mg | DRUG_IMPLANT | Freq: Once | SUBCUTANEOUS | Status: AC
  Administered 2022-08-07: 3.6 mg via SUBCUTANEOUS
  Filled 2022-08-07: qty 3.6

## 2022-08-21 ENCOUNTER — Ambulatory Visit: Payer: BC Managed Care – PPO | Admitting: Hematology

## 2022-08-21 ENCOUNTER — Ambulatory Visit: Payer: BC Managed Care – PPO | Admitting: Hematology and Oncology

## 2022-09-02 ENCOUNTER — Telehealth: Payer: Self-pay | Admitting: Hematology and Oncology

## 2022-09-02 NOTE — Telephone Encounter (Signed)
Patient left voicemail to reschedule appointment for tomorrow; reached out to patient, patient is aware of  date and time change.

## 2022-09-03 ENCOUNTER — Inpatient Hospital Stay: Payer: BC Managed Care – PPO | Admitting: Hematology and Oncology

## 2022-09-04 ENCOUNTER — Inpatient Hospital Stay: Payer: BC Managed Care – PPO | Attending: Hematology and Oncology

## 2022-09-04 ENCOUNTER — Inpatient Hospital Stay: Payer: BC Managed Care – PPO

## 2022-09-04 DIAGNOSIS — Z803 Family history of malignant neoplasm of breast: Secondary | ICD-10-CM | POA: Insufficient documentation

## 2022-09-04 DIAGNOSIS — C50812 Malignant neoplasm of overlapping sites of left female breast: Secondary | ICD-10-CM | POA: Insufficient documentation

## 2022-09-04 DIAGNOSIS — Z5111 Encounter for antineoplastic chemotherapy: Secondary | ICD-10-CM | POA: Diagnosis not present

## 2022-09-04 DIAGNOSIS — Z87891 Personal history of nicotine dependence: Secondary | ICD-10-CM | POA: Diagnosis not present

## 2022-09-04 DIAGNOSIS — Z170 Estrogen receptor positive status [ER+]: Secondary | ICD-10-CM

## 2022-09-04 DIAGNOSIS — Z79810 Long term (current) use of selective estrogen receptor modulators (SERMs): Secondary | ICD-10-CM | POA: Diagnosis not present

## 2022-09-04 DIAGNOSIS — R42 Dizziness and giddiness: Secondary | ICD-10-CM | POA: Insufficient documentation

## 2022-09-04 DIAGNOSIS — R6883 Chills (without fever): Secondary | ICD-10-CM | POA: Diagnosis not present

## 2022-09-04 DIAGNOSIS — Z8051 Family history of malignant neoplasm of kidney: Secondary | ICD-10-CM | POA: Insufficient documentation

## 2022-09-04 LAB — CBC WITH DIFFERENTIAL (CANCER CENTER ONLY)
Abs Immature Granulocytes: 0 10*3/uL (ref 0.00–0.07)
Basophils Absolute: 0 10*3/uL (ref 0.0–0.1)
Basophils Relative: 0 %
Eosinophils Absolute: 0 10*3/uL (ref 0.0–0.5)
Eosinophils Relative: 1 %
HCT: 30.7 % — ABNORMAL LOW (ref 36.0–46.0)
Hemoglobin: 10.6 g/dL — ABNORMAL LOW (ref 12.0–15.0)
Immature Granulocytes: 0 %
Lymphocytes Relative: 43 %
Lymphs Abs: 1 10*3/uL (ref 0.7–4.0)
MCH: 32.8 pg (ref 26.0–34.0)
MCHC: 34.5 g/dL (ref 30.0–36.0)
MCV: 95 fL (ref 80.0–100.0)
Monocytes Absolute: 0.2 10*3/uL (ref 0.1–1.0)
Monocytes Relative: 8 %
Neutro Abs: 1.2 10*3/uL — ABNORMAL LOW (ref 1.7–7.7)
Neutrophils Relative %: 48 %
Platelet Count: 242 10*3/uL (ref 150–400)
RBC: 3.23 MIL/uL — ABNORMAL LOW (ref 3.87–5.11)
RDW: 12 % (ref 11.5–15.5)
WBC Count: 2.4 10*3/uL — ABNORMAL LOW (ref 4.0–10.5)
nRBC: 0 % (ref 0.0–0.2)

## 2022-09-04 LAB — CMP (CANCER CENTER ONLY)
ALT: 18 U/L (ref 0–44)
AST: 22 U/L (ref 15–41)
Albumin: 3.7 g/dL (ref 3.5–5.0)
Alkaline Phosphatase: 54 U/L (ref 38–126)
Anion gap: 5 (ref 5–15)
BUN: 19 mg/dL (ref 6–20)
CO2: 25 mmol/L (ref 22–32)
Calcium: 8.8 mg/dL — ABNORMAL LOW (ref 8.9–10.3)
Chloride: 107 mmol/L (ref 98–111)
Creatinine: 1.17 mg/dL — ABNORMAL HIGH (ref 0.44–1.00)
GFR, Estimated: 59 mL/min — ABNORMAL LOW (ref >60)
Glucose, Bld: 119 mg/dL — ABNORMAL HIGH (ref 70–99)
Potassium: 3.7 mmol/L (ref 3.5–5.1)
Sodium: 137 mmol/L (ref 135–145)
Total Bilirubin: 0.3 mg/dL (ref 0.3–1.2)
Total Protein: 7.4 g/dL (ref 6.5–8.1)

## 2022-09-04 MED ORDER — GOSERELIN ACETATE 3.6 MG ~~LOC~~ IMPL
3.6000 mg | DRUG_IMPLANT | Freq: Once | SUBCUTANEOUS | Status: AC
  Administered 2022-09-04: 3.6 mg via SUBCUTANEOUS
  Filled 2022-09-04: qty 3.6

## 2022-09-14 ENCOUNTER — Encounter: Payer: Self-pay | Admitting: Physician Assistant

## 2022-09-15 ENCOUNTER — Telehealth: Payer: Self-pay

## 2022-09-15 ENCOUNTER — Inpatient Hospital Stay: Payer: BC Managed Care – PPO | Admitting: Physician Assistant

## 2022-09-15 ENCOUNTER — Inpatient Hospital Stay: Payer: BC Managed Care – PPO

## 2022-09-15 NOTE — Telephone Encounter (Signed)
This RN spoke with patient regarding [name] appointment scheduled for today. Patient will be seeing Dr. Al Pimple tomorrow for headache and vertigo due to unforseen circumstances with [facility] provider. Patient verbalized understanding and of appointment change. Patient was advised to go to the ER if she feels she cannot wait to be seen or should symptoms worsen. Dr. Al Pimple made aware.

## 2022-09-16 ENCOUNTER — Encounter: Payer: Self-pay | Admitting: Hematology and Oncology

## 2022-09-16 ENCOUNTER — Inpatient Hospital Stay: Payer: BC Managed Care – PPO

## 2022-09-16 ENCOUNTER — Other Ambulatory Visit: Payer: Self-pay

## 2022-09-16 ENCOUNTER — Inpatient Hospital Stay (HOSPITAL_BASED_OUTPATIENT_CLINIC_OR_DEPARTMENT_OTHER): Payer: BC Managed Care – PPO | Admitting: Hematology and Oncology

## 2022-09-16 VITALS — BP 131/74 | HR 94 | Temp 98.2°F | Resp 16 | Ht 66.0 in | Wt 205.2 lb

## 2022-09-16 DIAGNOSIS — R6883 Chills (without fever): Secondary | ICD-10-CM | POA: Diagnosis not present

## 2022-09-16 DIAGNOSIS — Z803 Family history of malignant neoplasm of breast: Secondary | ICD-10-CM | POA: Diagnosis not present

## 2022-09-16 DIAGNOSIS — Z5111 Encounter for antineoplastic chemotherapy: Secondary | ICD-10-CM | POA: Diagnosis not present

## 2022-09-16 DIAGNOSIS — Z79810 Long term (current) use of selective estrogen receptor modulators (SERMs): Secondary | ICD-10-CM | POA: Diagnosis not present

## 2022-09-16 DIAGNOSIS — C50812 Malignant neoplasm of overlapping sites of left female breast: Secondary | ICD-10-CM

## 2022-09-16 DIAGNOSIS — Z170 Estrogen receptor positive status [ER+]: Secondary | ICD-10-CM | POA: Diagnosis not present

## 2022-09-16 DIAGNOSIS — Z8051 Family history of malignant neoplasm of kidney: Secondary | ICD-10-CM | POA: Diagnosis not present

## 2022-09-16 DIAGNOSIS — R42 Dizziness and giddiness: Secondary | ICD-10-CM | POA: Diagnosis not present

## 2022-09-16 DIAGNOSIS — Z87891 Personal history of nicotine dependence: Secondary | ICD-10-CM | POA: Diagnosis not present

## 2022-09-16 LAB — CBC WITH DIFFERENTIAL (CANCER CENTER ONLY)
Abs Immature Granulocytes: 0 10*3/uL (ref 0.00–0.07)
Basophils Absolute: 0 10*3/uL (ref 0.0–0.1)
Basophils Relative: 1 %
Eosinophils Absolute: 0 10*3/uL (ref 0.0–0.5)
Eosinophils Relative: 2 %
HCT: 33.6 % — ABNORMAL LOW (ref 36.0–46.0)
Hemoglobin: 11.7 g/dL — ABNORMAL LOW (ref 12.0–15.0)
Immature Granulocytes: 0 %
Lymphocytes Relative: 43 %
Lymphs Abs: 0.9 10*3/uL (ref 0.7–4.0)
MCH: 32.9 pg (ref 26.0–34.0)
MCHC: 34.8 g/dL (ref 30.0–36.0)
MCV: 94.4 fL (ref 80.0–100.0)
Monocytes Absolute: 0.2 10*3/uL (ref 0.1–1.0)
Monocytes Relative: 9 %
Neutro Abs: 0.9 10*3/uL — ABNORMAL LOW (ref 1.7–7.7)
Neutrophils Relative %: 45 %
Platelet Count: 219 10*3/uL (ref 150–400)
RBC: 3.56 MIL/uL — ABNORMAL LOW (ref 3.87–5.11)
RDW: 11.8 % (ref 11.5–15.5)
WBC Count: 2 10*3/uL — ABNORMAL LOW (ref 4.0–10.5)
nRBC: 0 % (ref 0.0–0.2)

## 2022-09-16 LAB — CMP (CANCER CENTER ONLY)
ALT: 12 U/L (ref 0–44)
AST: 14 U/L — ABNORMAL LOW (ref 15–41)
Albumin: 4 g/dL (ref 3.5–5.0)
Alkaline Phosphatase: 59 U/L (ref 38–126)
Anion gap: 6 (ref 5–15)
BUN: 16 mg/dL (ref 6–20)
CO2: 27 mmol/L (ref 22–32)
Calcium: 9.5 mg/dL (ref 8.9–10.3)
Chloride: 109 mmol/L (ref 98–111)
Creatinine: 1.06 mg/dL — ABNORMAL HIGH (ref 0.44–1.00)
GFR, Estimated: >60 mL/min (ref >60)
Glucose, Bld: 118 mg/dL — ABNORMAL HIGH (ref 70–99)
Potassium: 3.5 mmol/L (ref 3.5–5.1)
Sodium: 142 mmol/L (ref 135–145)
Total Bilirubin: 0.4 mg/dL (ref 0.3–1.2)
Total Protein: 7.5 g/dL (ref 6.5–8.1)

## 2022-09-16 NOTE — Assessment & Plan Note (Signed)
This is a very pleasant 47 year old female patient, premenopausal with family history of breast cancer in mom who noticed breast abnormality in her left breast in December point for further investigation.  She had a mammogram, diagnostic mammogram as well as ultrasound which noticed an abnormality at the 12 o'clock position in the left breast measuring around 20 mm in its largest dimension along with a satellite nodule in the same breast at around 11 o'clock position measuring 4 x 3 x 6 mm without any enlarged left axillary lymphadenopathy.   Biopsy from these masses showed invasive mammary carcinoma, grade 2, prognostics ER 50% moderate staining, PR 95% strong staining, KI less than 5% HER2 negative on the larger mass and similar prognostics on the second mass.    Given the intermediate grade, strong ER/PR positivity, low proliferation index and HER2 negative tumor, we discussed about upfront surgery followed by consideration for Oncotype testing. However on the final pathology, it was thought that all her tumor was indeed 1 large mass measuring at least 5 cm, 2 out of 4 sentinel lymph nodes involved with tumor.  Final pathologic staging was PT3N1a.  Prognostics not repeated on the final sample.  Reexcision showed tumor measuring 2 mm.    She refused adjuvant chemotherapy.  She is now on goserelin with tamoxifen and abemaciclib. She is here for an unexpected visit because of some vertigo like symptoms, a day of chills. She is improving, taking dramamine PRN Offered IVF, however she wants to continue oral intake since she feels better She may have a viral URI and transient symptoms related to this. I don't believe this is from the medication or the malignancy. She was encouraged to call us with any worsening symptoms Left breast palpable tenderness is likely post treatment changes, no palpable masses. Most recent mammogram neg for malignancy. RTC as planned in 8 weeks. Labs today.

## 2022-09-16 NOTE — Progress Notes (Signed)
[facility] CONSULT NOTE  Patient Care Team: Doreene Nest, NP as PCP - General (Nurse Practitioner) Rachel Moulds, MD as Consulting Physician (Hematology and Oncology) Dorothy Puffer, MD as Consulting Physician (Radiation Oncology) Harriette Bouillon, MD as Consulting Physician (General Surgery)  CHIEF COMPLAINTS/PURPOSE OF CONSULTATION:  Newly diagnosed breast cancer  HISTORY OF PRESENTING ILLNESS:   Nichole [name] 47 y.o. female is here because of recent diagnosis of left breast IDC  SUMMARY OF ONCOLOGIC HISTORY: Oncology History  Malignant neoplasm of overlapping sites of left breast in female, estrogen receptor positive  07/08/2021 Mammogram    Screening mammogram 07/08/2021 showed possible mass with distortion in the left breast. 07/21/2021 she had a left diagnostic mammogram which showed a suspicious left breast mass at 12 o'clock position suspicious satellite nodule left breast at 11 o'clock position Ultrasound same day showed a 16 x 20 x 12 mm irregular hypoechoic mass left breast at 12 o'clock position 4 cm from the nipple.  There is an adjacent satellite nodule within the left breast 11 o'clock position 3 cm from nipple measuring 4 x 3 x 6 mm.  No enlarged left axillary lymphadenopathy    07/30/2021 Pathology Results   Pathology from left breast mass 11:00, 3 cm from nipple showed invasive mammary carcinoma grade 2 prognostics ER 50% moderate staining, PR 95% strong staining, Ki-67 less than 5% and HER2 negative.  The second mass at 11:00 also showed similar prognostics and is grade 2.   08/11/2021 Initial Diagnosis   Malignant neoplasm of overlapping sites of left breast in female, estrogen receptor positive (HCC)   08/11/2021 Cancer Staging   Staging form: Breast, AJCC 8th Edition - Pathologic: Stage IB (pT2, pN1, cM0, G2, ER+, PR+, HER2-) - Signed by Rachel Moulds, MD on 10/02/2021 Histologic grading system: 3 grade system   08/29/2021 Genetic Testing    Negative hereditary cancer genetic testing: no pathogenic variants detected in [facility] CancerNext-Expanded +RNAinsight Panel.  Report date is 08/29/2021.   The CancerNext-Expanded gene panel offered by [facility] and includes sequencing, rearrangement, and RNA analysis for the following 77 genes: AIP, ALK, APC, ATM, AXIN2, BAP1, BARD1, BLM, BMPR1A, BRCA1, BRCA2, BRIP1, CDC73, CDH1, CDK4, CDKN1B, CDKN2A, CHEK2, CTNNA1, DICER1, FANCC, FH, FLCN, GALNT12, KIF1B, LZTR1, MAX, MEN1, MET, MLH1, MSH2, MSH3, MSH6, MUTYH, NBN, NF1, NF2, NTHL1, PALB2, PHOX2B, PMS2, POT1, PRKAR1A, PTCH1, PTEN, RAD51C, RAD51D, RB1, RECQL, RET, SDHA, SDHAF2, SDHB, SDHC, SDHD, SMAD4, SMARCA4, SMARCB1, SMARCE1, STK11, SUFU, TMEM127, TP53, TSC1, TSC2, VHL and XRCC2 (sequencing and deletion/duplication); EGFR, EGLN1, HOXB13, KIT, MITF, PDGFRA, POLD1, and POLE (sequencing only); EPCAM and GREM1 (deletion/duplication only).    09/11/2021 Surgery   Left breast lumpectomy showed grade 2 invasive lobular carcinoma, lobular carcinoma in situ showing ductal involvement, invasive tumor focally present in the lateral margin, additional margins very close.  Tumor size greater than 5 cm. Changes consistent with prior biopsies.  Fibrocystic changes including extensive stromal fibrosis and adenosis.  Left axillary lymph node also confirmed metastatic lobular carcinoma.  Final pathologic staging was [id number]   11/03/2021 - 12/19/2021 Radiation Therapy   Site Technique Total Dose (Gy) Dose per Fx (Gy) Completed Fx Beam Energies  Breast, Left: Breast_L 3D 50.4/50.4 1.8 28/28 6X  Breast, Left: Breast_L_SCLV 3D 50.4/50.4 1.8 28/28 6X, 10X  Breast, Left: Breast_L_Bst 3D 10/10 2 5/5 6X     12/2021 -  Anti-estrogen oral therapy   Tamoxifen + Zoladex    Age at first child birth : 33  Nichole [name] is  here for a follow-up on Tamoxifen and goserelin started in August 2023. She is here for an interim visit today. Since this weekend she was feeling very swimmy  headed she says. Everytime she is vertical, she felt very swimmy headed. She was also nauseous and was just sipping on water. When she called here, she was recommended to take dramamine she says. She has been taking dramamine, she felt better this morning. She is now able to eat and hydrate well she says.  She gets the Gosrelin injections in [place].  She is hoping to get BSO once she meets her deductible. She also complains of some breast tenderness in the left breast especially when she palpates it. Rest of the pertinent 10 point ROS reviewed and negative  MEDICAL HISTORY:  Past Medical History:  Diagnosis Date   Asthma    exercise induced asthma   Cancer (HCC) 07/29/2021   left breast   Family history of breast cancer 08/14/2021   Family history of kidney cancer 08/14/2021   Pre-diabetes     SURGICAL HISTORY: Past Surgical History:  Procedure Laterality Date   BIOPSY  03/04/2021   Procedure: BIOPSY;  Surgeon: Quentin Ore, MD;  Location: [facility];  Service: General;;   BREAST BIOPSY Left 07/30/2021   BREAST BIOPSY Right 09/01/2021   BREAST BIOPSY Left 09/05/2021   BREAST LUMPECTOMY WITH RADIOACTIVE SEED AND SENTINEL LYMPH NODE BIOPSY Left 09/11/2021   Procedure: LEFT BREAST LUMPECTOMY WITH RADIOACTIVE SEED X3 AND LEFT SENTINEL LYMPH NODE BIOPSY;  Surgeon: Harriette Bouillon, MD;  Location: [facility];  Service: General;  Laterality: Left;   ESOPHAGOGASTRODUODENOSCOPY N/A 03/04/2021   Procedure: ESOPHAGOGASTRODUODENOSCOPY (EGD);  Surgeon: Quentin Ore, MD;  Location: [facility];  Service: General;  Laterality: N/A;   LAPAROSCOPIC GASTRIC SLEEVE RESECTION N/A 05/02/2021   Procedure: LAPAROSCOPIC GASTRIC SLEEVE RESECTION;  Surgeon: Quentin Ore, MD;  Location: [facility];  Service: General;  Laterality: N/A;   LAPAROSCOPIC VAGINAL HYSTERECTOMY WITH SALPINGECTOMY N/A 10/08/2016   Procedure: LAPAROSCOPIC ASSISTED VAGINAL HYSTERECTOMY WITH RIGHT SALPINGECTOMY LYSIS  OF ADHESIONS;  Surgeon: Reva Bores, MD;  Location: [facility];  Service: Gynecology;  Laterality: N/A;   MYOMECTOMY  2014   RE-EXCISION OF BREAST LUMPECTOMY Left 09/24/2021   Procedure: RE-EXCISION OF LEFT BREAST LUMPECTOMY;  Surgeon: Harriette Bouillon, MD;  Location: [facility];  Service: General;  Laterality: Left;   Right Oophrectomy     UPPER GI ENDOSCOPY N/A 05/02/2021   Procedure: UPPER GI ENDOSCOPY;  Surgeon: Quentin Ore, MD;  Location: [facility];  Service: General;  Laterality: N/A;   WISDOM TOOTH EXTRACTION      SOCIAL HISTORY: Social History   Socioeconomic History   Marital status: Divorced    Spouse name: Not on file   Number of children: 1   Years of education: Not on file   Highest education level: Not on file  Occupational History   Not on file  Tobacco Use   Smoking status: Former    Packs/day: 0.10    Years: 15.00    Additional pack years: 0.00    Total pack years: 1.50    Types: Cigarettes    Quit date: 01/23/2017    Years since quitting: 5.6   Smokeless tobacco: Never  Vaping Use   Vaping Use: Never used  Substance and Sexual Activity   Alcohol use: Not Currently    Comment: none in last year per pt   Drug use: Not Currently   Sexual activity:  Not Currently    Birth control/protection: None  Other Topics Concern   Not on file  Social History Narrative   Work as a Engineer, 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   Has 45 year old son.   Moved from [place] in Sept. 2015   Social Determinants of Health   Financial Resource Strain: Not on file  Food Insecurity: Not on file  Transportation Needs: Not on file  Physical Activity: Not on file  Stress: Not on file  Social Connections: Not on file  Intimate Partner Violence: Not on file    FAMILY HISTORY: Family History  Problem Relation Age of Onset   Arthritis Mother    Breast cancer Mother 70       Lumpectomy   Hyperlipidemia Mother    Hypertension Mother     Kidney cancer Mother 107       Had ablation   Graves' disease Mother        Had thyroid removed   Graves' disease Brother        Deceased    ALLERGIES:  has No Known Allergies.  MEDICATIONS:  Current Outpatient Medications  Medication Sig Dispense Refill   abemaciclib (VERZENIO) 150 MG tablet Take 1 tablet (150 mg total) by mouth 2 (two) times daily. 60 tablet 1   albuterol (VENTOLIN HFA) 108 (90 Base) MCG/ACT inhaler Inhale 1-2 puffs into the lungs every 6 (six) hours as needed for wheezing or shortness of breath. 1 each 0   CALCIUM PO Take 1 tablet by mouth 3 (three) times daily.     cetirizine (ZYRTEC) 10 MG tablet Take 1 tablet (10 mg total) by mouth at bedtime. For allergies (Patient taking differently: Take 10 mg by mouth as needed. For allergies) 90 tablet 0   ergocalciferol (VITAMIN D2) 1.25 MG (50000 UT) capsule Take 1 capsule (50,000 Units total) by mouth once a week. 8 capsule 0   fluticasone (FLONASE) 50 MCG/ACT nasal spray Place 1 spray into both nostrils 2 (two) times daily as needed for allergies or rhinitis. 48 mL 0   linaclotide (LINZESS) 72 MCG capsule Take 72 mcg by mouth daily as needed (constipation).     Multiple Vitamin (MULTI-VITAMIN) tablet Take 1 tablet by mouth daily.     tamoxifen (NOLVADEX) 20 MG tablet Take 1 tablet (20 mg total) by mouth daily. 90 tablet 3   venlafaxine XR (EFFEXOR-XR) 75 MG 24 hr capsule Take 1 capsule (75 mg total) by mouth daily with breakfast. 90 capsule 3   No current facility-administered medications for this visit.    PHYSICAL EXAMINATION: ECOG PERFORMANCE STATUS: 0 - Asymptomatic  Vitals:   09/16/22 0845  BP: 131/74  Pulse: 94  Resp: 16  Temp: 98.2 F (36.8 C)  SpO2: 99%     Filed Weights   09/16/22 0845  Weight: 205 lb 3.2 oz (93.1 kg)     BP 131/74 (BP Location: Left Arm, Patient Position: Sitting)   Pulse 94   Temp 98.2 F (36.8 C) (Temporal)   Resp 16   Ht  (1.676 m)   Wt 205 lb 3.2 oz (93.1 kg)    LMP 09/09/2016 (Approximate)   SpO2 99%   BMI 33.12 kg/m   Physical Exam Constitutional:      Appearance: Normal appearance.     Comments: She appears weak otherwise no complaints.  Chest:     Comments: Breast exam, post radiation changes, no palpable masses otherwise. Musculoskeletal:  Cervical back: Normal range of motion and neck supple. No rigidity.  Lymphadenopathy:     Cervical: No cervical adenopathy.  Neurological:     Mental Status: She is alert.      LABORATORY DATA:  I have reviewed the data as listed Lab Results  Component Value Date   WBC 2.4 (L) 09/04/2022   HGB 10.6 (L) 09/04/2022   HCT 30.7 (L) 09/04/2022   MCV 95.0 09/04/2022   PLT 242 09/04/2022   Lab Results  Component Value Date   NA 137 09/04/2022   K 3.7 09/04/2022   CL 107 09/04/2022   CO2 25 09/04/2022    RADIOGRAPHIC STUDIES: I have personally reviewed the radiological reports and agreed with the findings in the report.  ASSESSMENT AND PLAN:  Malignant neoplasm of overlapping sites of left breast in female, estrogen receptor positive (HCC) This is a very pleasant 47 year old female patient, premenopausal with family history of breast cancer in mom who noticed breast abnormality in her left breast in December point for further investigation.  She had a mammogram, diagnostic mammogram as well as ultrasound which noticed an abnormality at the 12 o'clock position in the left breast measuring around 20 mm in its largest dimension along with a satellite nodule in the same breast at around 11 o'clock position measuring 4 x 3 x 6 mm without any enlarged left axillary lymphadenopathy.   Biopsy from these masses showed invasive mammary carcinoma, grade 2, prognostics ER 50% moderate staining, PR 95% strong staining, KI less than 5% HER2 negative on the larger mass and similar prognostics on the second mass.    Given the intermediate grade, strong ER/PR positivity, low proliferation index and HER2  negative tumor, we discussed about upfront surgery followed by consideration for Oncotype testing. However on the final pathology, it was thought that all her tumor was indeed 1 large mass measuring at least 5 cm, 2 out of 4 sentinel lymph nodes involved with tumor.  Final pathologic staging was PT3N1a.  Prognostics not repeated on the final sample.  Reexcision showed tumor measuring 2 mm.    She refused adjuvant chemotherapy.  She is now on goserelin with tamoxifen and abemaciclib. She is here for an unexpected visit because of some vertigo like symptoms, a day of chills. She is improving, taking dramamine PRN Offered IVF, however she wants to continue oral intake since she feels better She may have a viral URI and transient symptoms related to this. I don't believe this is from the medication or the malignancy. She was encouraged to call us with any worsening symptoms Left breast palpable tenderness is likely post treatment changes, no palpable masses. Most recent mammogram neg for malignancy. RTC as planned in 8 weeks. Labs today.  Total time spent:30 minutes  Thank you for consulting [place] in the care of this patient.  Please not hesitate to contact us with any additional questions or concerns. All questions were answered. The patient knows to call the clinic with any problems, questions or concerns.    Rachel Moulds, MD 09/16/22

## 2022-09-17 ENCOUNTER — Telehealth: Payer: Self-pay | Admitting: Hematology and Oncology

## 2022-09-17 ENCOUNTER — Inpatient Hospital Stay: Payer: BC Managed Care – PPO | Admitting: Hematology and Oncology

## 2022-09-17 NOTE — Telephone Encounter (Signed)
Spoke with patient confirming upcoming appointments  

## 2022-09-21 ENCOUNTER — Telehealth: Payer: Self-pay

## 2022-09-21 ENCOUNTER — Ambulatory Visit
Admission: RE | Admit: 2022-09-21 | Discharge: 2022-09-21 | Disposition: A | Discharge status: Home / Self Care | Payer: BC Managed Care – PPO | Source: Ambulatory Visit | Attending: Adult Health | Admitting: Adult Health

## 2022-09-21 DIAGNOSIS — E2839 Other primary ovarian failure: Secondary | ICD-10-CM

## 2022-09-21 DIAGNOSIS — Z780 Asymptomatic menopausal state: Secondary | ICD-10-CM | POA: Diagnosis not present

## 2022-09-21 DIAGNOSIS — M859 Disorder of bone density and structure, unspecified: Secondary | ICD-10-CM | POA: Diagnosis not present

## 2022-09-21 NOTE — Telephone Encounter (Signed)
-----   Message from Loa Socks, NP sent at 09/21/2022  1:17 PM EDT ----- Bone density testing is normal which is great.  Please let patient know.   ----- Message ----- From: Interface, Rad Results In Sent: 09/21/2022  11:39 AM EDT To: Loa Socks, NP

## 2022-09-21 NOTE — Telephone Encounter (Signed)
Called and given below message. Pt verbalized understanding. 

## 2022-10-02 ENCOUNTER — Inpatient Hospital Stay: Payer: BC Managed Care – PPO | Attending: Hematology and Oncology

## 2022-10-02 ENCOUNTER — Inpatient Hospital Stay: Payer: BC Managed Care – PPO

## 2022-10-02 DIAGNOSIS — Z170 Estrogen receptor positive status [ER+]: Secondary | ICD-10-CM | POA: Diagnosis not present

## 2022-10-02 DIAGNOSIS — C50812 Malignant neoplasm of overlapping sites of left female breast: Secondary | ICD-10-CM

## 2022-10-02 DIAGNOSIS — Z5111 Encounter for antineoplastic chemotherapy: Secondary | ICD-10-CM | POA: Diagnosis not present

## 2022-10-02 LAB — COMPREHENSIVE METABOLIC PANEL
ALT: 15 U/L (ref 0–44)
AST: 21 U/L (ref 15–41)
Albumin: 3.7 g/dL (ref 3.5–5.0)
Alkaline Phosphatase: 54 U/L (ref 38–126)
Anion gap: 9 (ref 5–15)
BUN: 14 mg/dL (ref 6–20)
CO2: 25 mmol/L (ref 22–32)
Calcium: 8.9 mg/dL (ref 8.9–10.3)
Chloride: 105 mmol/L (ref 98–111)
Creatinine, Ser: 1 mg/dL (ref 0.44–1.00)
GFR, Estimated: >60 mL/min (ref >60)
Glucose, Bld: 91 mg/dL (ref 70–99)
Potassium: 3.6 mmol/L (ref 3.5–5.1)
Sodium: 139 mmol/L (ref 135–145)
Total Bilirubin: 0.4 mg/dL (ref 0.3–1.2)
Total Protein: 7.3 g/dL (ref 6.5–8.1)

## 2022-10-02 LAB — CBC WITH DIFFERENTIAL/PLATELET
Abs Immature Granulocytes: 0.01 10*3/uL (ref 0.00–0.07)
Basophils Absolute: 0 10*3/uL (ref 0.0–0.1)
Basophils Relative: 0 %
Eosinophils Absolute: 0.1 10*3/uL (ref 0.0–0.5)
Eosinophils Relative: 3 %
HCT: 31.2 % — ABNORMAL LOW (ref 36.0–46.0)
Hemoglobin: 10.7 g/dL — ABNORMAL LOW (ref 12.0–15.0)
Immature Granulocytes: 0 %
Lymphocytes Relative: 40 %
Lymphs Abs: 0.9 10*3/uL (ref 0.7–4.0)
MCH: 32.5 pg (ref 26.0–34.0)
MCHC: 34.3 g/dL (ref 30.0–36.0)
MCV: 94.8 fL (ref 80.0–100.0)
Monocytes Absolute: 0.3 10*3/uL (ref 0.1–1.0)
Monocytes Relative: 11 %
Neutro Abs: 1.1 10*3/uL — ABNORMAL LOW (ref 1.7–7.7)
Neutrophils Relative %: 46 %
Platelets: 240 10*3/uL (ref 150–400)
RBC: 3.29 MIL/uL — ABNORMAL LOW (ref 3.87–5.11)
RDW: 11.9 % (ref 11.5–15.5)
WBC: 2.3 10*3/uL — ABNORMAL LOW (ref 4.0–10.5)
nRBC: 0 % (ref 0.0–0.2)

## 2022-10-02 MED ORDER — GOSERELIN ACETATE 3.6 MG ~~LOC~~ IMPL
3.6000 mg | DRUG_IMPLANT | Freq: Once | SUBCUTANEOUS | Status: AC
  Administered 2022-10-02: 3.6 mg via SUBCUTANEOUS
  Filled 2022-10-02: qty 3.6

## 2022-10-05 ENCOUNTER — Ambulatory Visit: Payer: BC Managed Care – PPO

## 2022-10-05 ENCOUNTER — Inpatient Hospital Stay: Payer: BC Managed Care – PPO

## 2022-10-05 ENCOUNTER — Ambulatory Visit: Payer: BC Managed Care – PPO | Attending: Surgery

## 2022-10-05 DIAGNOSIS — Z483 Aftercare following surgery for neoplasm: Secondary | ICD-10-CM | POA: Insufficient documentation

## 2022-11-06 ENCOUNTER — Inpatient Hospital Stay: Payer: BC Managed Care – PPO | Attending: Hematology and Oncology

## 2022-11-06 ENCOUNTER — Telehealth: Payer: Self-pay | Admitting: Hematology and Oncology

## 2022-11-06 DIAGNOSIS — Z79810 Long term (current) use of selective estrogen receptor modulators (SERMs): Secondary | ICD-10-CM | POA: Insufficient documentation

## 2022-11-06 DIAGNOSIS — C50812 Malignant neoplasm of overlapping sites of left female breast: Secondary | ICD-10-CM | POA: Insufficient documentation

## 2022-11-06 DIAGNOSIS — Z170 Estrogen receptor positive status [ER+]: Secondary | ICD-10-CM | POA: Insufficient documentation

## 2022-11-06 DIAGNOSIS — Z5111 Encounter for antineoplastic chemotherapy: Secondary | ICD-10-CM | POA: Insufficient documentation

## 2022-11-09 ENCOUNTER — Inpatient Hospital Stay: Payer: BC Managed Care – PPO

## 2022-11-11 NOTE — Assessment & Plan Note (Signed)
This is a very pleasant 47 year old female patient, premenopausal with family history of breast cancer in mom who noticed breast abnormality in her left breast in December point for further investigation.  She had a mammogram, diagnostic mammogram as well as ultrasound which noticed an abnormality at the 12 o'clock position in the left breast measuring around 20 mm in its largest dimension along with a satellite nodule in the same breast at around 11 o'clock position measuring 4 x 3 x 6 mm without any enlarged left axillary lymphadenopathy.   Biopsy from these masses showed invasive mammary carcinoma, grade 2, prognostics ER 50% moderate staining, PR 95% strong staining, KI less than 5% HER2 negative on the larger mass and similar prognostics on the second mass.    Given the intermediate grade, strong ER/PR positivity, low proliferation index and HER2 negative tumor, we discussed about upfront surgery followed by consideration for Oncotype testing. However on the final pathology, it was thought that all her tumor was indeed 1 large mass measuring at least 5 cm, 2 out of 4 sentinel lymph nodes involved with tumor.  Final pathologic staging was PT3N1a.  Prognostics not repeated on the final sample.  Reexcision showed tumor measuring 2 mm.    She refused adjuvant chemotherapy.  She is now on goserelin with tamoxifen and abemaciclib. RTC as planned in 8 weeks. Labs today.

## 2022-11-11 NOTE — Progress Notes (Unsigned)
[facility] CONSULT NOTE  Patient Care Team: Doreene Nest, NP as PCP - General (Nurse Practitioner) Rachel Moulds, MD as Consulting Physician (Hematology and Oncology) Dorothy Puffer, MD as Consulting Physician (Radiation Oncology) Harriette Bouillon, MD as Consulting Physician (General Surgery)  CHIEF COMPLAINTS/PURPOSE OF CONSULTATION:  Newly diagnosed breast cancer  HISTORY OF PRESENTING ILLNESS:   Nichole [name] 47 y.o. female is here because of recent diagnosis of left breast IDC  SUMMARY OF ONCOLOGIC HISTORY: Oncology History  Malignant neoplasm of overlapping sites of left breast in female, estrogen receptor positive (HCC)  07/08/2021 Mammogram    Screening mammogram 07/08/2021 showed possible mass with distortion in the left breast. 07/21/2021 she had a left diagnostic mammogram which showed a suspicious left breast mass at 12 o'clock position suspicious satellite nodule left breast at 11 o'clock position Ultrasound same day showed a 16 x 20 x 12 mm irregular hypoechoic mass left breast at 12 o'clock position 4 cm from the nipple.  There is an adjacent satellite nodule within the left breast 11 o'clock position 3 cm from nipple measuring 4 x 3 x 6 mm.  No enlarged left axillary lymphadenopathy    07/30/2021 Pathology Results   Pathology from left breast mass 11:00, 3 cm from nipple showed invasive mammary carcinoma grade 2 prognostics ER 50% moderate staining, PR 95% strong staining, Ki-67 less than 5% and HER2 negative.  The second mass at 11:00 also showed similar prognostics and is grade 2.   08/11/2021 Initial Diagnosis   Malignant neoplasm of overlapping sites of left breast in female, estrogen receptor positive (HCC)   08/11/2021 Cancer Staging   Staging form: Breast, AJCC 8th Edition - Pathologic: Stage IB (pT2, pN1, cM0, G2, ER+, PR+, HER2-) - Signed by Rachel Moulds, MD on 10/02/2021 Histologic grading system: 3 grade system   08/29/2021 Genetic  Testing   Negative hereditary cancer genetic testing: no pathogenic variants detected in [facility] CancerNext-Expanded +RNAinsight Panel.  Report date is 08/29/2021.   The CancerNext-Expanded gene panel offered by [facility] and includes sequencing, rearrangement, and RNA analysis for the following 77 genes: AIP, ALK, APC, ATM, AXIN2, BAP1, BARD1, BLM, BMPR1A, BRCA1, BRCA2, BRIP1, CDC73, CDH1, CDK4, CDKN1B, CDKN2A, CHEK2, CTNNA1, DICER1, FANCC, FH, FLCN, GALNT12, KIF1B, LZTR1, MAX, MEN1, MET, MLH1, MSH2, MSH3, MSH6, MUTYH, NBN, NF1, NF2, NTHL1, PALB2, PHOX2B, PMS2, POT1, PRKAR1A, PTCH1, PTEN, RAD51C, RAD51D, RB1, RECQL, RET, SDHA, SDHAF2, SDHB, SDHC, SDHD, SMAD4, SMARCA4, SMARCB1, SMARCE1, STK11, SUFU, TMEM127, TP53, TSC1, TSC2, VHL and XRCC2 (sequencing and deletion/duplication); EGFR, EGLN1, HOXB13, KIT, MITF, PDGFRA, POLD1, and POLE (sequencing only); EPCAM and GREM1 (deletion/duplication only).    09/11/2021 Surgery   Left breast lumpectomy showed grade 2 invasive lobular carcinoma, lobular carcinoma in situ showing ductal involvement, invasive tumor focally present in the lateral margin, additional margins very close.  Tumor size greater than 5 cm. Changes consistent with prior biopsies.  Fibrocystic changes including extensive stromal fibrosis and adenosis.  Left axillary lymph node also confirmed metastatic lobular carcinoma.  Final pathologic staging was [id number]   11/03/2021 - 12/19/2021 Radiation Therapy   Site Technique Total Dose (Gy) Dose per Fx (Gy) Completed Fx Beam Energies  Breast, Left: Breast_L 3D 50.4/50.4 1.8 28/28 6X  Breast, Left: Breast_L_SCLV 3D 50.4/50.4 1.8 28/28 6X, 10X  Breast, Left: Breast_L_Bst 3D 10/10 2 5/5 6X     12/2021 -  Anti-estrogen oral therapy   Tamoxifen + Zoladex    Age at first child birth : 4  Nichole [name]  is here for a follow-up on Tamoxifen and goserelin started in August 2023. She hasnt been taking verzenio until the past 2 weeks. She says he has been  having nausea, lack of mental clarity, fogginess, joint pain, lack of concentration and extreme fatigue.  She says on some days, she can barely walk because her joints hurt so bad.  She is overall not pleased with how things are going and is hoping to feel better.  She is considering bilateral salpingo-oophorectomy to avoid the injections. Rest of the pertinent 10 point ROS reviewed and negative  MEDICAL HISTORY:  Past Medical History:  Diagnosis Date   Asthma    exercise induced asthma   Cancer (HCC) 07/29/2021   left breast   Family history of breast cancer 08/14/2021   Family history of kidney cancer 08/14/2021   Pre-diabetes     SURGICAL HISTORY: Past Surgical History:  Procedure Laterality Date   BIOPSY  03/04/2021   Procedure: BIOPSY;  Surgeon: Quentin Ore, MD;  Location: [facility];  Service: General;;   BREAST BIOPSY Left 07/30/2021   BREAST BIOPSY Right 09/01/2021   BREAST BIOPSY Left 09/05/2021   BREAST LUMPECTOMY WITH RADIOACTIVE SEED AND SENTINEL LYMPH NODE BIOPSY Left 09/11/2021   Procedure: LEFT BREAST LUMPECTOMY WITH RADIOACTIVE SEED X3 AND LEFT SENTINEL LYMPH NODE BIOPSY;  Surgeon: Harriette Bouillon, MD;  Location: [facility];  Service: General;  Laterality: Left;   ESOPHAGOGASTRODUODENOSCOPY N/A 03/04/2021   Procedure: ESOPHAGOGASTRODUODENOSCOPY (EGD);  Surgeon: Quentin Ore, MD;  Location: [facility];  Service: General;  Laterality: N/A;   LAPAROSCOPIC GASTRIC SLEEVE RESECTION N/A 05/02/2021   Procedure: LAPAROSCOPIC GASTRIC SLEEVE RESECTION;  Surgeon: Quentin Ore, MD;  Location: [facility];  Service: General;  Laterality: N/A;   LAPAROSCOPIC VAGINAL HYSTERECTOMY WITH SALPINGECTOMY N/A 10/08/2016   Procedure: LAPAROSCOPIC ASSISTED VAGINAL HYSTERECTOMY WITH RIGHT SALPINGECTOMY LYSIS OF ADHESIONS;  Surgeon: Reva Bores, MD;  Location: [facility];  Service: Gynecology;  Laterality: N/A;   MYOMECTOMY  2014   RE-EXCISION OF BREAST LUMPECTOMY Left 09/24/2021    Procedure: RE-EXCISION OF LEFT BREAST LUMPECTOMY;  Surgeon: Harriette Bouillon, MD;  Location: [facility];  Service: General;  Laterality: Left;   Right Oophrectomy     UPPER GI ENDOSCOPY N/A 05/02/2021   Procedure: UPPER GI ENDOSCOPY;  Surgeon: Quentin Ore, MD;  Location: [facility];  Service: General;  Laterality: N/A;   WISDOM TOOTH EXTRACTION      SOCIAL HISTORY: Social History   Socioeconomic History   Marital status: Divorced    Spouse name: Not on file   Number of children: 1   Years of education: Not on file   Highest education level: Not on file  Occupational History   Not on file  Tobacco Use   Smoking status: Former    Packs/day: 0.10    Years: 15.00    Additional pack years: 0.00    Total pack years: 1.50    Types: Cigarettes    Quit date: 01/23/2017    Years since quitting: 5.8   Smokeless tobacco: Never  Vaping Use   Vaping Use: Never used  Substance and Sexual Activity   Alcohol use: Not Currently    Comment: none in last year per pt   Drug use: Not Currently   Sexual activity: Not Currently    Birth control/protection: None  Other Topics Concern   Not on file  Social History Narrative   Work as a Engineer, civil (consulting) at [employer].  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   Has 4 year old son.   Moved from [place] in Sept. 2015   Social Determinants of Health   Financial Resource Strain: Not on file  Food Insecurity: Not on file  Transportation Needs: Not on file  Physical Activity: Not on file  Stress: Not on file  Social Connections: Not on file  Intimate Partner Violence: Not on file    FAMILY HISTORY: Family History  Problem Relation Age of Onset   Arthritis Mother    Breast cancer Mother 75       Lumpectomy   Hyperlipidemia Mother    Hypertension Mother    Kidney cancer Mother 53       Had ablation   Graves' disease Mother        Had thyroid removed   Graves' disease Brother        Deceased    ALLERGIES:  has No  Known Allergies.  MEDICATIONS:  Current Outpatient Medications  Medication Sig Dispense Refill   abemaciclib (VERZENIO) 100 MG tablet Take 1 tablet (100 mg total) by mouth 2 (two) times daily. 56 tablet 1   albuterol (VENTOLIN HFA) 108 (90 Base) MCG/ACT inhaler Inhale 1-2 puffs into the lungs every 6 (six) hours as needed for wheezing or shortness of breath. 1 each 0   CALCIUM PO Take 1 tablet by mouth 3 (three) times daily.     cetirizine (ZYRTEC) 10 MG tablet Take 1 tablet (10 mg total) by mouth at bedtime. For allergies (Patient taking differently: Take 10 mg by mouth as needed. For allergies) 90 tablet 0   ergocalciferol (VITAMIN D2) 1.25 MG (50000 UT) capsule Take 1 capsule (50,000 Units total) by mouth once a week. 8 capsule 0   fluticasone (FLONASE) 50 MCG/ACT nasal spray Place 1 spray into both nostrils 2 (two) times daily as needed for allergies or rhinitis. 48 mL 0   linaclotide (LINZESS) 72 MCG capsule Take 72 mcg by mouth daily as needed (constipation).     Multiple Vitamin (MULTI-VITAMIN) tablet Take 1 tablet by mouth daily.     tamoxifen (NOLVADEX) 20 MG tablet Take 1 tablet (20 mg total) by mouth daily. 90 tablet 3   venlafaxine XR (EFFEXOR-XR) 75 MG 24 hr capsule Take 1 capsule (75 mg total) by mouth daily with breakfast. 90 capsule 3   No current facility-administered medications for this visit.    PHYSICAL EXAMINATION: ECOG PERFORMANCE STATUS: 0 - Asymptomatic  Vitals:   11/12/22 0948  BP: 132/82  Pulse: 82  Resp: 18  Temp: 97.9 F (36.6 C)  SpO2: 97%     Filed Weights   11/12/22 0948  Weight: 218 lb (98.9 kg)     BP 132/82 (BP Location: Left Arm, Patient Position: Sitting)   Pulse 82   Temp 97.9 F (36.6 C) (Temporal)   Resp 18   Ht 5\' 6"  (1.676 m)   Wt 218 lb (98.9 kg)   LMP 09/09/2016 (Approximate)   SpO2 97%   BMI 35.19 kg/m   Physical Exam Constitutional:      Appearance: Normal appearance.     Comments: No acute distress  Chest:      Comments: Breast exam, post radiation changes, no palpable masses otherwise. Musculoskeletal:     Cervical back: Normal range of motion and neck supple. No rigidity.  Lymphadenopathy:     Cervical: No cervical adenopathy.  Neurological:     Mental Status: She is alert.    LABORATORY  DATA:  I have reviewed the data as listed Lab Results  Component Value Date   WBC 2.9 (L) 11/12/2022   HGB 12.0 11/12/2022   HCT 35.2 (L) 11/12/2022   MCV 94.4 11/12/2022   PLT 200 11/12/2022   Lab Results  Component Value Date   NA 142 11/12/2022   K 3.9 11/12/2022   CL 108 11/12/2022   CO2 29 11/12/2022    RADIOGRAPHIC STUDIES: I have personally reviewed the radiological reports and agreed with the findings in the report.  ASSESSMENT AND PLAN:  Malignant neoplasm of overlapping sites of left breast in female, estrogen receptor positive (HCC) This is a very pleasant 47 year old female patient, premenopausal with family history of breast cancer in mom who noticed breast abnormality in her left breast in December point for further investigation.  She had a mammogram, diagnostic mammogram as well as ultrasound which noticed an abnormality at the 12 o'clock position in the left breast measuring around 20 mm in its largest dimension along with a satellite nodule in the same breast at around 11 o'clock position measuring 4 x 3 x 6 mm without any enlarged left axillary lymphadenopathy.   Biopsy from these masses showed invasive mammary carcinoma, grade 2, prognostics ER 50% moderate staining, PR 95% strong staining, KI less than 5% HER2 negative on the larger mass and similar prognostics on the second mass.    Given the intermediate grade, strong ER/PR positivity, low proliferation index and HER2 negative tumor, we discussed about upfront surgery followed by consideration for Oncotype testing. However on the final pathology, it was thought that all her tumor was indeed 1 large mass measuring at least 5 cm, 2  out of 4 sentinel lymph nodes involved with tumor.  Final pathologic staging was [id number].  Prognostics not repeated on the final sample.  Reexcision showed tumor measuring 2 mm.    She refused adjuvant chemotherapy.  She is now on goserelin with tamoxifen and abemaciclib.  She however has stopped taking Verzenio in the past 2-1/2 weeks, she states the nausea is out of control.  She has also been feeling very foggy, no concentration, some joint pains and wonders if this is also related to [employer].  We have discussed today that the symptoms that she describes are likely secondary to the Zoladex/ovarian suppression.  Nausea can indeed be related to Verzenio, she felt better when she was taking 100 mg hence will reduce the dose back to 100 mg twice daily for Verzenio.  I have encouraged her to meet the gynecologist to discuss about BSO if this is what she would like to do.  She will return to clinic in 8 weeks. RTC as planned in 8 weeks. Labs today.  Total time spent:30 minutes  Thank you for consulting [place] in the care of this patient.  Please not hesitate to contact us with any additional questions or concerns. All questions were answered. The patient knows to call the clinic with any problems, questions or concerns.    Rachel Moulds, MD 11/12/22

## 2022-11-12 ENCOUNTER — Inpatient Hospital Stay: Payer: BC Managed Care – PPO | Admitting: Hematology and Oncology

## 2022-11-12 ENCOUNTER — Other Ambulatory Visit: Payer: Self-pay

## 2022-11-12 ENCOUNTER — Inpatient Hospital Stay: Payer: BC Managed Care – PPO

## 2022-11-12 VITALS — BP 132/82 | HR 82 | Temp 97.9°F | Resp 18 | Ht 66.0 in | Wt 218.0 lb

## 2022-11-12 DIAGNOSIS — C50812 Malignant neoplasm of overlapping sites of left female breast: Secondary | ICD-10-CM

## 2022-11-12 DIAGNOSIS — Z79810 Long term (current) use of selective estrogen receptor modulators (SERMs): Secondary | ICD-10-CM | POA: Diagnosis not present

## 2022-11-12 DIAGNOSIS — Z5111 Encounter for antineoplastic chemotherapy: Secondary | ICD-10-CM | POA: Diagnosis not present

## 2022-11-12 DIAGNOSIS — Z170 Estrogen receptor positive status [ER+]: Secondary | ICD-10-CM

## 2022-11-12 LAB — CBC WITH DIFFERENTIAL (CANCER CENTER ONLY)
Abs Immature Granulocytes: 0.01 10*3/uL (ref 0.00–0.07)
Basophils Absolute: 0 10*3/uL (ref 0.0–0.1)
Basophils Relative: 0 %
Eosinophils Absolute: 0.1 10*3/uL (ref 0.0–0.5)
Eosinophils Relative: 2 %
HCT: 35.2 % — ABNORMAL LOW (ref 36.0–46.0)
Hemoglobin: 12 g/dL (ref 12.0–15.0)
Immature Granulocytes: 0 %
Lymphocytes Relative: 35 %
Lymphs Abs: 1 10*3/uL (ref 0.7–4.0)
MCH: 32.2 pg (ref 26.0–34.0)
MCHC: 34.1 g/dL (ref 30.0–36.0)
MCV: 94.4 fL (ref 80.0–100.0)
Monocytes Absolute: 0.3 10*3/uL (ref 0.1–1.0)
Monocytes Relative: 10 %
Neutro Abs: 1.5 10*3/uL — ABNORMAL LOW (ref 1.7–7.7)
Neutrophils Relative %: 53 %
Platelet Count: 200 10*3/uL (ref 150–400)
RBC: 3.73 MIL/uL — ABNORMAL LOW (ref 3.87–5.11)
RDW: 11.4 % — ABNORMAL LOW (ref 11.5–15.5)
WBC Count: 2.9 10*3/uL — ABNORMAL LOW (ref 4.0–10.5)
nRBC: 0 % (ref 0.0–0.2)

## 2022-11-12 LAB — CMP (CANCER CENTER ONLY)
ALT: 23 U/L (ref 0–44)
AST: 18 U/L (ref 15–41)
Albumin: 3.8 g/dL (ref 3.5–5.0)
Alkaline Phosphatase: 65 U/L (ref 38–126)
Anion gap: 5 (ref 5–15)
BUN: 13 mg/dL (ref 6–20)
CO2: 29 mmol/L (ref 22–32)
Calcium: 9.5 mg/dL (ref 8.9–10.3)
Chloride: 108 mmol/L (ref 98–111)
Creatinine: 0.9 mg/dL (ref 0.44–1.00)
GFR, Estimated: >60 mL/min (ref >60)
Glucose, Bld: 115 mg/dL — ABNORMAL HIGH (ref 70–99)
Potassium: 3.9 mmol/L (ref 3.5–5.1)
Sodium: 142 mmol/L (ref 135–145)
Total Bilirubin: 0.3 mg/dL (ref 0.3–1.2)
Total Protein: 6.9 g/dL (ref 6.5–8.1)

## 2022-11-12 MED ORDER — GOSERELIN ACETATE 3.6 MG ~~LOC~~ IMPL
3.6000 mg | DRUG_IMPLANT | Freq: Once | SUBCUTANEOUS | Status: AC
  Administered 2022-11-12: 3.6 mg via SUBCUTANEOUS
  Filled 2022-11-12: qty 3.6

## 2022-11-12 MED ORDER — ABEMACICLIB 100 MG PO TABS: 100.0000 mg | ORAL_TABLET | Freq: Two times a day (BID) | ORAL | 1 refills | Status: DC

## 2022-11-13 ENCOUNTER — Other Ambulatory Visit: Payer: Self-pay | Admitting: Hematology and Oncology

## 2022-11-13 ENCOUNTER — Other Ambulatory Visit: Payer: Self-pay | Admitting: Pharmacist

## 2022-11-13 ENCOUNTER — Other Ambulatory Visit (HOSPITAL_COMMUNITY): Payer: Self-pay

## 2022-11-13 DIAGNOSIS — C50812 Malignant neoplasm of overlapping sites of left female breast: Secondary | ICD-10-CM

## 2022-11-13 MED ORDER — ABEMACICLIB 100 MG PO TABS: 100.0000 mg | ORAL_TABLET | Freq: Two times a day (BID) | ORAL | 1 refills | Status: DC

## 2022-11-13 NOTE — Progress Notes (Signed)
Goserelin signed.

## 2022-11-13 NOTE — Progress Notes (Signed)
Oral Oncology Pharmacist Encounter   Patient's insurance requires them to fill through [facility]. Verzenio prescription has been redirected for dispensing.    Lenord Carbo, PharmD, BCPS, [facility] Hematology/Oncology Clinical Pharmacist [facility] and [facility] Oral Chemotherapy Navigation Clinics [phone] 11/13/2022 9:33 AM

## 2022-11-30 ENCOUNTER — Encounter: Payer: Self-pay | Admitting: Hematology and Oncology

## 2022-11-30 ENCOUNTER — Ambulatory Visit: Payer: BC Managed Care – PPO | Admitting: Radiology

## 2022-11-30 ENCOUNTER — Encounter: Payer: Self-pay | Admitting: Radiology

## 2022-11-30 DIAGNOSIS — Z170 Estrogen receptor positive status [ER+]: Secondary | ICD-10-CM | POA: Diagnosis not present

## 2022-11-30 DIAGNOSIS — C50812 Malignant neoplasm of overlapping sites of left female breast: Secondary | ICD-10-CM | POA: Diagnosis not present

## 2022-11-30 DIAGNOSIS — Z803 Family history of malignant neoplasm of breast: Secondary | ICD-10-CM

## 2022-11-30 NOTE — Progress Notes (Signed)
   Nichole [name] 20-May-1976 [id number]   History:  47 y.o. G2P1 presents for a consult re: left oophorectomy. Hx of left breast cancer with lumpectomy 2023. Followed by Dr Al Pimple. Hx of negative [facility] genetics screening 2023. She is currently receiving Zoladex every 4 weeks and is interested in stopping this medication once her left ovary is removed.   Gynecologic History Hysterectomy: fibroids 2018    Past medical history, past surgical history, family history and social history were all reviewed and documented in the EPIC chart.  ROS:  A ROS was performed and pertinent positives and negatives are included.  Exam: There were no vitals filed for this visit. There is no height or weight on file to calculate BMI.   Physical Exam Vitals and nursing note reviewed.  Constitutional:      Appearance: Normal appearance. She is normal weight.  Pulmonary:     Effort: Pulmonary effort is normal.  Neurological:     Mental Status: She is alert.  Psychiatric:        Mood and Affect: Mood normal.        Thought Content: Thought content normal.        Judgment: Judgment normal.      Assessment/Plan:   1. Malignant neoplasm of overlapping sites of left breast in female, estrogen receptor positive (HCC)  2. Family history of breast cancer Will schedule for surgical consult with Dr Karma Greaser to further discuss left oophorectomy.  Negative genetic testing 08/29/21    Tanda Rockers WHNP-BC 8:37 AM 11/30/2022

## 2022-12-04 ENCOUNTER — Inpatient Hospital Stay: Payer: BC Managed Care – PPO

## 2022-12-04 ENCOUNTER — Inpatient Hospital Stay: Payer: BC Managed Care – PPO | Attending: Hematology and Oncology

## 2022-12-05 IMAGING — MG MM BREAST LOCALIZATION CLIP
4 series · 4 of 12 positions shown · non-contrast
Comparison: Previous exam(s).

CLINICAL DATA: Status post ultrasound-guided biopsy of the left
breast.

EXAM:
3D DIAGNOSTIC LEFT MAMMOGRAM POST ULTRASOUND BIOPSY

[L ML synth-2D]
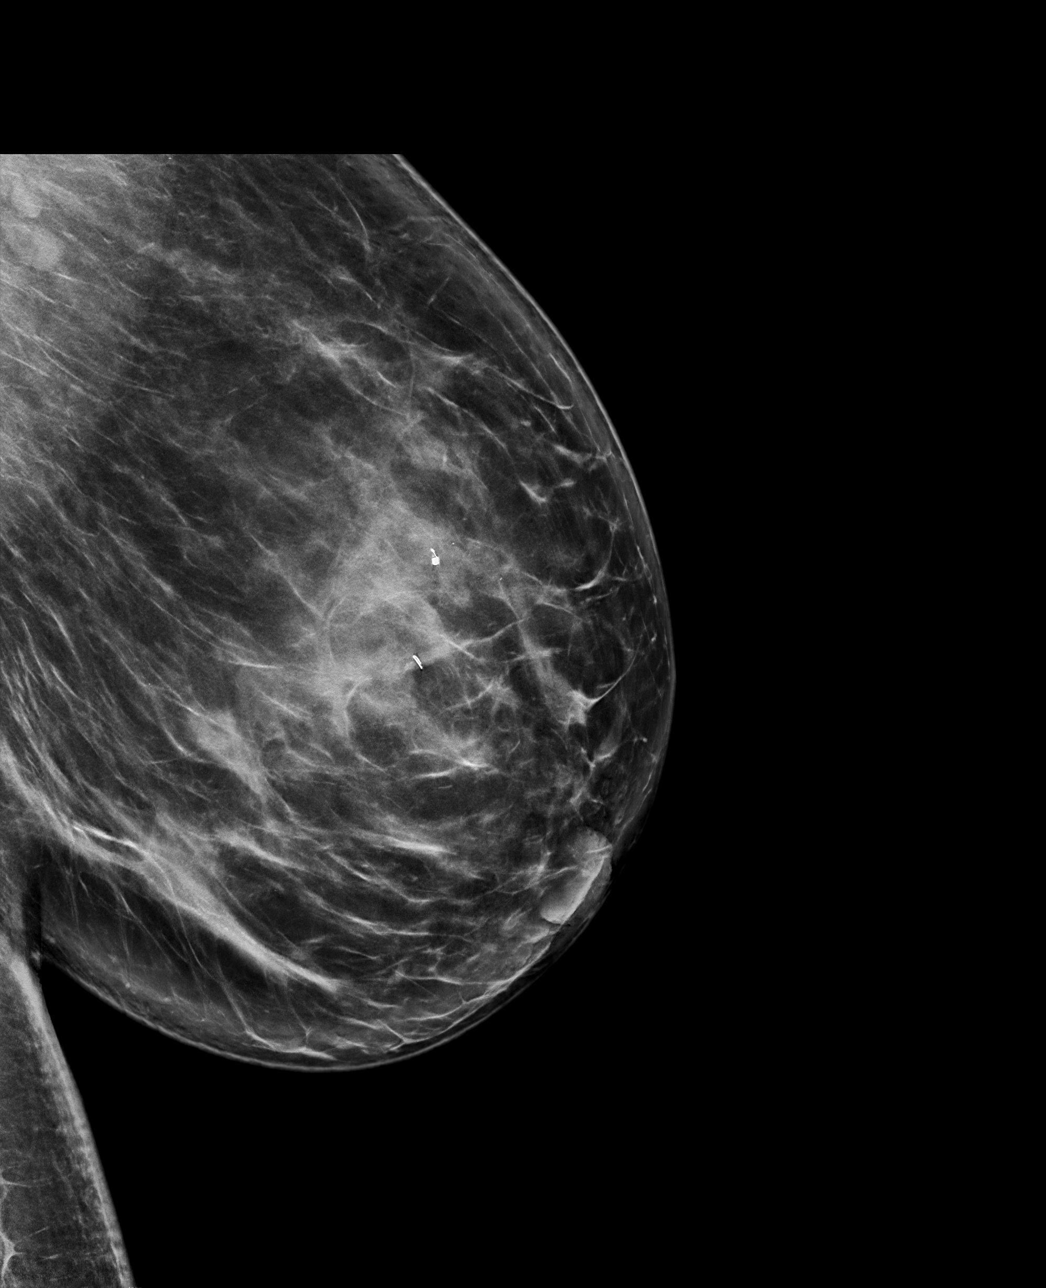

[L CC synth-2D]
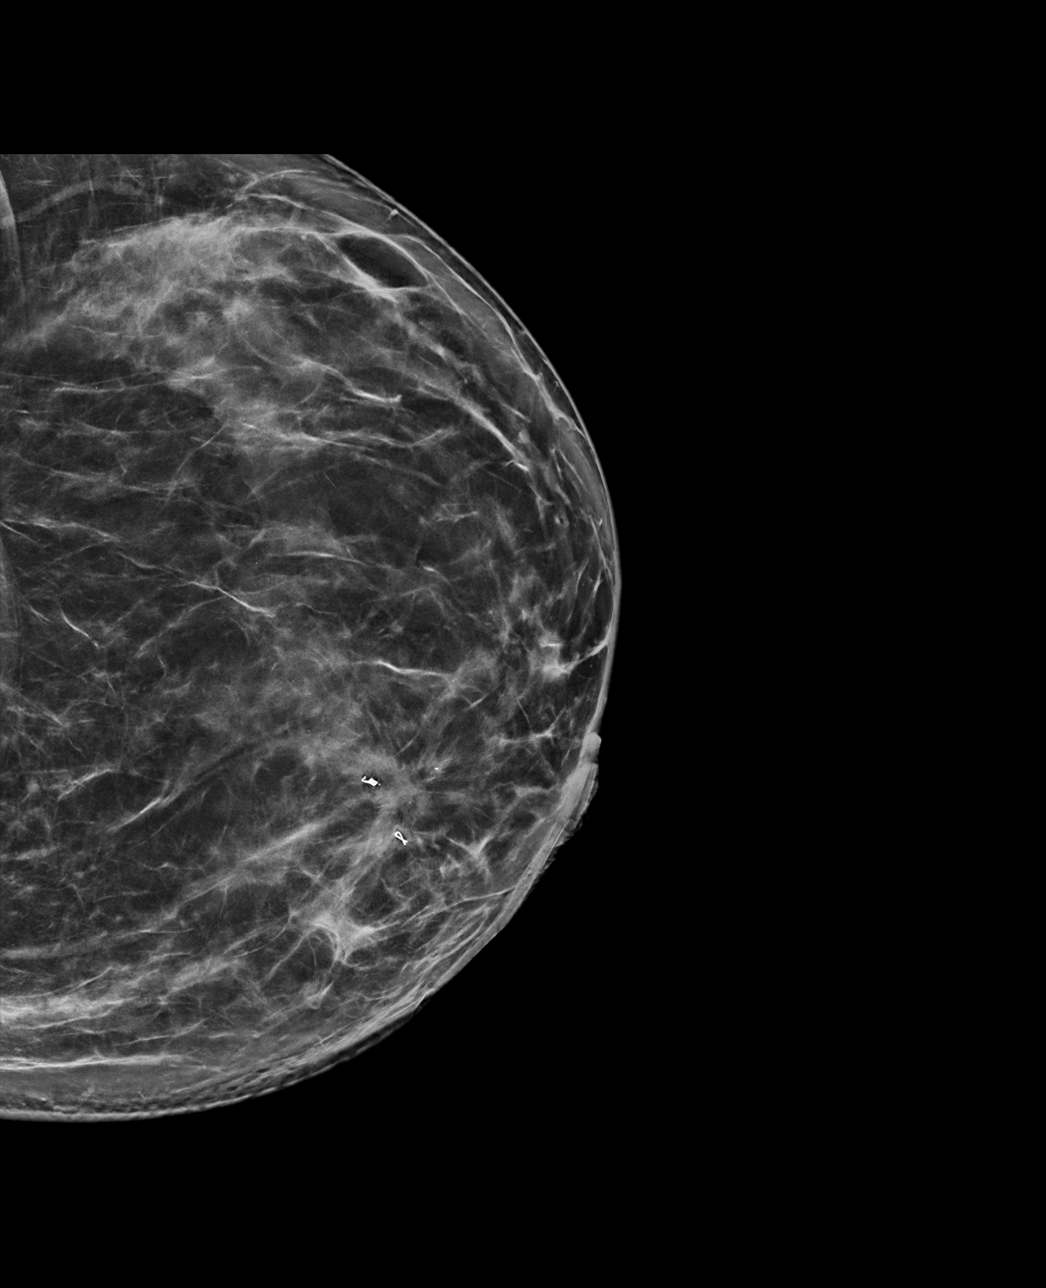

[L CC tomo · tomo slice 49/97.0]
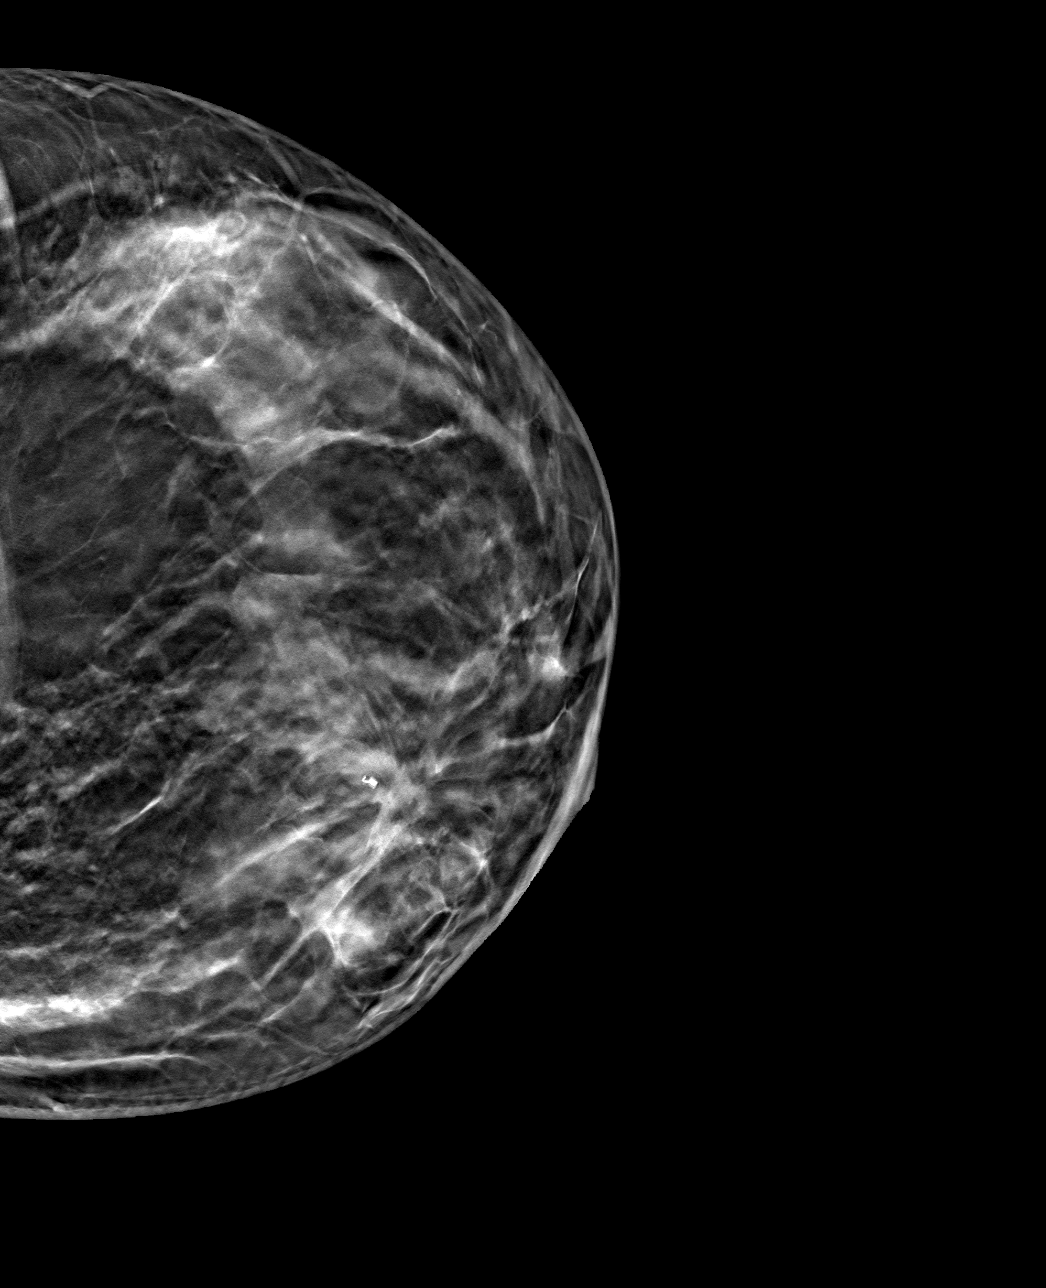

[L ML tomo · tomo slice 55/110.0]
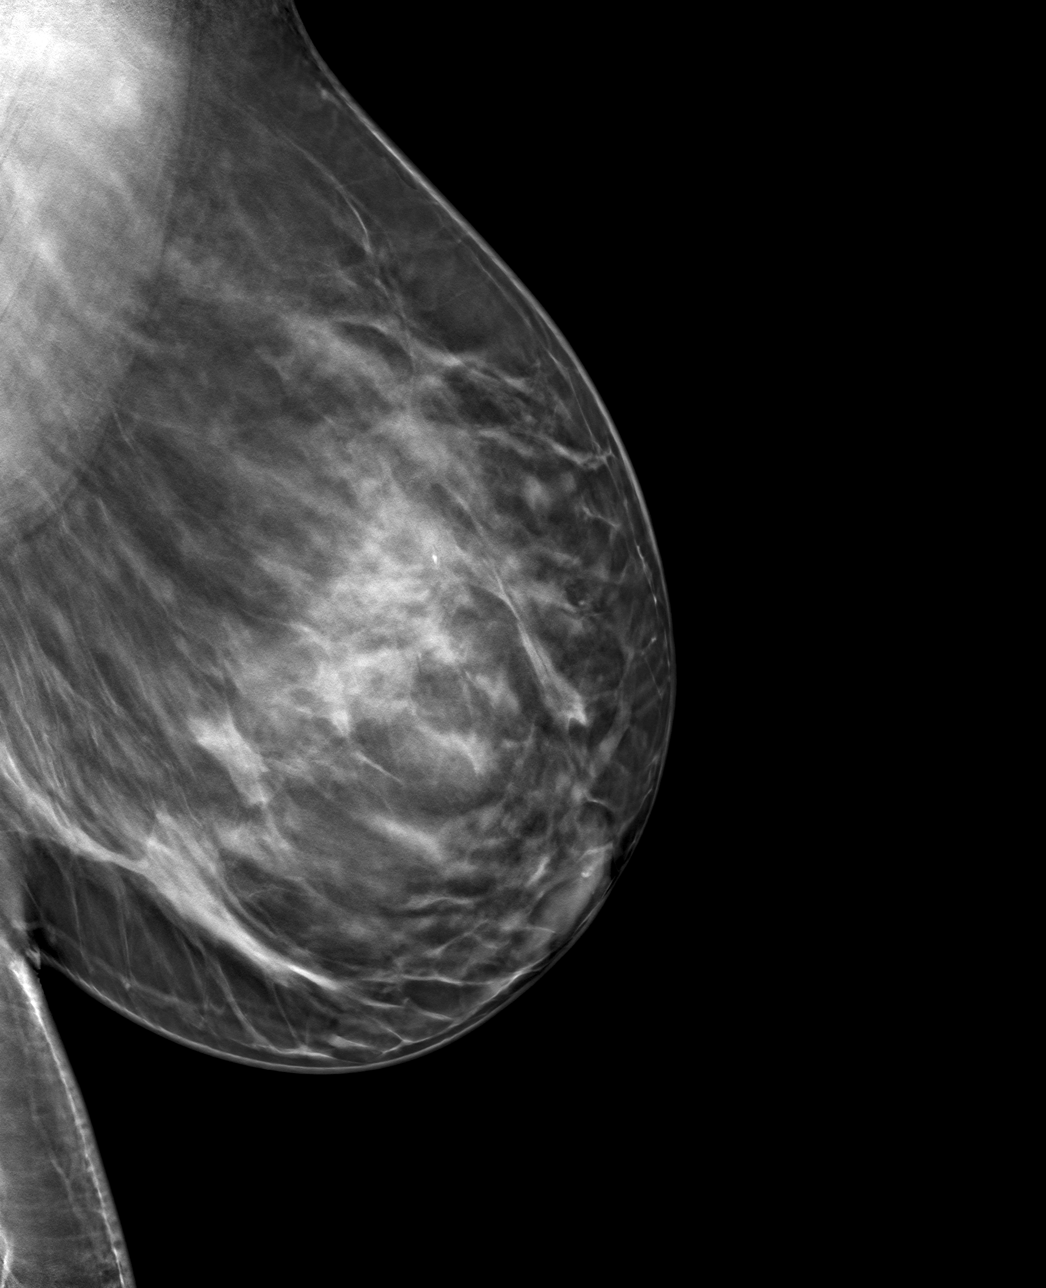

[4 of 12 positions shown; findings below may reference images not displayed]

FINDINGS: 3D Mammographic images were obtained following ultrasound guided
biopsy of the left breast. The biopsy marking clips are in expected
position at the site of biopsy.
IMPRESSION: Appropriate positioning of the ribbon and coil shaped biopsy marking
clips at the site of biopsy in the upper inner left breast.

Final Assessment: Post Procedure Mammograms for Marker Placement

## 2022-12-05 IMAGING — US US BREAST BX W LOC DEV 1ST LESION IMG BX SPEC US GUIDE*L*
1 series · 12 of 15 positions shown · non-contrast
Comparison: Previous exam(s).
COMPARISON: Previous exam(s).

Addendum:
CLINICAL DATA: 46-year-old female with suspicious left breast
masses.

EXAM:
ULTRASOUND GUIDED LEFT BREAST CORE NEEDLE BIOPSY x2

[Series 1: us breast bx w loc dev 1st lesion img bx spec us g · 0.07mm/px · 12 of 15 slices shown]
[im 1/15]
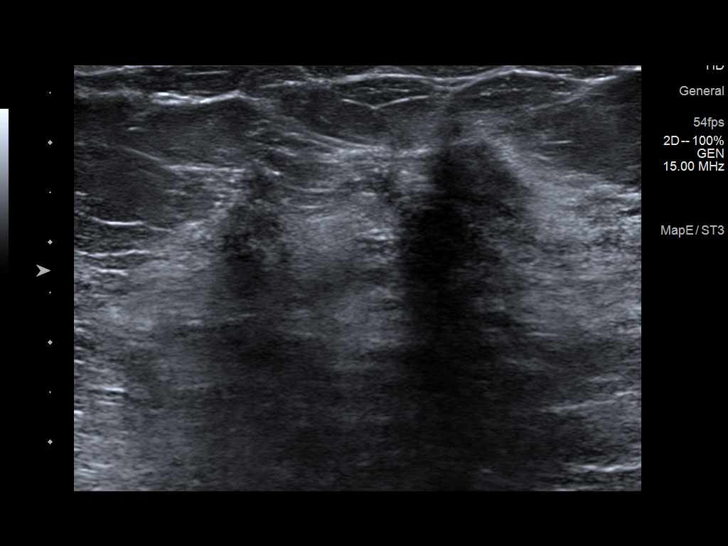
[im 2/15]
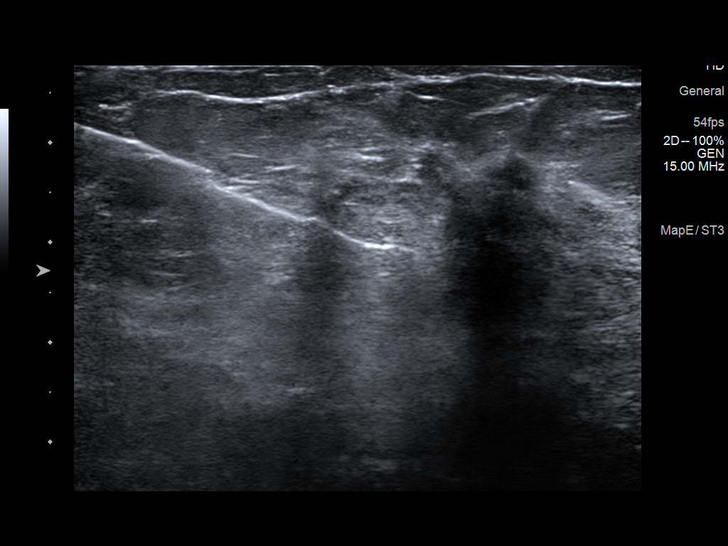
[im 4/15]
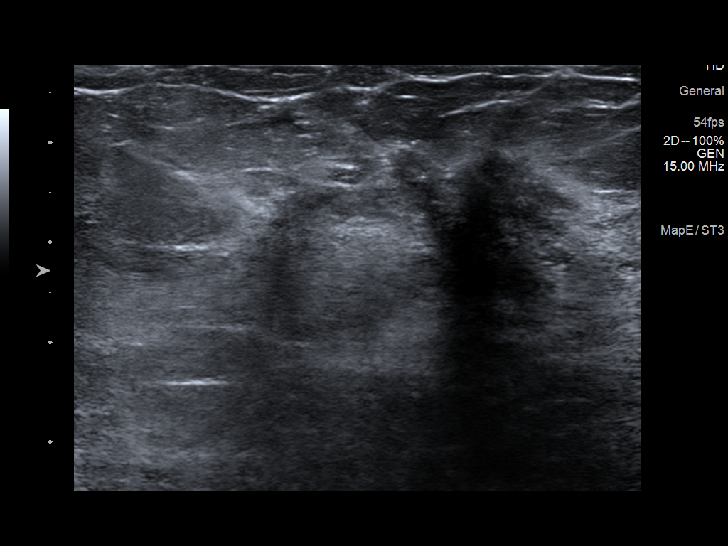
[im 5/15]
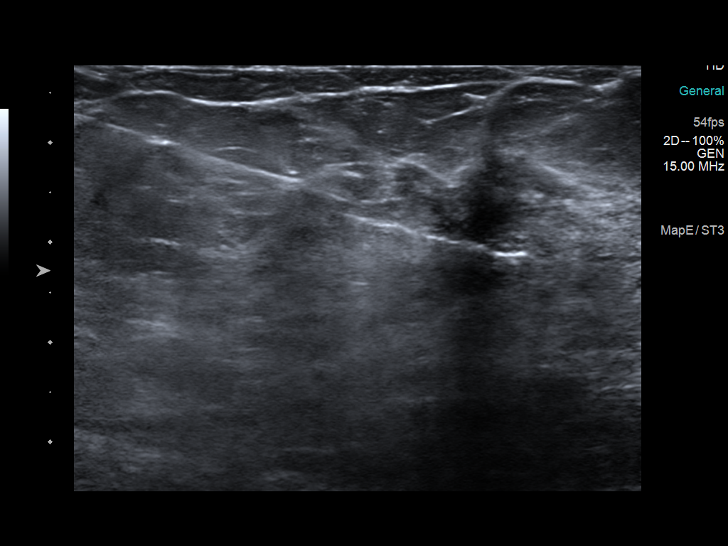
[im 6/15]
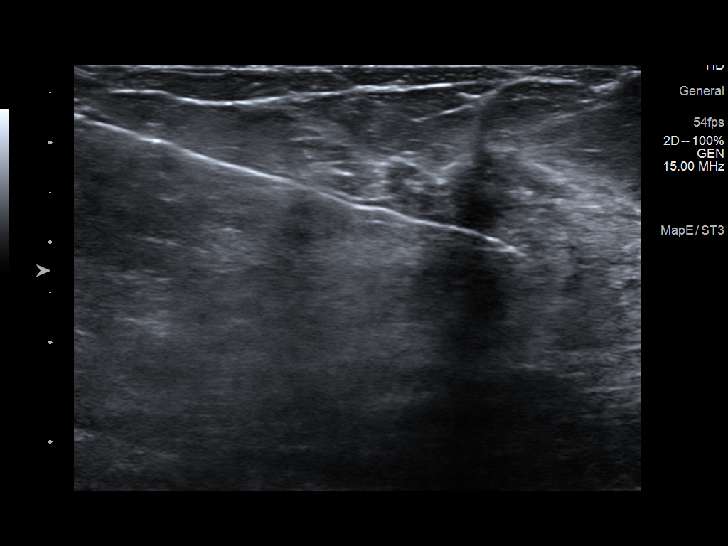
[im 7/15]
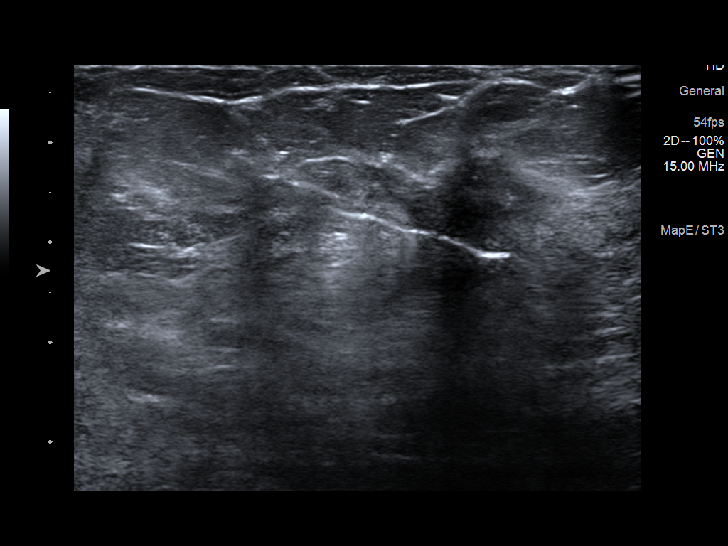
[im 9/15]
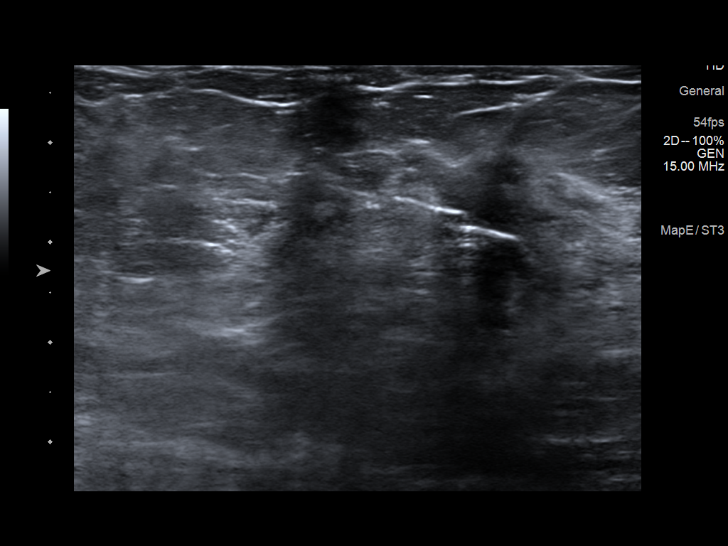
[im 10/15]
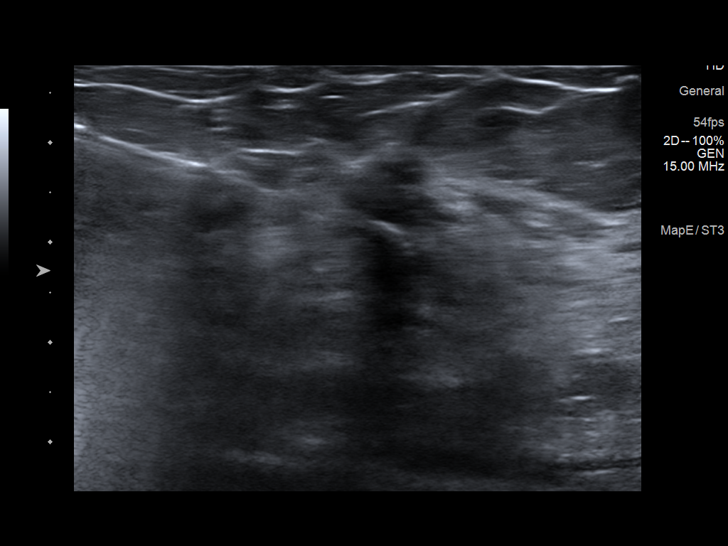
[im 11/15]
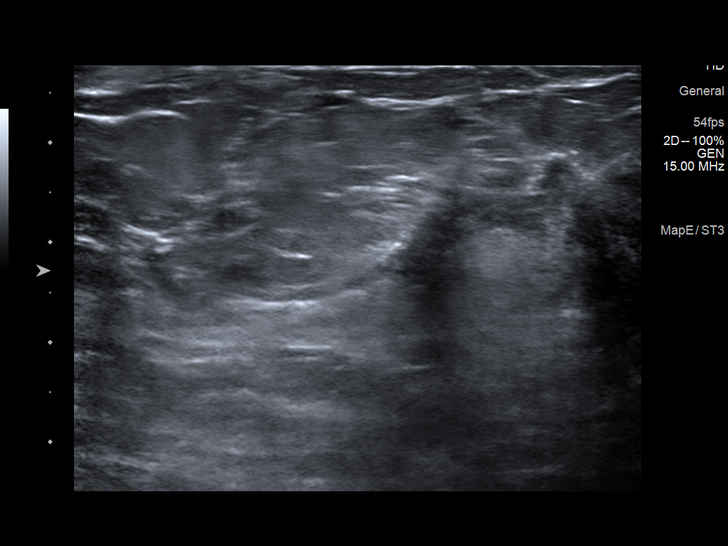
[im 12/15]
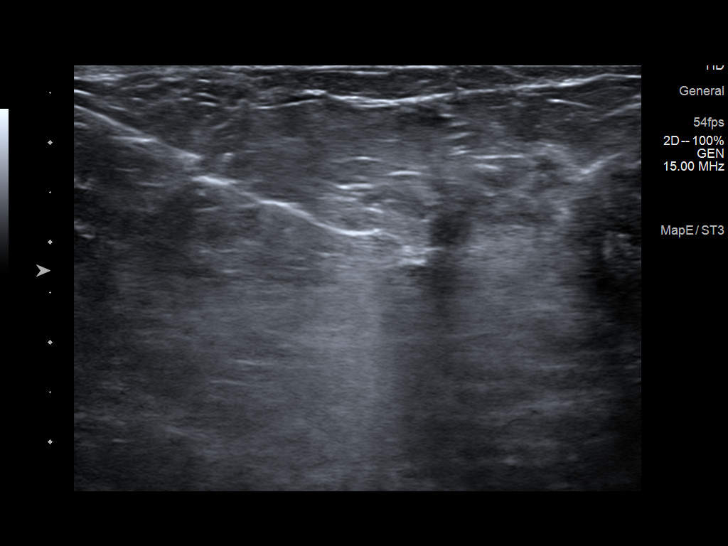
[im 14/15]
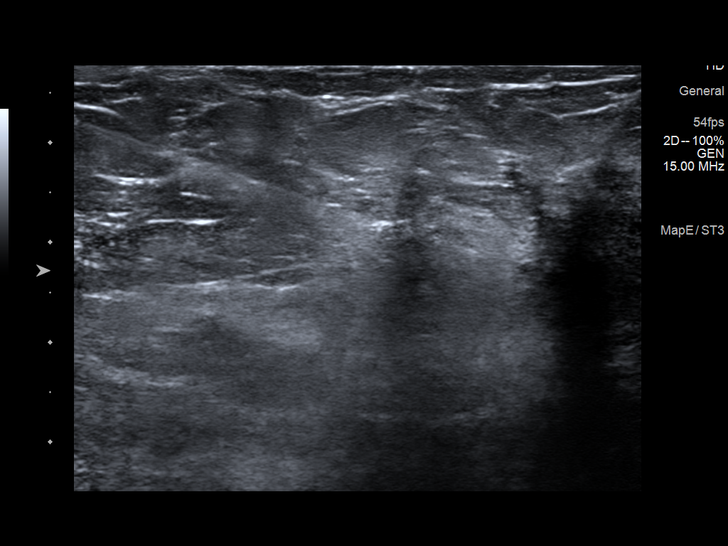
[im 15/15]
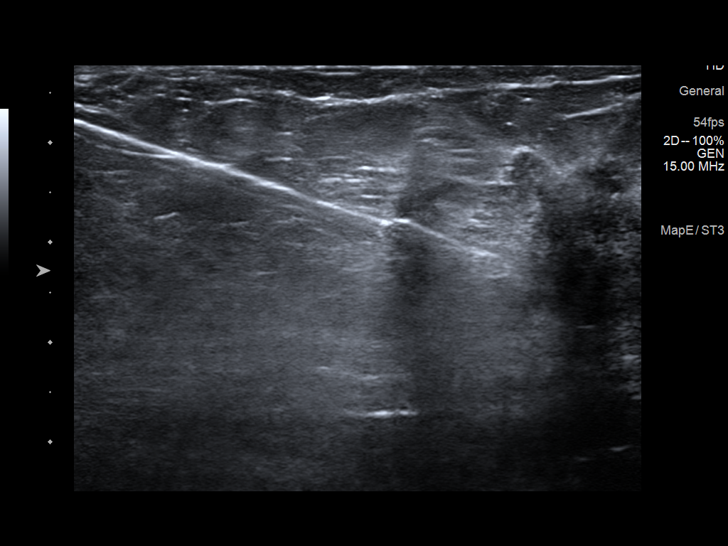

[12 of 15 positions shown; findings below may reference images not displayed]



Lesion quadrant: Upper inner quadrant

Using sterile technique and 1% Lidocaine as local anesthetic, under
direct ultrasound visualization, a 14 gauge Paa Solo device was
used to perform biopsy of a satellite mass at the 11 o'clock
position 3 cm from the nipple using a inferior approach. At the
conclusion of the procedure a ribbon shaped tissue marker clip was
deployed into the biopsy cavity.

Lesion quadrant: Upper inner quadrant

Using sterile technique and 1% Lidocaine as local anesthetic, under
direct ultrasound visualization, a 14 gauge Paa Solo device was
used to perform biopsy of the primary mass at the 12 o'clock
position using a inferior approach. At the conclusion of the
procedure a coil shaped tissue marker clip was deployed into the
biopsy cavity.

Follow up 2 view mammogram was performed and dictated separately.
IMPRESSION: Ultrasound guided biopsy of the left breast x2. No apparent
complications.

ADDENDUM:
Pathology revealed GRADE II INVASIVE MAMMARY CARCINOMA of the LEFT
breast, 11 o'clock, 6cmfn (ribbon clip). This was found to be
concordant by Dr. Appe Dorhmi.

Pathology revealed GRADE II INVASIVE MAMMARY CARCINOMA of the LEFT
breast, 12 o'clock, 6cmfn (coil clip). This was found to be
concordant by Dr. Appe Dorhmi.

Pathology results were discussed with the patient by telephone. The
patient reported doing well after the biopsies with tenderness at
the sites. Post biopsy instructions and care were reviewed and
questions were answered. The patient was encouraged to call The

Surgical consultation has been arranged with Dr. Mahdhia Aissi at
[[email or web address]] on August 08, 2021.

Recommend bilateral breast MRI for further evaluation of extent
given patient's density.

Pathology results reported by Laautaro Bolatti RN on 07/31/2021.



Lesion quadrant: Upper inner quadrant

Using sterile technique and 1% Lidocaine as local anesthetic, under
direct ultrasound visualization, a 14 gauge Paa Solo device was
used to perform biopsy of a satellite mass at the 11 o'clock
position 3 cm from the nipple using a inferior approach. At the
conclusion of the procedure a ribbon shaped tissue marker clip was
deployed into the biopsy cavity.

Lesion quadrant: Upper inner quadrant

Using sterile technique and 1% Lidocaine as local anesthetic, under
direct ultrasound visualization, a 14 gauge Paa Solo device was
used to perform biopsy of the primary mass at the 12 o'clock
position using a inferior approach. At the conclusion of the
procedure a coil shaped tissue marker clip was deployed into the
biopsy cavity.

Follow up 2 view mammogram was performed and dictated separately.
IMPRESSION: Ultrasound guided biopsy of the left breast x2. No apparent
complications.

## 2022-12-07 ENCOUNTER — Inpatient Hospital Stay: Payer: BC Managed Care – PPO

## 2022-12-17 ENCOUNTER — Encounter (HOSPITAL_COMMUNITY): Payer: Self-pay | Admitting: *Deleted

## 2023-01-07 ENCOUNTER — Inpatient Hospital Stay: Payer: BC Managed Care – PPO

## 2023-01-08 ENCOUNTER — Inpatient Hospital Stay: Payer: BC Managed Care – PPO

## 2023-01-08 ENCOUNTER — Inpatient Hospital Stay: Payer: BC Managed Care – PPO | Attending: Hematology and Oncology

## 2023-01-08 DIAGNOSIS — Z5111 Encounter for antineoplastic chemotherapy: Secondary | ICD-10-CM | POA: Diagnosis not present

## 2023-01-08 DIAGNOSIS — Z170 Estrogen receptor positive status [ER+]: Secondary | ICD-10-CM | POA: Insufficient documentation

## 2023-01-08 DIAGNOSIS — C50812 Malignant neoplasm of overlapping sites of left female breast: Secondary | ICD-10-CM | POA: Diagnosis not present

## 2023-01-08 LAB — CBC WITH DIFFERENTIAL/PLATELET
Abs Immature Granulocytes: 0 10*3/uL (ref 0.00–0.07)
Basophils Absolute: 0 10*3/uL (ref 0.0–0.1)
Basophils Relative: 1 %
Eosinophils Absolute: 0 10*3/uL (ref 0.0–0.5)
Eosinophils Relative: 1 %
HCT: 32.6 % — ABNORMAL LOW (ref 36.0–46.0)
Hemoglobin: 10.8 g/dL — ABNORMAL LOW (ref 12.0–15.0)
Immature Granulocytes: 0 %
Lymphocytes Relative: 42 %
Lymphs Abs: 1.2 10*3/uL (ref 0.7–4.0)
MCH: 31 pg (ref 26.0–34.0)
MCHC: 33.1 g/dL (ref 30.0–36.0)
MCV: 93.7 fL (ref 80.0–100.0)
Monocytes Absolute: 0.3 10*3/uL (ref 0.1–1.0)
Monocytes Relative: 11 %
Neutro Abs: 1.2 10*3/uL — ABNORMAL LOW (ref 1.7–7.7)
Neutrophils Relative %: 45 %
Platelets: 211 10*3/uL (ref 150–400)
RBC: 3.48 MIL/uL — ABNORMAL LOW (ref 3.87–5.11)
RDW: 11.8 % (ref 11.5–15.5)
WBC: 2.8 10*3/uL — ABNORMAL LOW (ref 4.0–10.5)
nRBC: 0 % (ref 0.0–0.2)

## 2023-01-08 LAB — COMPREHENSIVE METABOLIC PANEL
ALT: 15 U/L (ref 0–44)
AST: 19 U/L (ref 15–41)
Albumin: 3.9 g/dL (ref 3.5–5.0)
Alkaline Phosphatase: 59 U/L (ref 38–126)
Anion gap: 6 (ref 5–15)
BUN: 17 mg/dL (ref 6–20)
CO2: 23 mmol/L (ref 22–32)
Calcium: 9.1 mg/dL (ref 8.9–10.3)
Chloride: 109 mmol/L (ref 98–111)
Creatinine, Ser: 0.78 mg/dL (ref 0.44–1.00)
GFR, Estimated: >60 mL/min (ref >60)
Glucose, Bld: 89 mg/dL (ref 70–99)
Potassium: 3.7 mmol/L (ref 3.5–5.1)
Sodium: 138 mmol/L (ref 135–145)
Total Bilirubin: 0.3 mg/dL (ref 0.3–1.2)
Total Protein: 7.4 g/dL (ref 6.5–8.1)

## 2023-01-08 MED ORDER — GOSERELIN ACETATE 3.6 MG ~~LOC~~ IMPL
3.6000 mg | DRUG_IMPLANT | Freq: Once | SUBCUTANEOUS | Status: AC
  Administered 2023-01-08: 3.6 mg via SUBCUTANEOUS
  Filled 2023-01-08: qty 3.6

## 2023-01-14 ENCOUNTER — Inpatient Hospital Stay: Payer: BC Managed Care – PPO | Admitting: Hematology and Oncology

## 2023-01-14 NOTE — Progress Notes (Deleted)
[facility] CONSULT NOTE  Patient Care Team: Doreene Nest, NP as PCP - General (Nurse Practitioner) Rachel Moulds, MD as Consulting Physician (Hematology and Oncology) Dorothy Puffer, MD as Consulting Physician (Radiation Oncology) Harriette Bouillon, MD as Consulting Physician (General Surgery) Tanda Rockers, NP as Nurse Practitioner (Obstetrics and Gynecology)  CHIEF COMPLAINTS/PURPOSE OF CONSULTATION:  Newly diagnosed breast cancer  HISTORY OF PRESENTING ILLNESS:   Nichole [name] 47 y.o. female is here because of recent diagnosis of left breast IDC  SUMMARY OF ONCOLOGIC HISTORY: Oncology History  Malignant neoplasm of overlapping sites of left breast in female, estrogen receptor positive (HCC)  07/08/2021 Mammogram    Screening mammogram 07/08/2021 showed possible mass with distortion in the left breast. 07/21/2021 she had a left diagnostic mammogram which showed a suspicious left breast mass at 12 o'clock position suspicious satellite nodule left breast at 11 o'clock position Ultrasound same day showed a 16 x 20 x 12 mm irregular hypoechoic mass left breast at 12 o'clock position 4 cm from the nipple.  There is an adjacent satellite nodule within the left breast 11 o'clock position 3 cm from nipple measuring 4 x 3 x 6 mm.  No enlarged left axillary lymphadenopathy    07/30/2021 Pathology Results   Pathology from left breast mass 11:00, 3 cm from nipple showed invasive mammary carcinoma grade 2 prognostics ER 50% moderate staining, PR 95% strong staining, Ki-67 less than 5% and HER2 negative.  The second mass at 11:00 also showed similar prognostics and is grade 2.   08/11/2021 Initial Diagnosis   Malignant neoplasm of overlapping sites of left breast in female, estrogen receptor positive (HCC)   08/11/2021 Cancer Staging   Staging form: Breast, AJCC 8th Edition - Pathologic: Stage IB (pT2, pN1, cM0, G2, ER+, PR+, HER2-) - Signed by Rachel Moulds, MD on  10/02/2021 Histologic grading system: 3 grade system   08/29/2021 Genetic Testing   Negative hereditary cancer genetic testing: no pathogenic variants detected in [facility] CancerNext-Expanded +RNAinsight Panel.  Report date is 08/29/2021.   The CancerNext-Expanded gene panel offered by [facility] and includes sequencing, rearrangement, and RNA analysis for the following 77 genes: AIP, ALK, APC, ATM, AXIN2, BAP1, BARD1, BLM, BMPR1A, BRCA1, BRCA2, BRIP1, CDC73, CDH1, CDK4, CDKN1B, CDKN2A, CHEK2, CTNNA1, DICER1, FANCC, FH, FLCN, GALNT12, KIF1B, LZTR1, MAX, MEN1, MET, MLH1, MSH2, MSH3, MSH6, MUTYH, NBN, NF1, NF2, NTHL1, PALB2, PHOX2B, PMS2, POT1, PRKAR1A, PTCH1, PTEN, RAD51C, RAD51D, RB1, RECQL, RET, SDHA, SDHAF2, SDHB, SDHC, SDHD, SMAD4, SMARCA4, SMARCB1, SMARCE1, STK11, SUFU, TMEM127, TP53, TSC1, TSC2, VHL and XRCC2 (sequencing and deletion/duplication); EGFR, EGLN1, HOXB13, KIT, MITF, PDGFRA, POLD1, and POLE (sequencing only); EPCAM and GREM1 (deletion/duplication only).    09/11/2021 Surgery   Left breast lumpectomy showed grade 2 invasive lobular carcinoma, lobular carcinoma in situ showing ductal involvement, invasive tumor focally present in the lateral margin, additional margins very close.  Tumor size greater than 5 cm. Changes consistent with prior biopsies.  Fibrocystic changes including extensive stromal fibrosis and adenosis.  Left axillary lymph node also confirmed metastatic lobular carcinoma.  Final pathologic staging was PT3PN1A   11/03/2021 - 12/19/2021 Radiation Therapy   Site Technique Total Dose (Gy) Dose per Fx (Gy) Completed Fx Beam Energies  Breast, Left: Breast_L 3D 50.4/50.4 1.8 28/28 6X  Breast, Left: Breast_L_SCLV 3D 50.4/50.4 1.8 28/28 6X, 10X  Breast, Left: Breast_L_Bst 3D 10/10 2 5/5 6X     12/2021 -  Anti-estrogen oral therapy   Tamoxifen + Zoladex  Age at first child birth : 74  [name] is here for a follow-up on Tamoxifen and goserelin started in August 2023. She  hasnt been taking verzenio until the past 2 weeks. She says he has been having nausea, lack of mental clarity, fogginess, joint pain, lack of concentration and extreme fatigue.  She says on some days, she can barely walk because her joints hurt so bad.  She is overall not pleased with how things are going and is hoping to feel better.  She is considering bilateral salpingo-oophorectomy to avoid the injections. Rest of the pertinent 10 point ROS reviewed and negative  MEDICAL HISTORY:  Past Medical History:  Diagnosis Date   Asthma    exercise induced asthma   Cancer (HCC) 07/29/2021   left breast   Family history of breast cancer 08/14/2021   Family history of kidney cancer 08/14/2021   Pre-diabetes     SURGICAL HISTORY: Past Surgical History:  Procedure Laterality Date   BIOPSY  03/04/2021   Procedure: BIOPSY;  Surgeon: Quentin Ore, MD;  Location: [facility];  Service: General;;   BREAST BIOPSY Left 07/30/2021   BREAST BIOPSY Right 09/01/2021   BREAST BIOPSY Left 09/05/2021   BREAST LUMPECTOMY WITH RADIOACTIVE SEED AND SENTINEL LYMPH NODE BIOPSY Left 09/11/2021   Procedure: LEFT BREAST LUMPECTOMY WITH RADIOACTIVE SEED X3 AND LEFT SENTINEL LYMPH NODE BIOPSY;  Surgeon: Harriette Bouillon, MD;  Location: [facility];  Service: General;  Laterality: Left;   ESOPHAGOGASTRODUODENOSCOPY N/A 03/04/2021   Procedure: ESOPHAGOGASTRODUODENOSCOPY (EGD);  Surgeon: Quentin Ore, MD;  Location: [facility];  Service: General;  Laterality: N/A;   LAPAROSCOPIC GASTRIC SLEEVE RESECTION N/A 05/02/2021   Procedure: LAPAROSCOPIC GASTRIC SLEEVE RESECTION;  Surgeon: Quentin Ore, MD;  Location: [facility];  Service: General;  Laterality: N/A;   LAPAROSCOPIC VAGINAL HYSTERECTOMY WITH SALPINGECTOMY N/A 10/08/2016   Procedure: LAPAROSCOPIC ASSISTED VAGINAL HYSTERECTOMY WITH RIGHT SALPINGECTOMY LYSIS OF ADHESIONS;  Surgeon: Reva Bores, MD;  Location: [facility];  Service: Gynecology;  Laterality:  N/A;   MYOMECTOMY  2014   RE-EXCISION OF BREAST LUMPECTOMY Left 09/24/2021   Procedure: RE-EXCISION OF LEFT BREAST LUMPECTOMY;  Surgeon: Harriette Bouillon, MD;  Location: [facility];  Service: General;  Laterality: Left;   Right Oophrectomy     UPPER GI ENDOSCOPY N/A 05/02/2021   Procedure: UPPER GI ENDOSCOPY;  Surgeon: Quentin Ore, MD;  Location: [facility];  Service: General;  Laterality: N/A;   WISDOM TOOTH EXTRACTION      SOCIAL HISTORY: Social History   Socioeconomic History   Marital status: Divorced    Spouse name: Not on file   Number of children: 1   Years of education: Not on file   Highest education level: Not on file  Occupational History   Not on file  Tobacco Use   Smoking status: Former    Current packs/day: 0.00    Average packs/day: 0.1 packs/day for 15.0 years (1.5 ttl pk-yrs)    Types: Cigarettes    Start date: 01/23/2002    Quit date: 01/23/2017    Years since quitting: 5.9   Smokeless tobacco: Never  Vaping Use   Vaping status: Never Used  Substance and Sexual Activity   Alcohol use: Not Currently    Comment: occ   Drug use: Not Currently   Sexual activity: Not Currently    Birth control/protection: Surgical  Other Topics Concern   Not on file  Social History Narrative   Work as a Engineer, 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   Has 79 year old son.   Moved from [place] in Sept. 2015   Social Determinants of Health   Financial Resource Strain: Not on file  Food Insecurity: Not on file  Transportation Needs: Not on file  Physical Activity: Not on file  Stress: Not on file  Social Connections: Not on file  Intimate Partner Violence: Not on file    FAMILY HISTORY: Family History  Problem Relation Age of Onset   Arthritis Mother    Breast cancer Mother 36       Lumpectomy   Hyperlipidemia Mother    Hypertension Mother    Kidney cancer Mother 81       Had ablation   Graves' disease Mother        Had  thyroid removed   Graves' disease Brother        Deceased    ALLERGIES:  has No Known Allergies.  MEDICATIONS:  Current Outpatient Medications  Medication Sig Dispense Refill   abemaciclib (VERZENIO) 100 MG tablet Take 1 tablet (100 mg total) by mouth 2 (two) times daily. 56 tablet 1   albuterol (VENTOLIN HFA) 108 (90 Base) MCG/ACT inhaler Inhale 1-2 puffs into the lungs every 6 (six) hours as needed for wheezing or shortness of breath. 1 each 0   CALCIUM PO Take 1 tablet by mouth 3 (three) times daily.     cetirizine (ZYRTEC) 10 MG tablet Take 1 tablet (10 mg total) by mouth at bedtime. For allergies (Patient taking differently: Take 10 mg by mouth as needed. For allergies) 90 tablet 0   fluticasone (FLONASE) 50 MCG/ACT nasal spray Place 1 spray into both nostrils 2 (two) times daily as needed for allergies or rhinitis. 48 mL 0   Multiple Vitamin (MULTI-VITAMIN) tablet Take 1 tablet by mouth daily.     tamoxifen (NOLVADEX) 20 MG tablet Take 1 tablet (20 mg total) by mouth daily. 90 tablet 3   venlafaxine XR (EFFEXOR-XR) 75 MG 24 hr capsule Take 1 capsule (75 mg total) by mouth daily with breakfast. 90 capsule 3   No current facility-administered medications for this visit.    PHYSICAL EXAMINATION: ECOG PERFORMANCE STATUS: 0 - Asymptomatic   LMP 09/09/2016 (Approximate)   Physical Exam Constitutional:      Appearance: Normal appearance.     Comments: No acute distress  Chest:     Comments: Breast exam, post radiation changes, no palpable masses otherwise. Musculoskeletal:     Cervical back: Normal range of motion and neck supple. No rigidity.  Lymphadenopathy:     Cervical: No cervical adenopathy.  Neurological:     Mental Status: She is alert.    LABORATORY DATA:  I have reviewed the data as listed Lab Results  Component Value Date   WBC 2.8 (L) 01/08/2023   HGB 10.8 (L) 01/08/2023   HCT 32.6 (L) 01/08/2023   MCV 93.7 01/08/2023   PLT 211 01/08/2023   Lab Results   Component Value Date   NA 138 01/08/2023   K 3.7 01/08/2023   CL 109 01/08/2023   CO2 23 01/08/2023    RADIOGRAPHIC STUDIES: I have personally reviewed the radiological reports and agreed with the findings in the report.  ASSESSMENT AND PLAN:  No problem-specific Assessment & Plan notes found for this encounter.   Total time spent:30 minutes  Thank you for consulting [place] in the care of this patient.  Please not hesitate  to contact us with any additional questions or concerns. All questions were answered. The patient knows to call the clinic with any problems, questions or concerns.    Rachel Moulds, MD 01/14/23

## 2023-01-22 ENCOUNTER — Encounter: Payer: Self-pay | Admitting: Obstetrics and Gynecology

## 2023-01-22 ENCOUNTER — Ambulatory Visit (INDEPENDENT_AMBULATORY_CARE_PROVIDER_SITE_OTHER): Payer: BC Managed Care – PPO | Admitting: Obstetrics and Gynecology

## 2023-01-22 VITALS — BP 120/82 | HR 94 | Ht 65.75 in | Wt 224.0 lb

## 2023-01-22 DIAGNOSIS — Z1211 Encounter for screening for malignant neoplasm of colon: Secondary | ICD-10-CM

## 2023-01-22 DIAGNOSIS — C50011 Malignant neoplasm of nipple and areola, right female breast: Secondary | ICD-10-CM | POA: Diagnosis not present

## 2023-01-22 NOTE — Progress Notes (Signed)
Nichole [name] 1975-09-06 [id number]   History:  47 y.o. G2P1 presents for a. Hx of left breast cancer with lumpectomy 2023. Followed by Dr Al Pimple. Hx of negative [facility] genetics screening 2023. She is currently receiving Zoladex every 4 weeks and is interested in stopping this medication once her  ovaries are removed.  She understands the risks and complications with her ovaries being removed and risk of early fracture and other menopausal symptoms. 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  Her mother was also diagnosed with early onset of breast cancer as well.  Gynecologic History Hysterectomy/TAH fibroids 2018 ovaries intact  09/11/2021 Surgery    Left breast lumpectomy showed grade 2 invasive lobular carcinoma, lobular carcinoma in situ showing ductal involvement, invasive tumor focally present in the lateral margin, additional margins very close.  Tumor size greater than 5 cm. Changes consistent with prior biopsies.  Fibrocystic changes including extensive stromal fibrosis and adenosis.  Left axillary lymph node also confirmed metastatic lobular carcinoma.  Final pathologic staging was PT3PN1A     strong ER/PR positivity, low proliferation index and HER2 negative tumor   PGYNHX: fibroids, anemia, remote abnormal pap with no leep or cryo procedures Past Medical History:  Diagnosis Date   Asthma    exercise induced asthma   Cancer (HCC) 07/29/2021   left breast   Family history of breast cancer 08/14/2021   Family history of kidney cancer 08/14/2021   Pre-diabetes    Social History   Socioeconomic History   Marital status: Divorced    Spouse name: Not on file   Number of children: 1   Years of education: Not on file   Highest education level: Not on file  Occupational History   Not on file  Tobacco Use   Smoking status: Former    Current packs/day: 0.00    Average packs/day: 0.1 packs/day for 15.0 years (1.5 ttl pk-yrs)    Types:  Cigarettes    Start date: 01/23/2002    Quit date: 01/23/2017    Years since quitting: 6.0   Smokeless tobacco: Never  Vaping Use   Vaping status: Never Used  Substance and Sexual Activity   Alcohol use: Not Currently   Drug use: Not Currently   Sexual activity: Not Currently    Birth control/protection: Surgical    Comment: hysterectomy  Other Topics Concern   Not on file  Social History Narrative   Work as a Engineer, 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   Has 36 year old son.   Moved from [place] in Sept. 2015   Social Determinants of Health   Financial Resource Strain: Not on file  Food Insecurity: Not on file  Transportation Needs: Not on file  Physical Activity: Not on file  Stress: Not on file  Social Connections: Not on file  Intimate Partner Violence: Not on file   Past Surgical History:  Procedure Laterality Date   BIOPSY  03/04/2021   Procedure: BIOPSY;  Surgeon: Quentin Ore, MD;  Location: [facility];  Service: General;;   BREAST BIOPSY Left 07/30/2021   BREAST BIOPSY Right 09/01/2021   BREAST BIOPSY Left 09/05/2021   BREAST LUMPECTOMY WITH RADIOACTIVE SEED AND SENTINEL LYMPH NODE BIOPSY Left 09/11/2021   Procedure: LEFT BREAST LUMPECTOMY WITH RADIOACTIVE SEED X3 AND LEFT SENTINEL LYMPH NODE BIOPSY;  Surgeon: Harriette Bouillon, MD;  Location: [facility];  Service: General;  Laterality: Left;  ESOPHAGOGASTRODUODENOSCOPY N/A 03/04/2021   Procedure: ESOPHAGOGASTRODUODENOSCOPY (EGD);  Surgeon: Quentin Ore, MD;  Location: [facility];  Service: General;  Laterality: N/A;   LAPAROSCOPIC GASTRIC SLEEVE RESECTION N/A 05/02/2021   Procedure: LAPAROSCOPIC GASTRIC SLEEVE RESECTION;  Surgeon: Quentin Ore, MD;  [facility]: [facility];  Service: General;  Laterality: N/A;   LAPAROSCOPIC VAGINAL HYSTERECTOMY WITH SALPINGECTOMY N/A 10/08/2016   Procedure: LAPAROSCOPIC ASSISTED VAGINAL HYSTERECTOMY WITH RIGHT SALPINGECTOMY LYSIS OF ADHESIONS;   Surgeon: Reva Bores, MD;  Location: [facility];  Service: Gynecology;  Laterality: N/A;   MYOMECTOMY  2014   RE-EXCISION OF BREAST LUMPECTOMY Left 09/24/2021   Procedure: RE-EXCISION OF LEFT BREAST LUMPECTOMY;  Surgeon: Harriette Bouillon, MD;  Location: [facility];  Service: General;  Laterality: Left;   Right Oophrectomy     UPPER GI ENDOSCOPY N/A 05/02/2021   Procedure: UPPER GI ENDOSCOPY;  Surgeon: Quentin Ore, MD;  Location: [facility];  Service: General;  Laterality: N/A;   WISDOM TOOTH EXTRACTION      ROS:  A ROS was performed and pertinent positives and negatives are included.  Blood pressure 120/82, pulse 94, height 5' 5.75" (1.67 m), weight 224 lb (101.6 kg), last menstrual period 09/09/2016, SpO2 99%.   Body mass index is 36.43 kg/m.     Assessment/Plan:   1. Malignant neoplasm of overlapping sites of left breast in female, estrogen receptor positive (HCC)  2. Family history of breast cancer Counseled again on the risk for surgical menopause and expressed concern of the risk to her bone health.  Patient voices understanding.  She will begin calcium and vitamin D and encouraged to also perform low weightbearing exercises for her bone strength.  The robotic BSO was explained in detail.  Patient would like to do the preop appointment at the day of surgery, which is fine.  Discussed the benefits of the robotic procedure.  With better visualization less bleeding and faster return to her normal activities.  Understands she will need a ride there and home.  She cannot stay home the night of surgery and cannot drive for 24 hours after.  Surgical case request was placed.   30 minutes spent on reviewing records, imaging,  and one on one patient time and counseling patient and documentation Dr. Judith Blonder [facility] 10:37 AM 01/22/2023

## 2023-02-05 ENCOUNTER — Inpatient Hospital Stay: Payer: BC Managed Care – PPO | Attending: Hematology and Oncology

## 2023-02-05 ENCOUNTER — Inpatient Hospital Stay: Payer: BC Managed Care – PPO

## 2023-02-05 DIAGNOSIS — Z170 Estrogen receptor positive status [ER+]: Secondary | ICD-10-CM | POA: Insufficient documentation

## 2023-02-05 DIAGNOSIS — Z5111 Encounter for antineoplastic chemotherapy: Secondary | ICD-10-CM | POA: Diagnosis not present

## 2023-02-05 DIAGNOSIS — C50812 Malignant neoplasm of overlapping sites of left female breast: Secondary | ICD-10-CM | POA: Insufficient documentation

## 2023-02-05 LAB — COMPREHENSIVE METABOLIC PANEL
ALT: 17 U/L (ref 0–44)
AST: 20 U/L (ref 15–41)
Albumin: 3.7 g/dL (ref 3.5–5.0)
Alkaline Phosphatase: 61 U/L (ref 38–126)
Anion gap: 6 (ref 5–15)
BUN: 17 mg/dL (ref 6–20)
CO2: 24 mmol/L (ref 22–32)
Calcium: 8.6 mg/dL — ABNORMAL LOW (ref 8.9–10.3)
Chloride: 108 mmol/L (ref 98–111)
Creatinine, Ser: 0.92 mg/dL (ref 0.44–1.00)
GFR, Estimated: >60 mL/min (ref >60)
Glucose, Bld: 89 mg/dL (ref 70–99)
Potassium: 3.6 mmol/L (ref 3.5–5.1)
Sodium: 138 mmol/L (ref 135–145)
Total Bilirubin: 0.2 mg/dL — ABNORMAL LOW (ref 0.3–1.2)
Total Protein: 7.2 g/dL (ref 6.5–8.1)

## 2023-02-05 LAB — CBC WITH DIFFERENTIAL/PLATELET
Abs Immature Granulocytes: 0 10*3/uL (ref 0.00–0.07)
Basophils Absolute: 0 10*3/uL (ref 0.0–0.1)
Basophils Relative: 0 %
Eosinophils Absolute: 0.1 10*3/uL (ref 0.0–0.5)
Eosinophils Relative: 2 %
HCT: 34.5 % — ABNORMAL LOW (ref 36.0–46.0)
Hemoglobin: 11.6 g/dL — ABNORMAL LOW (ref 12.0–15.0)
Immature Granulocytes: 0 %
Lymphocytes Relative: 40 %
Lymphs Abs: 1.2 10*3/uL (ref 0.7–4.0)
MCH: 31.2 pg (ref 26.0–34.0)
MCHC: 33.6 g/dL (ref 30.0–36.0)
MCV: 92.7 fL (ref 80.0–100.0)
Monocytes Absolute: 0.3 10*3/uL (ref 0.1–1.0)
Monocytes Relative: 10 %
Neutro Abs: 1.5 10*3/uL — ABNORMAL LOW (ref 1.7–7.7)
Neutrophils Relative %: 48 %
Platelets: 242 10*3/uL (ref 150–400)
RBC: 3.72 MIL/uL — ABNORMAL LOW (ref 3.87–5.11)
RDW: 11.9 % (ref 11.5–15.5)
WBC: 3.1 10*3/uL — ABNORMAL LOW (ref 4.0–10.5)
nRBC: 0 % (ref 0.0–0.2)

## 2023-02-05 MED ORDER — GOSERELIN ACETATE 3.6 MG ~~LOC~~ IMPL
3.6000 mg | DRUG_IMPLANT | Freq: Once | SUBCUTANEOUS | Status: AC
  Administered 2023-02-05: 3.6 mg via SUBCUTANEOUS
  Filled 2023-02-05: qty 3.6

## 2023-02-08 ENCOUNTER — Inpatient Hospital Stay: Payer: BC Managed Care – PPO

## 2023-02-22 ENCOUNTER — Ambulatory Visit
Admission: RE | Admit: 2023-02-22 | Discharge: 2023-02-22 | Disposition: A | Discharge status: Home / Self Care | Payer: BC Managed Care – PPO | Source: Ambulatory Visit | Attending: Obstetrics and Gynecology | Admitting: Obstetrics and Gynecology

## 2023-02-22 DIAGNOSIS — C50011 Malignant neoplasm of nipple and areola, right female breast: Secondary | ICD-10-CM | POA: Diagnosis not present

## 2023-02-22 DIAGNOSIS — Z853 Personal history of malignant neoplasm of breast: Secondary | ICD-10-CM | POA: Diagnosis not present

## 2023-03-09 ENCOUNTER — Inpatient Hospital Stay: Payer: BC Managed Care – PPO

## 2023-03-12 ENCOUNTER — Inpatient Hospital Stay: Payer: BC Managed Care – PPO

## 2023-03-12 ENCOUNTER — Inpatient Hospital Stay: Payer: BC Managed Care – PPO | Attending: Hematology and Oncology

## 2023-03-12 DIAGNOSIS — Z170 Estrogen receptor positive status [ER+]: Secondary | ICD-10-CM | POA: Insufficient documentation

## 2023-03-12 DIAGNOSIS — C50812 Malignant neoplasm of overlapping sites of left female breast: Secondary | ICD-10-CM

## 2023-03-12 DIAGNOSIS — Z5111 Encounter for antineoplastic chemotherapy: Secondary | ICD-10-CM | POA: Insufficient documentation

## 2023-03-12 DIAGNOSIS — Z79810 Long term (current) use of selective estrogen receptor modulators (SERMs): Secondary | ICD-10-CM | POA: Diagnosis not present

## 2023-03-12 LAB — CBC WITH DIFFERENTIAL/PLATELET
Abs Immature Granulocytes: 0 K/uL (ref 0.00–0.07)
Basophils Absolute: 0 K/uL (ref 0.0–0.1)
Basophils Relative: 0 %
Eosinophils Absolute: 0 K/uL (ref 0.0–0.5)
Eosinophils Relative: 1 %
HCT: 33.1 % — ABNORMAL LOW (ref 36.0–46.0)
Hemoglobin: 11.1 g/dL — ABNORMAL LOW (ref 12.0–15.0)
Immature Granulocytes: 0 %
Lymphocytes Relative: 44 %
Lymphs Abs: 1.4 K/uL (ref 0.7–4.0)
MCH: 30.8 pg (ref 26.0–34.0)
MCHC: 33.5 g/dL (ref 30.0–36.0)
MCV: 91.9 fL (ref 80.0–100.0)
Monocytes Absolute: 0.3 K/uL (ref 0.1–1.0)
Monocytes Relative: 9 %
Neutro Abs: 1.4 K/uL — ABNORMAL LOW (ref 1.7–7.7)
Neutrophils Relative %: 46 %
Platelets: 209 K/uL (ref 150–400)
RBC: 3.6 MIL/uL — ABNORMAL LOW (ref 3.87–5.11)
RDW: 11.9 % (ref 11.5–15.5)
WBC: 3.1 K/uL — ABNORMAL LOW (ref 4.0–10.5)
nRBC: 0 % (ref 0.0–0.2)

## 2023-03-12 LAB — COMPREHENSIVE METABOLIC PANEL
ALT: 13 U/L (ref 0–44)
AST: 18 U/L (ref 15–41)
Albumin: 3.8 g/dL (ref 3.5–5.0)
Alkaline Phosphatase: 64 U/L (ref 38–126)
Anion gap: 7 (ref 5–15)
BUN: 18 mg/dL (ref 6–20)
CO2: 24 mmol/L (ref 22–32)
Calcium: 8.7 mg/dL — ABNORMAL LOW (ref 8.9–10.3)
Chloride: 110 mmol/L (ref 98–111)
Creatinine, Ser: 1.2 mg/dL — ABNORMAL HIGH (ref 0.44–1.00)
GFR, Estimated: 57 mL/min — ABNORMAL LOW (ref >60)
Glucose, Bld: 100 mg/dL — ABNORMAL HIGH (ref 70–99)
Potassium: 3.6 mmol/L (ref 3.5–5.1)
Sodium: 141 mmol/L (ref 135–145)
Total Bilirubin: 0.2 mg/dL — ABNORMAL LOW (ref 0.3–1.2)
Total Protein: 7.3 g/dL (ref 6.5–8.1)

## 2023-03-12 MED ORDER — GOSERELIN ACETATE 3.6 MG ~~LOC~~ IMPL
3.6000 mg | DRUG_IMPLANT | Freq: Once | SUBCUTANEOUS | Status: AC
  Administered 2023-03-12: 3.6 mg via SUBCUTANEOUS
  Filled 2023-03-12: qty 3.6

## 2023-03-22 ENCOUNTER — Encounter: Payer: Self-pay | Admitting: Hematology and Oncology

## 2023-03-23 ENCOUNTER — Ambulatory Visit (INDEPENDENT_AMBULATORY_CARE_PROVIDER_SITE_OTHER): Payer: BC Managed Care – PPO | Admitting: Obstetrics and Gynecology

## 2023-03-23 ENCOUNTER — Encounter: Payer: Self-pay | Admitting: *Deleted

## 2023-03-23 ENCOUNTER — Encounter: Payer: Self-pay | Admitting: Obstetrics and Gynecology

## 2023-03-23 VITALS — BP 110/80 | HR 97 | Ht 65.25 in | Wt 221.0 lb

## 2023-03-23 DIAGNOSIS — C50011 Malignant neoplasm of nipple and areola, right female breast: Secondary | ICD-10-CM

## 2023-03-23 DIAGNOSIS — Z01818 Encounter for other preprocedural examination: Secondary | ICD-10-CM

## 2023-03-23 MED ORDER — IBUPROFEN 800 MG PO TABS: 800.0000 mg | ORAL_TABLET | Freq: Three times a day (TID) | ORAL | 1 refills | Status: DC | PRN

## 2023-03-23 MED ORDER — OXYCODONE HCL 5 MG PO TABS: 5.0000 mg | ORAL_TABLET | ORAL | 0 refills | Status: DC | PRN

## 2023-03-23 MED ORDER — METOCLOPRAMIDE HCL 10 MG PO TABS: 10.0000 mg | ORAL_TABLET | Freq: Three times a day (TID) | ORAL | 0 refills | Status: DC | PRN

## 2023-03-23 NOTE — Progress Notes (Signed)
 Nichole [name] [birth year] [id number]  PREOP H&P for robotic BSO  History:  47 y.o. G2P1 presents for a. Hx of left breast cancer with lumpectomy 2023. Followed by Dr Loretha. Hx of negative [facility] genetics screening 2023. She is currently receiving Zoladex  every 4 weeks and is interested in stopping this medication once her  ovaries are removed.  She understands the risks and complications with her ovaries being removed and risk of early fracture and other menopausal symptoms. 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  Her mother was also diagnosed with early onset of breast cancer as well.  Gynecologic History Hysterectomy/TAH fibroids 2018 ovaries intact  09/11/2021 Surgery    Left breast lumpectomy showed grade 2 invasive lobular carcinoma, lobular carcinoma in situ showing ductal involvement, invasive tumor focally present in the lateral margin, additional margins very close.  Tumor size greater than 5 cm. Changes consistent with prior biopsies.  Fibrocystic changes including extensive stromal fibrosis and adenosis.  Left axillary lymph node also confirmed metastatic lobular carcinoma.  Final pathologic staging was PT3PN1A     strong ER/PR positivity, low proliferation index and HER2 negative tumor   PGYNHX: fibroids, anemia, remote abnormal pap with no leep or cryo procedures Past Medical History:  Diagnosis Date   Asthma    exercise induced asthma   Cancer (HCC) 07/29/2021   left breast   Family history of breast cancer 08/14/2021   Family history of kidney cancer 08/14/2021   Pre-diabetes    Social History   Socioeconomic History   Marital status: Divorced    Spouse name: Not on file   Number of children: 1   Years of education: Not on file   Highest education level: Not on file  Occupational History   Not on file  Tobacco Use   Smoking status: Former    Current packs/day: 0.00    Average packs/day: 0.1 packs/day for 15.0 years (1.5  ttl pk-yrs)    Types: Cigarettes    Start date: 01/23/2002    Quit date: 01/23/2017    Years since quitting: 6.1   Smokeless tobacco: Never  Vaping Use   Vaping status: Never Used  Substance and Sexual Activity   Alcohol use: Not Currently   Drug use: Not Currently   Sexual activity: Not Currently    Birth control/protection: Surgical    Comment: hysterectomy  Other Topics Concern   Not on file  Social History Narrative   Work as a Engineer, 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   Has 59 year old son.   Moved from [place]  in Sept. 2015   Social Determinants of Health   Financial Resource Strain: Not on file  Food Insecurity: Not on file  Transportation Needs: Not on file  Physical Activity: Not on file  Stress: Not on file  Social Connections: Not on file  Intimate Partner Violence: Not on file   Past Surgical History:  Procedure Laterality Date   BIOPSY  03/04/2021   Procedure: BIOPSY;  Surgeon: Lyndel Deward PARAS, MD;  [facility]: [facility];  Service: General;;   BREAST BIOPSY Left 07/30/2021   BREAST BIOPSY Right 09/01/2021   BREAST BIOPSY Left 09/05/2021   BREAST LUMPECTOMY WITH RADIOACTIVE SEED AND SENTINEL LYMPH NODE BIOPSY Left 09/11/2021   Procedure: LEFT BREAST LUMPECTOMY WITH RADIOACTIVE SEED X3 AND LEFT SENTINEL LYMPH NODE BIOPSY;  Surgeon: Vanderbilt Ned, MD;  [facility]: [facility];  Service:  General;  Laterality: Left;   ESOPHAGOGASTRODUODENOSCOPY N/A 03/04/2021   Procedure: ESOPHAGOGASTRODUODENOSCOPY (EGD);  Surgeon: Lyndel Deward PARAS, MD;  Location: [facility];  Service: General;  Laterality: N/A;   LAPAROSCOPIC GASTRIC SLEEVE RESECTION N/A 05/02/2021   Procedure: LAPAROSCOPIC GASTRIC SLEEVE RESECTION;  Surgeon: Lyndel Deward PARAS, MD;  [facility]: [facility];  Service: General;  Laterality: N/A;   LAPAROSCOPIC VAGINAL HYSTERECTOMY WITH SALPINGECTOMY N/A 10/08/2016   Procedure: LAPAROSCOPIC ASSISTED VAGINAL HYSTERECTOMY WITH RIGHT  SALPINGECTOMY LYSIS OF ADHESIONS;  Surgeon: Fredirick Glenys RAMAN, MD;  Location: [facility];  Service: Gynecology;  Laterality: N/A;   MYOMECTOMY  2014   RE-EXCISION OF BREAST LUMPECTOMY Left 09/24/2021   Procedure: RE-EXCISION OF LEFT BREAST LUMPECTOMY;  Surgeon: Vanderbilt Ned, MD;  Location: [facility];  Service: General;  Laterality: Left;   Right Oophrectomy     UPPER GI ENDOSCOPY N/A 05/02/2021   Procedure: UPPER GI ENDOSCOPY;  Surgeon: Lyndel Deward PARAS, MD;  Location: [facility];  Service: General;  Laterality: N/A;   WISDOM TOOTH EXTRACTION      ROS:  A ROS was performed and pertinent positives and negatives are included.  Blood pressure 110/80, pulse 97, height 5' 5.25" (1.657 m), weight 221 lb (100.2 kg), last menstrual period 09/09/2016, SpO2 97%.   Body mass index is 36.5 kg/m.     Assessment/Plan:   1. Malignant neoplasm of overlapping sites of left breast in female, estrogen receptor positive (HCC)  2. Family history of breast cancer Counseled again on the risk for surgical menopause and expressed concern of the risk to her bone health.  Patient voices understanding.  She will begin calcium and vitamin D  and encouraged to also perform low weightbearing exercises for her bone strength.  The robotic BSO was explained in detail.  Patient would like to do the preop appointment at the day of surgery, which is fine.  Discussed the benefits of the robotic procedure.  With better visualization less bleeding and faster return to her normal activities.  Understands she will need a ride there and home.  She cannot stay home the night of surgery and cannot drive for 24 hours after.  Surgical case request was placed.   Reviewed again r/b/a/I of the procedure and what to expect.  She is ready to schedule surgery. Post op medications sent. Risks of surgery include but are not limited to: bleeding, infection, injury to surrounding organs/tissues (i.e. bowel/bladder/ureters), need for  additional procedures, wound complications, hospital re-admission,  conversion to open surgery, and VTE.  Reviewed restrictions and recovery following surgery    Almarie MARLA Carpen WHNP-BC 4:16 PM 03/23/2023

## 2023-03-23 NOTE — H&P (View-Only) (Signed)
Nichole [name] [birth year] [id number]  PREOP H&P for robotic BSO  History:  47 y.o. G2P1 presents for a. Hx of left breast cancer with lumpectomy 2023. Followed by Dr Al Pimple. Hx of negative Ambry genetics screening 2023. She is currently receiving Zoladex every 4 weeks and is interested in stopping this medication once her  ovaries are removed.  She understands the risks and complications with her ovaries being removed and risk of early fracture and other menopausal symptoms. 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  Her mother was also diagnosed with early onset of breast cancer as well.  Gynecologic History Hysterectomy/TAH fibroids 2018 ovaries intact  09/11/2021 Surgery    Left breast lumpectomy showed grade 2 invasive lobular carcinoma, lobular carcinoma in situ showing ductal involvement, invasive tumor focally present in the lateral margin, additional margins very close.  Tumor size greater than 5 cm. Changes consistent with prior biopsies.  Fibrocystic changes including extensive stromal fibrosis and adenosis.  Left axillary lymph node also confirmed metastatic lobular carcinoma.  Final pathologic staging was PT3PN1A     strong ER/PR positivity, low proliferation index and HER2 negative tumor   PGYNHX: fibroids, anemia, remote abnormal pap with no leep or cryo procedures Past Medical History:  Diagnosis Date   Asthma    exercise induced asthma   Cancer (HCC) 07/29/2021   left breast   Family history of breast cancer 08/14/2021   Family history of kidney cancer 08/14/2021   Pre-diabetes    Social History   Socioeconomic History   Marital status: Divorced    Spouse name: Not on file   Number of children: 1   Years of education: Not on file   Highest education level: Not on file  Occupational History   Not on file  Tobacco Use   Smoking status: Former    Current packs/day: 0.00    Average packs/day: 0.1 packs/day for 15.0 years (1.5  ttl pk-yrs)    Types: Cigarettes    Start date: 01/23/2002    Quit date: 01/23/2017    Years since quitting: 6.1   Smokeless tobacco: Never  Vaping Use   Vaping status: Never Used  Substance and Sexual Activity   Alcohol use: Not Currently   Drug use: Not Currently   Sexual activity: Not Currently    Birth control/protection: Surgical    Comment: hysterectomy  Other Topics Concern   Not on file  Social History Narrative   Work as a Engineer, 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   Has 73 year old son.   Moved from [place] in Sept. 2015   Social Determinants of Health   Financial Resource Strain: Not on file  Food Insecurity: Not on file  Transportation Needs: Not on file  Physical Activity: Not on file  Stress: Not on file  Social Connections: Not on file  Intimate Partner Violence: Not on file   Past Surgical History:  Procedure Laterality Date   BIOPSY  03/04/2021   Procedure: BIOPSY;  Surgeon: Quentin Ore, MD;  Location: [facility];  Service: General;;   BREAST BIOPSY Left 07/30/2021   BREAST BIOPSY Right 09/01/2021   BREAST BIOPSY Left 09/05/2021   BREAST LUMPECTOMY WITH RADIOACTIVE SEED AND SENTINEL LYMPH NODE BIOPSY Left 09/11/2021   Procedure: LEFT BREAST LUMPECTOMY WITH RADIOACTIVE SEED X3 AND LEFT SENTINEL LYMPH NODE BIOPSY;  Surgeon: Harriette Bouillon, MD;  Location: [facility];  Service:  Nichole [name] [birth year] [id number]  PREOP H&P for robotic BSO  History:  47 y.o. G2P1 presents for a. Hx of left breast cancer with lumpectomy 2023. Followed by Dr Al Pimple. Hx of negative Ambry genetics screening 2023. She is currently receiving Zoladex every 4 weeks and is interested in stopping this medication once her  ovaries are removed.  She understands the risks and complications with her ovaries being removed and risk of early fracture and other menopausal symptoms. 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  Her mother was also diagnosed with early onset of breast cancer as well.  Gynecologic History Hysterectomy/TAH fibroids 2018 ovaries intact  09/11/2021 Surgery    Left breast lumpectomy showed grade 2 invasive lobular carcinoma, lobular carcinoma in situ showing ductal involvement, invasive tumor focally present in the lateral margin, additional margins very close.  Tumor size greater than 5 cm. Changes consistent with prior biopsies.  Fibrocystic changes including extensive stromal fibrosis and adenosis.  Left axillary lymph node also confirmed metastatic lobular carcinoma.  Final pathologic staging was PT3PN1A     strong ER/PR positivity, low proliferation index and HER2 negative tumor   PGYNHX: fibroids, anemia, remote abnormal pap with no leep or cryo procedures Past Medical History:  Diagnosis Date   Asthma    exercise induced asthma   Cancer (HCC) 07/29/2021   left breast   Family history of breast cancer 08/14/2021   Family history of kidney cancer 08/14/2021   Pre-diabetes    Social History   Socioeconomic History   Marital status: Divorced    Spouse name: Not on file   Number of children: 1   Years of education: Not on file   Highest education level: Not on file  Occupational History   Not on file  Tobacco Use   Smoking status: Former    Current packs/day: 0.00    Average packs/day: 0.1 packs/day for 15.0 years (1.5  ttl pk-yrs)    Types: Cigarettes    Start date: 01/23/2002    Quit date: 01/23/2017    Years since quitting: 6.1   Smokeless tobacco: Never  Vaping Use   Vaping status: Never Used  Substance and Sexual Activity   Alcohol use: Not Currently   Drug use: Not Currently   Sexual activity: Not Currently    Birth control/protection: Surgical    Comment: hysterectomy  Other Topics Concern   Not on file  Social History Narrative   Work as a Engineer, 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   Has 73 year old son.   Moved from [place] in Sept. 2015   Social Determinants of Health   Financial Resource Strain: Not on file  Food Insecurity: Not on file  Transportation Needs: Not on file  Physical Activity: Not on file  Stress: Not on file  Social Connections: Not on file  Intimate Partner Violence: Not on file   Past Surgical History:  Procedure Laterality Date   BIOPSY  03/04/2021   Procedure: BIOPSY;  Surgeon: Quentin Ore, MD;  Location: [facility];  Service: General;;   BREAST BIOPSY Left 07/30/2021   BREAST BIOPSY Right 09/01/2021   BREAST BIOPSY Left 09/05/2021   BREAST LUMPECTOMY WITH RADIOACTIVE SEED AND SENTINEL LYMPH NODE BIOPSY Left 09/11/2021   Procedure: LEFT BREAST LUMPECTOMY WITH RADIOACTIVE SEED X3 AND LEFT SENTINEL LYMPH NODE BIOPSY;  Surgeon: Harriette Bouillon, MD;  Location: [facility];  Service:

## 2023-03-25 ENCOUNTER — Encounter (HOSPITAL_BASED_OUTPATIENT_CLINIC_OR_DEPARTMENT_OTHER): Payer: Self-pay | Admitting: Obstetrics and Gynecology

## 2023-03-25 NOTE — Progress Notes (Signed)
Spoke w/ via phone for pre-op interview--- pt Lab needs dos----    no     Lab results------ no COVID test -----patient states asymptomatic no test needed Arrive at ------- 0900 on 03-30-2023 NPO after MN NO Solid Food.  Clear liquids from MN until--- 0800 Med rec completed Medications to take morning of surgery ----- none Diabetic medication ----- n/a Patient instructed no nail polish to be worn day of surgery Patient instructed to bring photo id and insurance card day of surgery Patient aware to have Driver (ride ) / caregiver    for 24 hours after surgery - sig other, kelvin Patient Special Instructions ----- n/a Pre-Op special Instructions ----- n/a Patient verbalized understanding of instructions that were given at this phone interview. Patient denies chest pain, sob, fever, cough at the interview.

## 2023-03-30 ENCOUNTER — Ambulatory Visit (HOSPITAL_BASED_OUTPATIENT_CLINIC_OR_DEPARTMENT_OTHER): Payer: BC Managed Care – PPO | Admitting: Certified Registered Nurse Anesthetist

## 2023-03-30 ENCOUNTER — Encounter (HOSPITAL_BASED_OUTPATIENT_CLINIC_OR_DEPARTMENT_OTHER): Payer: Self-pay | Admitting: Obstetrics and Gynecology

## 2023-03-30 ENCOUNTER — Ambulatory Visit (HOSPITAL_BASED_OUTPATIENT_CLINIC_OR_DEPARTMENT_OTHER)
Admission: RE | Admit: 2023-03-30 | Discharge: 2023-03-30 | Disposition: A | Discharge status: Home / Self Care | Payer: BC Managed Care – PPO | Attending: Obstetrics and Gynecology | Admitting: Obstetrics and Gynecology

## 2023-03-30 ENCOUNTER — Other Ambulatory Visit: Payer: Self-pay

## 2023-03-30 ENCOUNTER — Encounter (HOSPITAL_BASED_OUTPATIENT_CLINIC_OR_DEPARTMENT_OTHER)
Admission: RE | Disposition: A | Discharge status: Home / Self Care | Payer: Self-pay | Source: Home / Self Care | Attending: Obstetrics and Gynecology

## 2023-03-30 DIAGNOSIS — C50812 Malignant neoplasm of overlapping sites of left female breast: Secondary | ICD-10-CM | POA: Insufficient documentation

## 2023-03-30 DIAGNOSIS — Z87891 Personal history of nicotine dependence: Secondary | ICD-10-CM | POA: Diagnosis not present

## 2023-03-30 DIAGNOSIS — Z170 Estrogen receptor positive status [ER+]: Secondary | ICD-10-CM | POA: Diagnosis not present

## 2023-03-30 DIAGNOSIS — R102 Pelvic and perineal pain: Secondary | ICD-10-CM | POA: Insufficient documentation

## 2023-03-30 DIAGNOSIS — Z4002 Encounter for prophylactic removal of ovary: Secondary | ICD-10-CM

## 2023-03-30 DIAGNOSIS — Z90710 Acquired absence of both cervix and uterus: Secondary | ICD-10-CM | POA: Diagnosis not present

## 2023-03-30 DIAGNOSIS — Z01818 Encounter for other preprocedural examination: Secondary | ICD-10-CM

## 2023-03-30 DIAGNOSIS — C50011 Malignant neoplasm of nipple and areola, right female breast: Secondary | ICD-10-CM | POA: Diagnosis not present

## 2023-03-30 DIAGNOSIS — Z803 Family history of malignant neoplasm of breast: Secondary | ICD-10-CM | POA: Diagnosis not present

## 2023-03-30 HISTORY — PX: ROBOTIC ASSISTED LAPAROSCOPIC LYSIS OF ADHESION: SHX6080

## 2023-03-30 HISTORY — PX: ROBOTIC ASSISTED BILATERAL SALPINGO OOPHERECTOMY: SHX6078

## 2023-03-30 HISTORY — DX: Personal history of irradiation: Z92.3

## 2023-03-30 HISTORY — DX: Exercise induced bronchospasm: J45.990

## 2023-03-30 HISTORY — DX: Personal history of other diseases of the female genital tract: Z87.42

## 2023-03-30 HISTORY — DX: Presence of spectacles and contact lenses: Z97.3

## 2023-03-30 SURGERY — SALPINGO-OOPHORECTOMY, BILATERAL, ROBOT-ASSISTED
Anesthesia: General | Site: Pelvis

## 2023-03-30 MED ORDER — LACTATED RINGERS IV SOLN: INTRAVENOUS | Status: DC | PRN

## 2023-03-30 MED ORDER — ACETAMINOPHEN 500 MG PO TABS
ORAL_TABLET | ORAL | Status: AC
  Filled 2023-03-30: qty 2

## 2023-03-30 MED ORDER — LACTATED RINGERS IV SOLN
INTRAVENOUS | Status: AC
Start: 2023-03-30 — End: 2023-03-30

## 2023-03-30 MED ORDER — KETOROLAC TROMETHAMINE 30 MG/ML IJ SOLN
INTRAMUSCULAR | Status: DC | PRN
  Administered 2023-03-30: 30 mg via INTRAVENOUS

## 2023-03-30 MED ORDER — ONDANSETRON HCL 4 MG/2ML IJ SOLN: 4.0000 mg | Freq: Once | INTRAMUSCULAR | Status: DC | PRN

## 2023-03-30 MED ORDER — FENTANYL CITRATE (PF) 100 MCG/2ML IJ SOLN: 25.0000 ug | INTRAMUSCULAR | Status: DC | PRN

## 2023-03-30 MED ORDER — PROPOFOL 10 MG/ML IV BOLUS
INTRAVENOUS | Status: DC | PRN
  Administered 2023-03-30: 130 mg via INTRAVENOUS

## 2023-03-30 MED ORDER — GABAPENTIN 300 MG PO CAPS
ORAL_CAPSULE | ORAL | Status: AC
  Filled 2023-03-30: qty 1

## 2023-03-30 MED ORDER — GABAPENTIN 300 MG PO CAPS
300.0000 mg | ORAL_CAPSULE | ORAL | Status: AC
  Administered 2023-03-30: 300 mg via ORAL

## 2023-03-30 MED ORDER — STERILE WATER FOR IRRIGATION IR SOLN
Status: DC | PRN
  Administered 2023-03-30: 500 mL

## 2023-03-30 MED ORDER — LACTATED RINGERS IV SOLN: INTRAVENOUS | Status: DC

## 2023-03-30 MED ORDER — POVIDONE-IODINE 10 % EX SWAB: 2.0000 | Freq: Once | CUTANEOUS | Status: DC

## 2023-03-30 MED ORDER — BUPIVACAINE HCL (PF) 0.5 % IJ SOLN
INTRAMUSCULAR | Status: DC | PRN
  Administered 2023-03-30: 15 mL

## 2023-03-30 MED ORDER — EPHEDRINE SULFATE-NACL 50-0.9 MG/10ML-% IV SOSY
PREFILLED_SYRINGE | INTRAVENOUS | Status: DC | PRN
  Administered 2023-03-30: 5 mg via INTRAVENOUS

## 2023-03-30 MED ORDER — FENTANYL CITRATE (PF) 250 MCG/5ML IJ SOLN
INTRAMUSCULAR | Status: DC | PRN
  Administered 2023-03-30 (×3): 50 ug via INTRAVENOUS

## 2023-03-30 MED ORDER — PROPOFOL 10 MG/ML IV BOLUS
INTRAVENOUS | Status: AC
  Filled 2023-03-30: qty 20

## 2023-03-30 MED ORDER — SODIUM CHLORIDE 0.9 % IV SOLN
INTRAVENOUS | Status: AC
  Filled 2023-03-30: qty 100

## 2023-03-30 MED ORDER — EPHEDRINE 5 MG/ML INJ
INTRAVENOUS | Status: AC
  Filled 2023-03-30: qty 5

## 2023-03-30 MED ORDER — ROCURONIUM BROMIDE 10 MG/ML (PF) SYRINGE
PREFILLED_SYRINGE | INTRAVENOUS | Status: DC | PRN
  Administered 2023-03-30: 70 mg via INTRAVENOUS

## 2023-03-30 MED ORDER — OXYCODONE HCL 5 MG/5ML PO SOLN: 5.0000 mg | Freq: Once | ORAL | Status: DC | PRN

## 2023-03-30 MED ORDER — SUGAMMADEX SODIUM 200 MG/2ML IV SOLN
INTRAVENOUS | Status: DC | PRN
  Administered 2023-03-30 (×2): 200 mg via INTRAVENOUS

## 2023-03-30 MED ORDER — DEXMEDETOMIDINE HCL IN NACL 80 MCG/20ML IV SOLN
INTRAVENOUS | Status: DC | PRN
  Administered 2023-03-30: 8 ug via INTRAVENOUS

## 2023-03-30 MED ORDER — SODIUM CHLORIDE 0.9 % IV SOLN
Freq: Once | INTRAVENOUS | Status: AC
  Administered 2023-03-30: 150 mL
  Filled 2023-03-30 (×3): qty 10

## 2023-03-30 MED ORDER — OXYCODONE HCL 5 MG PO TABS: 5.0000 mg | ORAL_TABLET | Freq: Once | ORAL | Status: DC | PRN

## 2023-03-30 MED ORDER — ONDANSETRON HCL 4 MG/2ML IJ SOLN
INTRAMUSCULAR | Status: DC | PRN
  Administered 2023-03-30: 4 mg via INTRAVENOUS

## 2023-03-30 MED ORDER — MIDAZOLAM HCL 2 MG/2ML IJ SOLN
INTRAMUSCULAR | Status: DC | PRN
  Administered 2023-03-30: 2 mg via INTRAVENOUS

## 2023-03-30 MED ORDER — CEFOXITIN SODIUM 2 G IV SOLR
INTRAVENOUS | Status: AC
  Filled 2023-03-30: qty 2

## 2023-03-30 MED ORDER — MIDAZOLAM HCL 2 MG/2ML IJ SOLN
INTRAMUSCULAR | Status: AC
  Filled 2023-03-30: qty 2

## 2023-03-30 MED ORDER — ACETAMINOPHEN 10 MG/ML IV SOLN
1000.0000 mg | Freq: Once | INTRAVENOUS | Status: DC | PRN
Start: 2023-03-30 — End: 2023-03-30

## 2023-03-30 MED ORDER — ACETAMINOPHEN 500 MG PO TABS
1000.0000 mg | ORAL_TABLET | ORAL | Status: AC
  Administered 2023-03-30: 1000 mg via ORAL

## 2023-03-30 MED ORDER — FENTANYL CITRATE (PF) 250 MCG/5ML IJ SOLN
INTRAMUSCULAR | Status: AC
  Filled 2023-03-30: qty 5

## 2023-03-30 MED ORDER — DEXAMETHASONE SODIUM PHOSPHATE 10 MG/ML IJ SOLN
INTRAMUSCULAR | Status: DC | PRN
  Administered 2023-03-30: 10 mg via INTRAVENOUS

## 2023-03-30 MED ORDER — SODIUM CHLORIDE 0.9 % IV SOLN
2.0000 g | INTRAVENOUS | Status: AC
  Administered 2023-03-30: 2 g via INTRAVENOUS

## 2023-03-30 MED ORDER — LIDOCAINE 2% (20 MG/ML) 5 ML SYRINGE
INTRAMUSCULAR | Status: DC | PRN
  Administered 2023-03-30: 100 mg via INTRAVENOUS

## 2023-03-30 SURGICAL SUPPLY — 62 items
ADH SKN CLS APL DERMABOND .7 (GAUZE/BANDAGES/DRESSINGS) ×2
APL SRG 38 LTWT LNG FL B (MISCELLANEOUS)
APPLICATOR ARISTA FLEXITIP XL (MISCELLANEOUS) IMPLANT
BARRIER ADHS 3X4 INTERCEED (GAUZE/BANDAGES/DRESSINGS) IMPLANT
BRR ADH 4X3 ABS CNTRL BYND (GAUZE/BANDAGES/DRESSINGS)
CATH FOLEY 3WAY 5CC 16FR (CATHETERS) ×2 IMPLANT
COVER BACK TABLE 60X90IN (DRAPES) ×2 IMPLANT
COVER TIP SHEARS 8 DVNC (MISCELLANEOUS) ×2 IMPLANT
DEFOGGER SCOPE WARMER CLEARIFY (MISCELLANEOUS) ×2 IMPLANT
DERMABOND ADVANCED .7 DNX12 (GAUZE/BANDAGES/DRESSINGS) ×2 IMPLANT
DRAPE ARM DVNC X/XI (DISPOSABLE) ×8 IMPLANT
DRAPE COLUMN DVNC XI (DISPOSABLE) ×2 IMPLANT
DRAPE UTILITY XL STRL (DRAPES) ×2 IMPLANT
DRIVER NDL MEGA SUTCUT DVNCXI (INSTRUMENTS) IMPLANT
DRIVER NDLE MEGA SUTCUT DVNCXI (INSTRUMENTS)
DURAPREP 26ML APPLICATOR (WOUND CARE) ×2 IMPLANT
ELECT REM PT RETURN 9FT ADLT (ELECTROSURGICAL) ×2
ELECTRODE REM PT RTRN 9FT ADLT (ELECTROSURGICAL) ×2 IMPLANT
FORCEPS PROGRASP DVNC XI (FORCEP) IMPLANT
GAUZE 4X4 16PLY ~~LOC~~+RFID DBL (SPONGE) IMPLANT
GLOVE NEODERM STER SZ 7 (GLOVE) ×6 IMPLANT
GLOVE SURG SS PI 7.5 STRL IVOR (GLOVE) IMPLANT
GYRUS RUMI II 2.5CM BLUE (DISPOSABLE)
GYRUS RUMI II 3.5CM BLUE (DISPOSABLE)
GYRUS RUMI II 4.0CM BLUE (DISPOSABLE)
HEMOSTAT ARISTA ABSORB 3G PWDR (HEMOSTASIS) IMPLANT
HOLDER FOLEY CATH W/STRAP (MISCELLANEOUS) IMPLANT
IRRIG SUCT STRYKERFLOW 2 WTIP (MISCELLANEOUS) ×2
IRRIGATION SUCT STRKRFLW 2 WTP (MISCELLANEOUS) ×2 IMPLANT
KIT PINK PAD W/HEAD ARE REST (MISCELLANEOUS) ×2
KIT PINK PAD W/HEAD ARM REST (MISCELLANEOUS) ×2 IMPLANT
KIT TURNOVER CYSTO (KITS) ×2 IMPLANT
LEGGING LITHOTOMY PAIR STRL (DRAPES) ×2 IMPLANT
MANIFOLD NEPTUNE II (INSTRUMENTS) ×2 IMPLANT
OBTURATOR OPTICAL STND 8 DVNC (TROCAR) ×2
OBTURATOR OPTICALSTD 8 DVNC (TROCAR) ×2 IMPLANT
PACK ROBOT WH (CUSTOM PROCEDURE TRAY) ×2 IMPLANT
PACK ROBOTIC GOWN (GOWN DISPOSABLE) ×2 IMPLANT
PAD OB MATERNITY 4.3X12.25 (PERSONAL CARE ITEMS) ×2 IMPLANT
RUMI II 3.0CM BLUE KOH-EFFICIE (DISPOSABLE) IMPLANT
RUMI II GYRUS 2.5CM BLUE (DISPOSABLE) IMPLANT
RUMI II GYRUS 3.5CM BLUE (DISPOSABLE) IMPLANT
RUMI II GYRUS 4.0CM BLUE (DISPOSABLE) IMPLANT
SCISSORS MNPLR CVD DVNC XI (INSTRUMENTS) IMPLANT
SCOPETTES 8 STERILE (MISCELLANEOUS) IMPLANT
SEAL UNIV 5-12 XI (MISCELLANEOUS) ×6 IMPLANT
SEALER VESSEL EXT DVNC XI (MISCELLANEOUS) IMPLANT
SET IRRIG Y TYPE TUR BLADDER L (SET/KITS/TRAYS/PACK) ×2 IMPLANT
SET TUBE SMOKE EVAC HIGH FLOW (TUBING) ×2 IMPLANT
SPIKE FLUID TRANSFER (MISCELLANEOUS) ×2 IMPLANT
SUT MNCRL AB 4-0 PS2 18 (SUTURE) ×2 IMPLANT
SUT VLOC 180 0 9IN GS21 (SUTURE) ×2 IMPLANT
SYS RETRIEVAL 5MM INZII UNIV (BASKET) ×2
SYSTEM RETRIEVL 5MM INZII UNIV (BASKET) IMPLANT
TIP RUMI ORANGE 6.7MMX12CM (TIP) IMPLANT
TIP UTERINE 5.1X6CM LAV DISP (MISCELLANEOUS) IMPLANT
TIP UTERINE 6.7X10CM GRN DISP (MISCELLANEOUS) IMPLANT
TIP UTERINE 6.7X6CM WHT DISP (MISCELLANEOUS) IMPLANT
TIP UTERINE 6.7X8CM BLUE DISP (MISCELLANEOUS) IMPLANT
TOWEL OR 17X24 6PK STRL BLUE (TOWEL DISPOSABLE) ×2 IMPLANT
UNDERPAD 30X36 HEAVY ABSORB (UNDERPADS AND DIAPERS) ×2 IMPLANT
WATER STERILE IRR 500ML POUR (IV SOLUTION) ×2 IMPLANT

## 2023-03-30 NOTE — Discharge Instructions (Addendum)
  Post Anesthesia Home Care Instructions  Activity: Get plenty of rest for the remainder of the day. A responsible individual must stay with you for 24 hours following the procedure.  For the next 24 hours, DO NOT: -Drive a car -Advertising copywriter -Drink alcoholic beverages -Take any medication unless instructed by your physician -Make any legal decisions or sign important papers.  Meals: Start with liquid foods such as gelatin or soup. Progress to regular foods as tolerated. Avoid greasy, spicy, heavy foods. If nausea and/or vomiting occur, drink only clear liquids until the nausea and/or vomiting subsides. Call your physician if vomiting continues.  Special Instructions/Symptoms: Your throat may feel dry or sore from the anesthesia or the breathing tube placed in your throat during surgery. If this causes discomfort, gargle with warm salt water. The discomfort should disappear within 24 hours.      No acetaminophen/Tylenol until after 3:45 pm today if needed.  No ibuprofen, Advil, Aleve, Motrin, ketorolac, meloxicam, naproxen, or other NSAIDS until after 6:30 pm today if needed.

## 2023-03-30 NOTE — Anesthesia Postprocedure Evaluation (Signed)
Anesthesia Post Note  Patient: Nichole [name]  Procedure(s) Performed: XI ROBOTIC ASSISTED RIGHT  OOPHORECTOMY (Bilateral: Pelvis) XI ROBOTIC ASSISTED LAPAROSCOPIC LYSIS OF ADHESION (Pelvis)     Patient location during evaluation: PACU Anesthesia Type: General Level of consciousness: awake and alert Pain management: pain level controlled Vital Signs Assessment: post-procedure vital signs reviewed and stable Respiratory status: spontaneous breathing, nonlabored ventilation, respiratory function stable and patient connected to nasal cannula oxygen Cardiovascular status: blood pressure returned to baseline and stable Postop Assessment: no apparent nausea or vomiting Anesthetic complications: no   No notable events documented.  Last Vitals:  Vitals:   03/30/23 1300 03/30/23 1315  BP: 103/74 129/80  Pulse: 65 74  Resp: 14 14  Temp:    SpO2: 94% 96%    Last Pain:  Vitals:   03/30/23 1315  TempSrc:   PainSc: 0-No pain                 Mariann Barter

## 2023-03-30 NOTE — Anesthesia Procedure Notes (Signed)
Procedure Name: Intubation Date/Time: 03/30/2023 11:27 AM  Performed by: Dairl Ponder, CRNAPre-anesthesia Checklist: Patient identified, Emergency Drugs available, Suction available and Patient being monitored Patient Re-evaluated:Patient Re-evaluated prior to induction Oxygen Delivery Method: Circle System Utilized Preoxygenation: Pre-oxygenation with 100% oxygen Induction Type: IV induction Ventilation: Mask ventilation without difficulty Laryngoscope Size: Mac and 3 Grade View: Grade II Tube type: Oral Tube size: 7.0 mm Number of attempts: 1 Airway Equipment and Method: Stylet and Oral airway Placement Confirmation: ETT inserted through vocal cords under direct vision, positive ETCO2 and breath sounds checked- equal and bilateral Secured at: 22 cm Tube secured with: Tape Dental Injury: Teeth and Oropharynx as per pre-operative assessment

## 2023-03-30 NOTE — Progress Notes (Signed)
Charted at 44

## 2023-03-30 NOTE — Anesthesia Preprocedure Evaluation (Signed)
Anesthesia Evaluation  Patient identified by MRN, date of birth, ID band Patient awake    Reviewed: Allergy & Precautions, NPO status , Patient's Chart, lab work & pertinent test results, reviewed documented beta blocker date and time   History of Anesthesia Complications Negative for: history of anesthetic complications  Airway Mallampati: IV  TM Distance: >3 FB Neck ROM: Full    Dental no notable dental hx.    Pulmonary asthma , former smoker   breath sounds clear to auscultation       Cardiovascular METS(-) hypertension(-) angina (-) CAD, (-) Past MI, (-) Cardiac Stents and (-) CABG  Rhythm:Regular Rate:Normal     Neuro/Psych neg Seizures  Neuromuscular disease    GI/Hepatic ,neg GERD  ,,(+) neg Cirrhosis        Endo/Other  neg diabetes    Renal/GU Renal disease     Musculoskeletal   Abdominal   Peds  Hematology   Anesthesia Other Findings   Reproductive/Obstetrics                             Anesthesia Physical Anesthesia Plan  ASA: 2  Anesthesia Plan: General   Post-op Pain Management:    Induction: Intravenous  PONV Risk Score and Plan: 2 and Ondansetron  Airway Management Planned: Oral ETT  Additional Equipment:   Intra-op Plan:   Post-operative Plan:   Informed Consent: I have reviewed the patients History and Physical, chart, labs and discussed the procedure including the risks, benefits and alternatives for the proposed anesthesia with the patient or authorized representative who has indicated his/her understanding and acceptance.     Dental advisory given  Plan Discussed with: CRNA  Anesthesia Plan Comments:        Anesthesia Quick Evaluation

## 2023-03-30 NOTE — Interval H&P Note (Signed)
History and Physical Interval Note:  03/30/2023 11:08 AM  Nichole [name]  has presented today for surgery, with the diagnosis of Malignant neoplasm of nipple of right breast in female, unspecified estrogen receptor status.  The various methods of treatment have been discussed with the patient and family. After consideration of risks, benefits and other options for treatment, the patient has consented to  Procedure(s): XI ROBOTIC ASSISTED BILATERAL SALPINGO OOPHORECTOMY (Bilateral) XI ROBOTIC ASSISTED LAPAROSCOPIC LYSIS OF ADHESION (N/A) as a surgical intervention.  The patient's history has been reviewed, patient examined, no change in status, stable for surgery.  I have reviewed the patient's chart and labs.  Questions were answered to the patient's satisfaction.     Earley Favor

## 2023-03-30 NOTE — Transfer of Care (Signed)
Immediate Anesthesia Transfer of Care Note  Patient: Nichole [name]  Procedure(s) Performed: XI ROBOTIC ASSISTED RIGHT  OOPHORECTOMY (Bilateral: Pelvis) XI ROBOTIC ASSISTED LAPAROSCOPIC LYSIS OF ADHESION (Pelvis)  Patient Location: PACU  Anesthesia Type:General  Level of Consciousness: drowsy and patient cooperative  Airway & Oxygen Therapy: Patient Spontanous Breathing and Patient connected to nasal cannula oxygen  Post-op Assessment: Report given to RN and Post -op Vital signs reviewed and stable  Post vital signs: Reviewed and stable  Last Vitals:  Vitals Value Taken Time  BP 122/82 03/30/23 1247  Temp    Pulse 66 03/30/23 1250  Resp 30 03/30/23 1250  SpO2 96 % 03/30/23 1250  Vitals shown include unfiled device data.  Last Pain:  Vitals:   03/30/23 0934  TempSrc: Oral  PainSc: 0-No pain      Patients Stated Pain Goal: 4 (03/30/23 0934)  Complications: No notable events documented.

## 2023-03-31 LAB — SURGICAL PATHOLOGY

## 2023-03-31 NOTE — Op Note (Signed)
03/31/2023   [id number]  Nichole [name]       OPERATIVE REPORT   Preop Diagnosis: breast cancer and would like to reduce her risk for recurrence, pelvic pain, history of hysterectomy and LSO Procedure: robotic operative right oophorectomy   Surgeon: Dr. Orvil Feil Pennye Beeghly Assistant: Donne Hazel, RN   Fluids: please see anesthesia report   Complications: None Anesthesia: General     Findings:  Evidence of prior hysterectomy and LSO, normal right ovary. No remaining right tube   Estimated blood loss: Minimal <25cc   Specimens: right ovary   Disposition of specimen: Pathology           Patient is taken to the operating room. She is placed in the supine position. She is a running IV in place. Informed consent was present on the chart. SCDs on her lower extremities and functioning properly. Patient was positioned while she was awake.  Her legs were placed in the low lithotomy position in Winton stirrups. Her arms were tucked by the side.  General endotracheal anesthesia was administered by the anesthesia staff without difficulty.       Chlora prep was then used to prep the abdomen and Hibiclens was used to prep the inner thighs, perineum and vagina. Once 3 minutes had past the patient was draped in a normal standard fashion. A proper time out was performed and everyone agreed.    Attention was turned to the vagina and a sponge on a stick was used to lift the vaginal cuff during the procedure. A Foley catheter was placed to straight drain.  Clear urine was noted. Legs were lowered to the low lithotomy position and attention was turned the abdomen.   Superior to the umbilicus, marcaine 0.25% used to anesthetize the skin.  Using #11 blade, 8mm skin incision was made.  The 8mm robotic trocar and sleeve was inserted under direct visualization.  CO2 gas was  started and patient was placed in trendelenburg position.  Two additional 8mm ports were placed under direct  visualization in the left and right lower quadrant.     Ureters were identified.  The left ovary and tube and uterus were removed at a prior surgery.   Attention was turned the right side.   There was one adhesions from the ovary to the side wall this was released.  The right IP ligament was away from the ureter and this was desiccated and transected with the vessel sealer.  The right ovary was placed in an endocatch bag and delivered through the 8mm port incision and sent to pathology.    The pelvis was irrigated. No bleeding was noted.   CO2 pressures were lowered to 8mm Hg.  Again, no bleeding was noted.  Ureters were noted deep in the pelvis to be peristalsing.  At this point the procedure was completed.  The remaining instruments were removed.  The ports were removed under direct visualization of the laparoscope and the pneumoperitoneum was relieved.   The skin was then closed with subcuticular stitches of 3-0 Vicryl. The skin was cleansed Dermabond was applied. Attention was then turned the vagina and the sponge on a stick and the Foley catheter was removed.   Sponge, lap, needle, instrument counts were correct x2. Patient tolerated the procedure very well. She was awakened from anesthesia, extubated and taken to recovery in stable condition.      Dr. Karma Greaser

## 2023-04-01 ENCOUNTER — Encounter (HOSPITAL_BASED_OUTPATIENT_CLINIC_OR_DEPARTMENT_OTHER): Payer: Self-pay | Admitting: Obstetrics and Gynecology

## 2023-04-01 ENCOUNTER — Telehealth: Payer: Self-pay | Admitting: *Deleted

## 2023-04-01 NOTE — Telephone Encounter (Signed)
Earley Favor, MD  You15 minutes ago (2:06 PM)    Yes, can be expected.  Watch for a bladder infection and s/s such as fevers, dysuria, frequency.   Dr. Karma Greaser   Returned call to patient. Message given to patient as seen above from Dr. Karma Greaser and patient verbalized understanding. Encounter closed.

## 2023-04-01 NOTE — Telephone Encounter (Signed)
Returned call to patient. Patient left voicemail on triage line stating she was having some spotting with wiping that started today. XI ROBOTIC ASSISTED RIGHT  OOPHORECTOMY/XI ROBOTIC ASSISTED LAPAROSCOPIC LYSIS OF ADHESION performed on 03-30-23. Patient denies pain (outside minimal surgical soreness), discharge, dysuria, nausea/vomiting and fever/chills. Patient states noticed spotting when she wiped. RN advised some spotting is expected following gynecological surgery. Bleeding precautions of saturating a pad/tampon per hour reviewed with patient. Advised patient to wear a pad/liner to monitor bleeding. Advised would review with Dr. Karma Greaser and return call with additional recommendations. Patient agreeable.   Routing to provider for review.

## 2023-04-05 ENCOUNTER — Telehealth: Payer: Self-pay

## 2023-04-05 ENCOUNTER — Other Ambulatory Visit (HOSPITAL_COMMUNITY): Payer: Self-pay

## 2023-04-05 ENCOUNTER — Encounter: Payer: Self-pay | Admitting: Hematology and Oncology

## 2023-04-05 ENCOUNTER — Telehealth: Payer: Self-pay | Admitting: Hematology and Oncology

## 2023-04-05 ENCOUNTER — Telehealth: Payer: Self-pay | Admitting: Pharmacy Technician

## 2023-04-05 ENCOUNTER — Other Ambulatory Visit: Payer: Self-pay

## 2023-04-05 NOTE — Telephone Encounter (Signed)
Oral Oncology Patient Advocate Encounter  Prior Authorization for [name] has been approved.    PA# [id number] Effective dates: 04/05/23 through 04/04/24  Patient can continue to fill at [facility].    Jinger Neighbors, CPhT-Adv Oncology Pharmacy Patient Advocate [facility] Direct Number: [phone]  Fax: [phone]

## 2023-04-05 NOTE — Telephone Encounter (Signed)
Patient is aware of scheduled appointment times/dates for follow up

## 2023-04-05 NOTE — Telephone Encounter (Signed)
Called patient to let her know she needs to come in to see Dr. Al Pimple and while we were on the phone, she asked about her monthly labs and zoladex.  Informed her that we would cancel those appts for 11/15 and she would be scheduled for first available lab and follow up with Dr. Al Pimple. Lorayne Marek, RN

## 2023-04-05 NOTE — Telephone Encounter (Signed)
Oral Oncology Patient Advocate Encounter   Received notification that prior authorization for Verzenio is due for renewal.   PA submitted on 04/05/23 Key [id number] Status is pending     .Jinger Neighbors, CPhT-Adv Oncology Pharmacy Patient Advocate [facility] Direct Number: [phone]  Fax: [phone]

## 2023-04-05 NOTE — Telephone Encounter (Signed)
Luvenia Starch from [facility] called to let us know that [facility] Verzenio was not approved with her insurance due to lack of needed medical information.  Confirmed with Charmane via a phone call that she is still taking the medication. Per [facility], we need to issue an appeal at [phone].  To unhold the medication, then call [phone].   Routing to pharmacy, Mardella Layman, and Dr. Al Pimple to address appeal.  It has been approved in the past, so not sure why it was denied. Lorayne Marek, RN

## 2023-04-08 DIAGNOSIS — Z9884 Bariatric surgery status: Secondary | ICD-10-CM | POA: Diagnosis not present

## 2023-04-09 ENCOUNTER — Inpatient Hospital Stay: Payer: BC Managed Care – PPO

## 2023-04-09 ENCOUNTER — Other Ambulatory Visit: Payer: Self-pay | Admitting: Hematology and Oncology

## 2023-04-09 DIAGNOSIS — Z170 Estrogen receptor positive status [ER+]: Secondary | ICD-10-CM

## 2023-04-13 ENCOUNTER — Ambulatory Visit (INDEPENDENT_AMBULATORY_CARE_PROVIDER_SITE_OTHER): Payer: BC Managed Care – PPO | Admitting: Obstetrics and Gynecology

## 2023-04-13 ENCOUNTER — Encounter: Payer: Self-pay | Admitting: Obstetrics and Gynecology

## 2023-04-13 VITALS — BP 122/82 | HR 82

## 2023-04-13 DIAGNOSIS — Z09 Encounter for follow-up examination after completed treatment for conditions other than malignant neoplasm: Secondary | ICD-10-CM

## 2023-04-13 MED ORDER — VEOZAH 45 MG PO TABS: 1.0000 | ORAL_TABLET | Freq: Every day | ORAL | 3 refills | Status: DC

## 2023-04-13 NOTE — Progress Notes (Signed)
Patient presents for 2 week postop from robotic RSO She is doing well. No fevers dysuria or severe abdominal pain.  Blood pressure 104/70, pulse 72, weight 184 lb (83.5 kg), last menstrual period 02/24/2023, SpO2 100%.  Abdomen: incisions I/c/d, NT, ND  A/p PO 2 weeks doing well Resume all activities Veozzah sent for hot flashes  Dr. Karma Greaser

## 2023-04-15 ENCOUNTER — Inpatient Hospital Stay: Payer: BC Managed Care – PPO | Admitting: Hematology and Oncology

## 2023-04-19 ENCOUNTER — Inpatient Hospital Stay: Payer: BC Managed Care – PPO | Attending: Hematology and Oncology

## 2023-04-19 ENCOUNTER — Inpatient Hospital Stay (HOSPITAL_BASED_OUTPATIENT_CLINIC_OR_DEPARTMENT_OTHER): Payer: BC Managed Care – PPO | Admitting: Hematology and Oncology

## 2023-04-19 VITALS — BP 141/71 | HR 88 | Temp 98.2°F | Resp 16 | Wt 227.7 lb

## 2023-04-19 DIAGNOSIS — Z170 Estrogen receptor positive status [ER+]: Secondary | ICD-10-CM | POA: Diagnosis not present

## 2023-04-19 DIAGNOSIS — Z923 Personal history of irradiation: Secondary | ICD-10-CM | POA: Diagnosis not present

## 2023-04-19 DIAGNOSIS — C50812 Malignant neoplasm of overlapping sites of left female breast: Secondary | ICD-10-CM

## 2023-04-19 DIAGNOSIS — Z803 Family history of malignant neoplasm of breast: Secondary | ICD-10-CM | POA: Insufficient documentation

## 2023-04-19 DIAGNOSIS — Z8051 Family history of malignant neoplasm of kidney: Secondary | ICD-10-CM | POA: Insufficient documentation

## 2023-04-19 DIAGNOSIS — Z79810 Long term (current) use of selective estrogen receptor modulators (SERMs): Secondary | ICD-10-CM | POA: Insufficient documentation

## 2023-04-19 DIAGNOSIS — Z87891 Personal history of nicotine dependence: Secondary | ICD-10-CM | POA: Insufficient documentation

## 2023-04-19 LAB — CBC WITH DIFFERENTIAL/PLATELET
Abs Immature Granulocytes: 0.01 10*3/uL (ref 0.00–0.07)
Basophils Absolute: 0 10*3/uL (ref 0.0–0.1)
Basophils Relative: 0 %
Eosinophils Absolute: 0.1 10*3/uL (ref 0.0–0.5)
Eosinophils Relative: 2 %
HCT: 34.2 % — ABNORMAL LOW (ref 36.0–46.0)
Hemoglobin: 11.7 g/dL — ABNORMAL LOW (ref 12.0–15.0)
Immature Granulocytes: 0 %
Lymphocytes Relative: 40 %
Lymphs Abs: 1 10*3/uL (ref 0.7–4.0)
MCH: 31.8 pg (ref 26.0–34.0)
MCHC: 34.2 g/dL (ref 30.0–36.0)
MCV: 92.9 fL (ref 80.0–100.0)
Monocytes Absolute: 0.2 10*3/uL (ref 0.1–1.0)
Monocytes Relative: 9 %
Neutro Abs: 1.2 10*3/uL — ABNORMAL LOW (ref 1.7–7.7)
Neutrophils Relative %: 49 %
Platelets: 229 10*3/uL (ref 150–400)
RBC: 3.68 MIL/uL — ABNORMAL LOW (ref 3.87–5.11)
RDW: 12.2 % (ref 11.5–15.5)
WBC: 2.6 10*3/uL — ABNORMAL LOW (ref 4.0–10.5)
nRBC: 0 % (ref 0.0–0.2)

## 2023-04-19 LAB — COMPREHENSIVE METABOLIC PANEL
ALT: 14 U/L (ref 0–44)
AST: 14 U/L — ABNORMAL LOW (ref 15–41)
Albumin: 3.9 g/dL (ref 3.5–5.0)
Alkaline Phosphatase: 64 U/L (ref 38–126)
Anion gap: 5 (ref 5–15)
BUN: 15 mg/dL (ref 6–20)
CO2: 28 mmol/L (ref 22–32)
Calcium: 9.1 mg/dL (ref 8.9–10.3)
Chloride: 108 mmol/L (ref 98–111)
Creatinine, Ser: 0.86 mg/dL (ref 0.44–1.00)
GFR, Estimated: >60 mL/min (ref >60)
Glucose, Bld: 107 mg/dL — ABNORMAL HIGH (ref 70–99)
Potassium: 3.7 mmol/L (ref 3.5–5.1)
Sodium: 141 mmol/L (ref 135–145)
Total Bilirubin: 0.4 mg/dL (ref <1.2)
Total Protein: 7.3 g/dL (ref 6.5–8.1)

## 2023-04-19 MED ORDER — NYSTATIN 100000 UNIT/GM EX POWD
1.0000 | Freq: Three times a day (TID) | CUTANEOUS | 0 refills | Status: DC
Start: 2023-04-19 — End: 2023-10-28

## 2023-04-19 NOTE — Assessment & Plan Note (Addendum)
This is a very pleasant 47 year old female patient, premenopausal with family history of breast cancer in mom who noticed breast abnormality in her left breast in December point for further investigation.  She had a mammogram, diagnostic mammogram as well as ultrasound which noticed an abnormality at the 12 o'clock position in the left breast measuring around 20 mm in its largest dimension along with a satellite nodule in the same breast at around 11 o'clock position measuring 4 x 3 x 6 mm without any enlarged left axillary lymphadenopathy.   Biopsy from these masses showed invasive mammary carcinoma, grade 2, prognostics ER 50% moderate staining, PR 95% strong staining, KI less than 5% HER2 negative on the larger mass and similar prognostics on the second mass.    Given the intermediate grade, strong ER/PR positivity, low proliferation index and HER2 negative tumor, we discussed about upfront surgery followed by consideration for Oncotype testing. However on the final pathology, it was thought that all her tumor was indeed 1 large mass measuring at least 5 cm, 2 out of 4 sentinel lymph nodes involved with tumor.  Final pathologic staging was PT3N1a.  Prognostics not repeated on the final sample.  Reexcision showed tumor measuring 2 mm.    She refused adjuvant chemotherapy.  She is now on tamoxifen and abemaciclib.  We reduced the dose of verzenio to 100 mg po BID.  Breast Cancer Patient is on Tamoxifen and Verzenio 100mg  with improved tolerance. No significant GI side effects reported. Blood counts are low but stable. -Continue Tamoxifen and Verzenio 100mg . -Check metabolic panel results when available. -Plan for mammogram in February.  Post-Surgical Recovery Patient underwent recent gynecological surgery with some residual soreness but overall good recovery. Hot flashes persist. -Discontinue injections, goserelin -Monitor for any post-surgical complications.  Joint Pain Patient reports  persistent joint pain, particularly in the hip, which is worse upon waking and after periods of inactivity. No known history of arthritis or injury. -Continue to monitor symptoms.  Skin Irritation Patient reports sweating and skin irritation under the breasts. -Prescribe Nystatin powder for application under the breasts to prevent fungal rash.  Follow-up -Return in 8 weeks for follow-up, potentially with clinical pharmacist. -Alternate appointments with clinical pharmacist and physician as patient is stable on current dose of Verzenio.

## 2023-04-19 NOTE — Progress Notes (Signed)
[facility] CONSULT NOTE  Patient Care Team: Doreene Nest, NP as PCP - General (Nurse Practitioner) Rachel Moulds, MD as Consulting Physician (Hematology and Oncology) Dorothy Puffer, MD as Consulting Physician (Radiation Oncology) Harriette Bouillon, MD as Consulting Physician (General Surgery) Tanda Rockers, NP as Nurse Practitioner (Obstetrics and Gynecology)  CHIEF COMPLAINTS/PURPOSE OF CONSULTATION:  Newly diagnosed breast cancer  HISTORY OF PRESENTING ILLNESS:   Nichole [name] 47 y.o. female is here because of recent diagnosis of left breast IDC  SUMMARY OF ONCOLOGIC HISTORY: Oncology History  Malignant neoplasm of overlapping sites of left breast in female, estrogen receptor positive (HCC)  07/08/2021 Mammogram    Screening mammogram 07/08/2021 showed possible mass with distortion in the left breast. 07/21/2021 she had a left diagnostic mammogram which showed a suspicious left breast mass at 12 o'clock position suspicious satellite nodule left breast at 11 o'clock position Ultrasound same day showed a 16 x 20 x 12 mm irregular hypoechoic mass left breast at 12 o'clock position 4 cm from the nipple.  There is an adjacent satellite nodule within the left breast 11 o'clock position 3 cm from nipple measuring 4 x 3 x 6 mm.  No enlarged left axillary lymphadenopathy    07/30/2021 Pathology Results   Pathology from left breast mass 11:00, 3 cm from nipple showed invasive mammary carcinoma grade 2 prognostics ER 50% moderate staining, PR 95% strong staining, Ki-67 less than 5% and HER2 negative.  The second mass at 11:00 also showed similar prognostics and is grade 2.   08/11/2021 Initial Diagnosis   Malignant neoplasm of overlapping sites of left breast in female, estrogen receptor positive (HCC)   08/11/2021 Cancer Staging   Staging form: Breast, AJCC 8th Edition - Pathologic: Stage IB (pT2, pN1, cM0, G2, ER+, PR+, HER2-) - Signed by Rachel Moulds, MD on  10/02/2021 Histologic grading system: 3 grade system   08/29/2021 Genetic Testing   Negative hereditary cancer genetic testing: no pathogenic variants detected in [facility] CancerNext-Expanded +RNAinsight Panel.  Report date is 08/29/2021.   The CancerNext-Expanded gene panel offered by [facility] and includes sequencing, rearrangement, and RNA analysis for the following 77 genes: AIP, ALK, APC, ATM, AXIN2, BAP1, BARD1, BLM, BMPR1A, BRCA1, BRCA2, BRIP1, CDC73, CDH1, CDK4, CDKN1B, CDKN2A, CHEK2, CTNNA1, DICER1, FANCC, FH, FLCN, GALNT12, KIF1B, LZTR1, MAX, MEN1, MET, MLH1, MSH2, MSH3, MSH6, MUTYH, NBN, NF1, NF2, NTHL1, PALB2, PHOX2B, PMS2, POT1, PRKAR1A, PTCH1, PTEN, RAD51C, RAD51D, RB1, RECQL, RET, SDHA, SDHAF2, SDHB, SDHC, SDHD, SMAD4, SMARCA4, SMARCB1, SMARCE1, STK11, SUFU, TMEM127, TP53, TSC1, TSC2, VHL and XRCC2 (sequencing and deletion/duplication); EGFR, EGLN1, HOXB13, KIT, MITF, PDGFRA, POLD1, and POLE (sequencing only); EPCAM and GREM1 (deletion/duplication only).    09/11/2021 Surgery   Left breast lumpectomy showed grade 2 invasive lobular carcinoma, lobular carcinoma in situ showing ductal involvement, invasive tumor focally present in the lateral margin, additional margins very close.  Tumor size greater than 5 cm. Changes consistent with prior biopsies.  Fibrocystic changes including extensive stromal fibrosis and adenosis.  Left axillary lymph node also confirmed metastatic lobular carcinoma.  Final pathologic staging was [id number]   11/03/2021 - 12/19/2021 Radiation Therapy   Site Technique Total Dose (Gy) Dose per Fx (Gy) Completed Fx Beam Energies  Breast, Left: Breast_L 3D 50.4/50.4 1.8 28/28 6X  Breast, Left: Breast_L_SCLV 3D 50.4/50.4 1.8 28/28 6X, 10X  Breast, Left: Breast_L_Bst 3D 10/10 2 5/5 6X     12/2021 -  Anti-estrogen oral therapy   Tamoxifen + Zoladex  Age at first child birth : 46  Nichole [name] is here for a follow-up on Tamoxifen and verzenio started in August  2023.  Discussed the use of AI scribe software for clinical note transcription with the patient, who gave verbal consent to proceed.  History of Present Illness    The patient, with a history of breast cancer, presents for a follow-up after recent BSO. She reports feeling good post-surgery, with only a little soreness and ongoing hot flashes. The surgery occurred earlier this month and was performed laparoscopically.  The patient's symptoms have improved significantly on a reduced dose of Verzenio (100mg ), with fewer gastrointestinal side effects. She reports occasional nausea within an hour or two of taking the medication, which she manages with ginger to avoid adding another pill to her regimen.  Additionally, the patient reports persistent joint pain, particularly in the hip, which causes a noticeable limp. The pain is worse upon waking and after periods of sitting, but eases with movement. She denies any previous injury to the hip.  Rest of the pertinent 10 point ROS reviewed and negative  MEDICAL HISTORY:  Past Medical History:  Diagnosis Date   Asthma, exercise induced    seasonal   Family history of breast cancer 08/14/2021   Family history of kidney cancer 08/14/2021   History of external beam radiation therapy    left breast cancer  11-03-2021 to  12-19-2021   History of ovarian cyst    Malignant neoplasm of overlapping sites of left breast in female, estrogen receptor positive (HCC) 07/2021   oncologist-- dr Shelita Steptoe/  general surgeon-- dr t. Luisa Hart;  dx 08-11-2021,  Invasive mammary / lobular carcinoma in situ overlapping w/ positive 2 SLN ,  ER/PR+, HER2 negative;  completed radiation 12-19-2021, refused chemo,  started antiestrogen 08/ 2023   Pre-diabetes    hx of   Wears glasses     SURGICAL HISTORY: Past Surgical History:  Procedure Laterality Date   BIOPSY  03/04/2021   Procedure: BIOPSY;  Surgeon: Quentin Ore, MD;  Location: [facility];  Service: General;;    BREAST LUMPECTOMY WITH RADIOACTIVE SEED AND SENTINEL LYMPH NODE BIOPSY Left 09/11/2021   Procedure: LEFT BREAST LUMPECTOMY WITH RADIOACTIVE SEED X3 AND LEFT SENTINEL LYMPH NODE BIOPSY;  Surgeon: Harriette Bouillon, MD;  Location: [facility];  Service: General;  Laterality: Left;   ESOPHAGOGASTRODUODENOSCOPY N/A 03/04/2021   Procedure: ESOPHAGOGASTRODUODENOSCOPY (EGD);  Surgeon: Quentin Ore, MD;  Location: [facility];  Service: General;  Laterality: N/A;   LAPAROSCOPIC GASTRIC SLEEVE RESECTION N/A 05/02/2021   Procedure: LAPAROSCOPIC GASTRIC SLEEVE RESECTION;  Surgeon: Quentin Ore, MD;  Location: [facility];  Service: General;  Laterality: N/A;   LAPAROSCOPIC VAGINAL HYSTERECTOMY WITH SALPINGECTOMY N/A 10/08/2016   Procedure: LAPAROSCOPIC ASSISTED VAGINAL HYSTERECTOMY WITH RIGHT SALPINGECTOMY LYSIS OF ADHESIONS;  Surgeon: Reva Bores, MD;  Location: [facility];  Service: Gynecology;  Laterality: N/A;   MYOMECTOMY ABDOMINAL APPROACH     2010-2011   RE-EXCISION OF BREAST LUMPECTOMY Left 09/24/2021   Procedure: RE-EXCISION OF LEFT BREAST LUMPECTOMY;  Surgeon: Harriette Bouillon, MD;  Location: [facility];  Service: General;  Laterality: Left;   RIGHT OOPHORECTOMY Right 2003   via laparotomy ,  per pt for tumor (cyst) on ovary,  told benign   ROBOTIC ASSISTED BILATERAL SALPINGO OOPHERECTOMY Bilateral 03/30/2023   Procedure: XI ROBOTIC ASSISTED RIGHT  OOPHORECTOMY;  Surgeon: Earley Favor, MD;  Location: [facility];  Service: Gynecology;  Laterality: Bilateral;  ROBOTIC ASSISTED LAPAROSCOPIC LYSIS OF ADHESION N/A 03/30/2023   Procedure: XI ROBOTIC ASSISTED LAPAROSCOPIC LYSIS OF ADHESION;  Surgeon: Earley Favor, MD;  Location: [facility];  Service: Gynecology;  Laterality: N/A;   UPPER GI ENDOSCOPY N/A 05/02/2021   Procedure: UPPER GI ENDOSCOPY;  Surgeon: Quentin Ore, MD;  Location: [facility];  Service: General;  Laterality: N/A;    WISDOM TOOTH EXTRACTION      SOCIAL HISTORY: Social History   Socioeconomic History   Marital status: Divorced    Spouse name: Not on file   Number of children: 1   Years of education: Not on file   Highest education level: Not on file  Occupational History   Not on file  Tobacco Use   Smoking status: Former    Types: Cigarettes    Start date: 2020    Quit date: 1996    Years since quitting: 28.9   Smokeless tobacco: Never   Tobacco comments:    03-25-2023  per pt quit smoking 2020, age teen (approx 31),  smoked for 24 yrs  Vaping Use   Vaping status: Former  Substance and Sexual Activity   Alcohol use: Not Currently    Comment: very rare   Drug use: Not Currently    Comment: not since age 60 (marijuana)   Sexual activity: Not Currently    Birth control/protection: Surgical    Comment: hysterectomy  Other Topics Concern   Not on file  Social History Narrative   Work as a Engineer, 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   Has 64 year old son.   Moved from [place] in Sept. 2015   Social Determinants of Health   Financial Resource Strain: Not on file  Food Insecurity: Not on file  Transportation Needs: Not on file  Physical Activity: Not on file  Stress: Not on file  Social Connections: Not on file  Intimate Partner Violence: Not on file    FAMILY HISTORY: Family History  Problem Relation Age of Onset   Arthritis Mother    Breast cancer Mother 12       Lumpectomy   Hyperlipidemia Mother    Hypertension Mother    Kidney cancer Mother 50       Had ablation   Graves' disease Mother        Had thyroid removed   Graves' disease Brother        Deceased    ALLERGIES:  has No Known Allergies.  MEDICATIONS:  Current Outpatient Medications  Medication Sig Dispense Refill   albuterol (VENTOLIN HFA) 108 (90 Base) MCG/ACT inhaler Inhale 1-2 puffs into the lungs every 6 (six) hours as needed for wheezing or shortness of breath. 1 each 0    Calcium Carbonate (CALCIUM 600 PO) Take by mouth daily. chews     cetirizine (ZYRTEC) 10 MG tablet Take 1 tablet (10 mg total) by mouth at bedtime. For allergies (Patient taking differently: Take 10 mg by mouth as needed for allergies. For allergies) 90 tablet 0   Fezolinetant (VEOZAH) 45 MG TABS Take 1 tablet (45 mg total) by mouth daily. 90 tablet 3   fluticasone (FLONASE) 50 MCG/ACT nasal spray Place 1 spray into both nostrils 2 (two) times daily as needed for allergies or rhinitis. 48 mL 0   ibuprofen (ADVIL) 800 MG tablet Take 1 tablet (800 mg total) by mouth every 8 (eight) hours as needed. 30 tablet 1   Semaglutide-Weight Management  0.25 MG/0.5ML SOAJ Inject into the skin. (Patient not taking: Reported on 04/13/2023)     tamoxifen (NOLVADEX) 20 MG tablet Take 1 tablet (20 mg total) by mouth daily. 90 tablet 3   venlafaxine XR (EFFEXOR-XR) 75 MG 24 hr capsule Take 1 capsule (75 mg total) by mouth daily with breakfast. 90 capsule 3   VERZENIO 100 MG tablet TAKE 1 TABLET BY MOUTH 2 TIMES A DAY 56 tablet 1   Vitamin D-Vitamin K (K2-D3 5000 PO) Take 1 capsule by mouth daily.     No current facility-administered medications for this visit.    PHYSICAL EXAMINATION: ECOG PERFORMANCE STATUS: 0 - Asymptomatic  Vitals:   04/19/23 0850  BP: (!) 141/71  Pulse: 88  Resp: 16  Temp: 98.2 F (36.8 C)  SpO2: 99%      Filed Weights   04/19/23 0850  Weight: 227 lb 11.2 oz (103.3 kg)      BP (!) 141/71 (BP Location: Right Arm, Patient Position: Sitting)   Pulse 88   Temp 98.2 F (36.8 C) (Temporal)   Resp 16   Wt 227 lb 11.2 oz (103.3 kg)   LMP 09/09/2016 (Approximate)   SpO2 99%   BMI 37.31 kg/m   Physical Exam Constitutional:      Appearance: Normal appearance.     Comments: No acute distress  Chest:     Comments: Breast exam, post radiation changes, no palpable masses otherwise. Musculoskeletal:     Cervical back: Normal range of motion and neck supple. No rigidity.   Lymphadenopathy:     Cervical: No cervical adenopathy.  Neurological:     Mental Status: She is alert.    LABORATORY DATA:  I have reviewed the data as listed Lab Results  Component Value Date   WBC 2.6 (L) 04/19/2023   HGB 11.7 (L) 04/19/2023   HCT 34.2 (L) 04/19/2023   MCV 92.9 04/19/2023   PLT 229 04/19/2023   Lab Results  Component Value Date   NA 141 03/12/2023   K 3.6 03/12/2023   CL 110 03/12/2023   CO2 24 03/12/2023    RADIOGRAPHIC STUDIES: I have personally reviewed the radiological reports and agreed with the findings in the report.  ASSESSMENT AND PLAN:  Malignant neoplasm of overlapping sites of left breast in female, estrogen receptor positive (HCC) This is a very pleasant 47 year old female patient, premenopausal with family history of breast cancer in mom who noticed breast abnormality in her left breast in December point for further investigation.  She had a mammogram, diagnostic mammogram as well as ultrasound which noticed an abnormality at the 12 o'clock position in the left breast measuring around 20 mm in its largest dimension along with a satellite nodule in the same breast at around 11 o'clock position measuring 4 x 3 x 6 mm without any enlarged left axillary lymphadenopathy.   Biopsy from these masses showed invasive mammary carcinoma, grade 2, prognostics ER 50% moderate staining, PR 95% strong staining, KI less than 5% HER2 negative on the larger mass and similar prognostics on the second mass.    Given the intermediate grade, strong ER/PR positivity, low proliferation index and HER2 negative tumor, we discussed about upfront surgery followed by consideration for Oncotype testing. However on the final pathology, it was thought that all her tumor was indeed 1 large mass measuring at least 5 cm, 2 out of 4 sentinel lymph nodes involved with tumor.  Final pathologic staging was PT3N1a.  Prognostics not repeated on the final  sample.  Reexcision showed tumor  measuring 2 mm.    She refused adjuvant chemotherapy.  She is now on goserelin with tamoxifen and abemaciclib.  We reduced the dose of verzenio   Total time spent:30 minutes  Thank you for consulting [place] in the care of this patient.  Please not hesitate to contact us with any additional questions or concerns. All questions were answered. The patient knows to call the clinic with any problems, questions or concerns.    Rachel Moulds, MD 04/19/23

## 2023-04-29 ENCOUNTER — Encounter: Payer: Self-pay | Admitting: Hematology and Oncology

## 2023-04-29 ENCOUNTER — Other Ambulatory Visit: Payer: Self-pay | Admitting: *Deleted

## 2023-04-29 MED ORDER — TAMOXIFEN CITRATE 20 MG PO TABS: 20.0000 mg | ORAL_TABLET | Freq: Every day | ORAL | 3 refills | Status: AC

## 2023-06-14 ENCOUNTER — Inpatient Hospital Stay: Payer: BC Managed Care – PPO

## 2023-06-14 ENCOUNTER — Inpatient Hospital Stay: Payer: BC Managed Care – PPO | Attending: Hematology and Oncology | Admitting: Pharmacist

## 2023-07-13 ENCOUNTER — Ambulatory Visit
Admission: RE | Admit: 2023-07-13 | Discharge: 2023-07-13 | Disposition: A | Discharge status: Home / Self Care | Payer: BC Managed Care – PPO | Source: Ambulatory Visit | Attending: Hematology and Oncology

## 2023-07-13 DIAGNOSIS — C50812 Malignant neoplasm of overlapping sites of left female breast: Secondary | ICD-10-CM

## 2023-07-13 DIAGNOSIS — Z853 Personal history of malignant neoplasm of breast: Secondary | ICD-10-CM | POA: Diagnosis not present

## 2023-09-14 ENCOUNTER — Encounter: Admitting: Primary Care

## 2023-09-24 ENCOUNTER — Ambulatory Visit (INDEPENDENT_AMBULATORY_CARE_PROVIDER_SITE_OTHER): Admitting: Primary Care

## 2023-09-24 ENCOUNTER — Encounter: Payer: Self-pay | Admitting: Primary Care

## 2023-09-24 VITALS — BP 126/78 | HR 71 | Temp 97.3°F | Wt 218.0 lb

## 2023-09-24 DIAGNOSIS — J4520 Mild intermittent asthma, uncomplicated: Secondary | ICD-10-CM | POA: Diagnosis not present

## 2023-09-24 DIAGNOSIS — Z1211 Encounter for screening for malignant neoplasm of colon: Secondary | ICD-10-CM

## 2023-09-24 DIAGNOSIS — R7303 Prediabetes: Secondary | ICD-10-CM

## 2023-09-24 DIAGNOSIS — Z23 Encounter for immunization: Secondary | ICD-10-CM

## 2023-09-24 DIAGNOSIS — E785 Hyperlipidemia, unspecified: Secondary | ICD-10-CM

## 2023-09-24 DIAGNOSIS — Z853 Personal history of malignant neoplasm of breast: Secondary | ICD-10-CM | POA: Diagnosis not present

## 2023-09-24 DIAGNOSIS — G8929 Other chronic pain: Secondary | ICD-10-CM

## 2023-09-24 DIAGNOSIS — R232 Flushing: Secondary | ICD-10-CM | POA: Insufficient documentation

## 2023-09-24 DIAGNOSIS — Z0000 Encounter for general adult medical examination without abnormal findings: Secondary | ICD-10-CM

## 2023-09-24 DIAGNOSIS — M5441 Lumbago with sciatica, right side: Secondary | ICD-10-CM

## 2023-09-24 LAB — LIPID PANEL
Cholesterol: 201 mg/dL — ABNORMAL HIGH (ref 0–200)
HDL: 34.5 mg/dL — ABNORMAL LOW (ref >39.00)
LDL Cholesterol: 148 mg/dL — ABNORMAL HIGH (ref 0–99)
NonHDL: 166.88
Total CHOL/HDL Ratio: 6
Triglycerides: 96 mg/dL (ref 0.0–149.0)
VLDL: 19.2 mg/dL (ref 0.0–40.0)

## 2023-09-24 LAB — HEMOGLOBIN A1C: Hgb A1c MFr Bld: 5.5 % (ref 4.6–6.5)

## 2023-09-24 MED ORDER — ALBUTEROL SULFATE HFA 108 (90 BASE) MCG/ACT IN AERS: 1.0000 | INHALATION_SPRAY | Freq: Four times a day (QID) | RESPIRATORY_TRACT | 0 refills | Status: AC | PRN

## 2023-09-24 NOTE — Assessment & Plan Note (Signed)
 Repeat A1C pending.

## 2023-09-24 NOTE — Assessment & Plan Note (Signed)
 Controlled.  Continue albuterol  inhaler PRN for which she uses sparingly.  Refills provided.

## 2023-09-24 NOTE — Assessment & Plan Note (Signed)
 Repeat lipid panel pending.

## 2023-09-24 NOTE — Assessment & Plan Note (Signed)
 Tetanus due, provided today. Mammogram UTD Colonoscopy due, referral placed to GI  Discussed the importance of a healthy diet and regular exercise in order for weight loss, and to reduce the risk of further co-morbidity.  Exam stable. Labs pending.  Follow up in 1 year for repeat physical.

## 2023-09-24 NOTE — Patient Instructions (Signed)
 Stop by the lab prior to leaving today. I will notify you of your results once received.   You will receive a phone call from GI regarding colonoscopy.  It was a pleasure to see you today!

## 2023-09-24 NOTE — Progress Notes (Signed)
 Subjective:    Patient ID: Nichole [name], female    DOB: 11-03-1975, 48 y.o.   MRN: [id number]  HPI  Nichole [name] is a very pleasant 48 y.o. female who presents today for complete physical and follow up of chronic conditions.  She has not been seen since June 2022.  Immunizations: -Tetanus: Completed > 10 years ago.   Diet: Fair diet.  Exercise: Regular exercise.  Eye exam: Completes annually  Dental exam: Completes semi-annually    Pap Smear: Hysterectomy  Mammogram: Completed in February 2025  Colonoscopy: Never completed  BP Readings from Last 3 Encounters:  09/24/23 126/78  04/19/23 (!) 141/71  04/13/23 122/82    Wt Readings from Last 3 Encounters:  09/24/23 218 lb (98.9 kg)  04/19/23 227 lb 11.2 oz (103.3 kg)  03/30/23 220 lb 3.2 oz (99.9 kg)      Review of Systems  Constitutional:  Negative for unexpected weight change.  HENT:  Negative for rhinorrhea.   Respiratory:  Negative for cough and shortness of breath.   Cardiovascular:  Negative for chest pain.  Gastrointestinal:  Negative for constipation and diarrhea.  Genitourinary:  Negative for difficulty urinating.  Musculoskeletal:  Negative for arthralgias and myalgias.  Skin:  Negative for rash.  Allergic/Immunologic: Negative for environmental allergies.  Neurological:  Negative for dizziness, numbness and headaches.  Psychiatric/Behavioral:  The patient is not nervous/anxious.          Past Medical History:  Diagnosis Date   Asthma, exercise induced    seasonal   Family history of breast cancer 08/14/2021   Family history of kidney cancer 08/14/2021   History of external beam radiation therapy    left breast cancer  11-03-2021 to  12-19-2021   History of ovarian cyst    Malignant neoplasm of overlapping sites of left breast in female, estrogen receptor positive (HCC) 07/2021   oncologist-- dr iruku/  general surgeon-- dr t. Afton Horse;  dx 08-11-2021,  Invasive mammary / lobular  carcinoma in situ overlapping w/ positive 2 SLN ,  ER/PR+, HER2 negative;  completed radiation 12-19-2021, refused chemo,  started antiestrogen 08/ 2023   Pre-diabetes    hx of   Wears glasses     Social History   Socioeconomic History   Marital status: Divorced    Spouse name: Not on file   Number of children: 1   Years of education: Not on file   Highest education level: Not on file  Occupational History   Not on file  Tobacco Use   Smoking status: Former    Types: Cigarettes    Start date: 2020    Quit date: 1996    Years since quitting: 29.3   Smokeless tobacco: Never   Tobacco comments:    03-25-2023  per pt quit smoking 2020, age teen (approx 56),  smoked for 24 yrs  Vaping Use   Vaping status: Former  Substance and Sexual Activity   Alcohol use: Not Currently    Comment: very rare   Drug use: Not Currently    Comment: not since age 58 (marijuana)   Sexual activity: Not Currently    Birth control/protection: Surgical    Comment: hysterectomy  Other Topics Concern   Not on file  Social History Narrative   Work as a Engineer, 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   Has 18 year old son.   Moved from [place]  in Sept. 2015   Social Drivers  of Health   Financial Resource Strain: Not on file  Food Insecurity: Not on file  Transportation Needs: Not on file  Physical Activity: Not on file  Stress: Not on file  Social Connections: Not on file  Intimate Partner Violence: Not on file    Past Surgical History:  Procedure Laterality Date   BIOPSY  03/04/2021   Procedure: BIOPSY;  Surgeon: Junie Olds, MD;  Location: [facility];  Service: General;;   BREAST LUMPECTOMY WITH RADIOACTIVE SEED AND SENTINEL LYMPH NODE BIOPSY Left 09/11/2021   Procedure: LEFT BREAST LUMPECTOMY WITH RADIOACTIVE SEED X3 AND LEFT SENTINEL LYMPH NODE BIOPSY;  Surgeon: Sim Dryer, MD;  Location: [facility];  Service: General;  Laterality: Left;    ESOPHAGOGASTRODUODENOSCOPY N/A 03/04/2021   Procedure: ESOPHAGOGASTRODUODENOSCOPY (EGD);  Surgeon: Junie Olds, MD;  Location: [facility];  Service: General;  Laterality: N/A;   LAPAROSCOPIC GASTRIC SLEEVE RESECTION N/A 05/02/2021   Procedure: LAPAROSCOPIC GASTRIC SLEEVE RESECTION;  Surgeon: Junie Olds, MD;  Location: [facility];  Service: General;  Laterality: N/A;   LAPAROSCOPIC VAGINAL HYSTERECTOMY WITH SALPINGECTOMY N/A 10/08/2016   Procedure: LAPAROSCOPIC ASSISTED VAGINAL HYSTERECTOMY WITH RIGHT SALPINGECTOMY LYSIS OF ADHESIONS;  Surgeon: Granville Layer, MD;  Location: [facility];  Service: Gynecology;  Laterality: N/A;   MYOMECTOMY ABDOMINAL APPROACH     2010-2011   RE-EXCISION OF BREAST LUMPECTOMY Left 09/24/2021   Procedure: RE-EXCISION OF LEFT BREAST LUMPECTOMY;  Surgeon: Sim Dryer, MD;  Location: [facility];  Service: General;  Laterality: Left;   RIGHT OOPHORECTOMY Right 2003   via laparotomy ,  per pt for tumor (cyst) on ovary,  told benign   ROBOTIC ASSISTED BILATERAL SALPINGO OOPHERECTOMY Bilateral 03/30/2023   Procedure: XI ROBOTIC ASSISTED RIGHT  OOPHORECTOMY;  Surgeon: Reinaldo Caras, MD;  Location: [facility];  Service: Gynecology;  Laterality: Bilateral;   ROBOTIC ASSISTED LAPAROSCOPIC LYSIS OF ADHESION N/A 03/30/2023   Procedure: XI ROBOTIC ASSISTED LAPAROSCOPIC LYSIS OF ADHESION;  Surgeon: Reinaldo Caras, MD;  Location: [facility];  Service: Gynecology;  Laterality: N/A;   UPPER GI ENDOSCOPY N/A 05/02/2021   Procedure: UPPER GI ENDOSCOPY;  Surgeon: Junie Olds, MD;  Location: [facility];  Service: General;  Laterality: N/A;   WISDOM TOOTH EXTRACTION      Family History  Problem Relation Age of Onset   Arthritis Mother    Breast cancer Mother 50       Lumpectomy   Hyperlipidemia Mother    Hypertension Mother    Kidney cancer Mother 49       Had ablation   Graves' disease Mother         Had thyroid  removed   Graves' disease Brother        Deceased    No Known Allergies  Current Outpatient Medications on File Prior to Visit  Medication Sig Dispense Refill   cetirizine  (ZYRTEC ) 10 MG tablet Take 1 tablet (10 mg total) by mouth at bedtime. For allergies (Patient taking differently: Take 10 mg by mouth as needed for allergies. For allergies) 90 tablet 0   Fezolinetant  (VEOZAH ) 45 MG TABS Take 1 tablet (45 mg total) by mouth daily. 90 tablet 3   fluticasone  (FLONASE ) 50 MCG/ACT nasal spray Place 1 spray into both nostrils 2 (two) times daily as needed for allergies or rhinitis. 48 mL 0   nystatin  (MYCOSTATIN /NYSTOP ) powder Apply 1 Application topically 3 (three) times daily. 15 g 0   tamoxifen  (NOLVADEX ) 20 MG  tablet Take 1 tablet (20 mg total) by mouth daily. 90 tablet 3   VERZENIO  100 MG tablet TAKE 1 TABLET BY MOUTH 2 TIMES A DAY 56 tablet 1   Vitamin D -Vitamin K (K2-D3 5000 PO) Take 1 capsule by mouth daily.     No current facility-administered medications on file prior to visit.    BP 126/78   Pulse 71   Temp (!) 97.3 F (36.3 C) (Temporal)   Wt 218 lb (98.9 kg)   LMP 09/09/2016 (Approximate)   SpO2 100%   BMI 35.73 kg/m  Objective:   Physical Exam HENT:     Right Ear: Tympanic membrane and ear canal normal.     Left Ear: Tympanic membrane and ear canal normal.  Eyes:     Pupils: Pupils are equal, round, and reactive to light.  Cardiovascular:     Rate and Rhythm: Normal rate and regular rhythm.  Pulmonary:     Effort: Pulmonary effort is normal.     Breath sounds: Normal breath sounds.  Abdominal:     General: Bowel sounds are normal.     Palpations: Abdomen is soft.     Tenderness: There is no abdominal tenderness.  Musculoskeletal:        General: Normal range of motion.     Cervical back: Neck supple.  Skin:    General: Skin is warm and dry.  Neurological:     Mental Status: She is alert and oriented to person, place, and time.     Cranial  Nerves: No cranial nerve deficit.     Deep Tendon Reflexes:     Reflex Scores:      Patellar reflexes are 2+ on the right side and 2+ on the left side. Psychiatric:        Mood and Affect: Mood normal.           Assessment & Plan:  Preventative health care Assessment & Plan: Tetanus due, provided today. Mammogram UTD Colonoscopy due, referral placed to GI  Discussed the importance of a healthy diet and regular exercise in order for weight loss, and to reduce the risk of further co-morbidity.  Exam stable. Labs pending.  Follow up in 1 year for repeat physical.    Mild intermittent asthma without complication Assessment & Plan: Controlled.  Continue albuterol  inhaler PRN for which she uses sparingly.  Refills provided.   Orders: -     Albuterol  Sulfate HFA; Inhale 1-2 puffs into the lungs every 6 (six) hours as needed for wheezing or shortness of breath.  Dispense: 1 each; Refill: 0  Hyperlipidemia, unspecified hyperlipidemia type Assessment & Plan: Repeat lipid panel pending.    History of breast cancer Assessment & Plan: Following with oncology, reviewed office notes from November 2024.  Continue Tamoxifen  20 mg daily. Mammogram reviewed from February 2025.   Prediabetes Assessment & Plan: Repeat A1C pending.   Orders: -     Lipid panel -     Hemoglobin A1c  Chronic right-sided low back pain with right-sided sciatica Assessment & Plan: Improved.  No concerns today.  Continue to monitor.    Hot flashes Assessment & Plan: Improving.  Continue Veozah  45 mg daily.    Screening for colon cancer -     Ambulatory referral to Gastroenterology        Gabriel John, NP

## 2023-09-24 NOTE — Assessment & Plan Note (Signed)
 Improving.  Continue Veozah  45 mg daily.

## 2023-09-24 NOTE — Assessment & Plan Note (Signed)
 Following with oncology, reviewed office notes from November 2024.  Continue Tamoxifen  20 mg daily. Mammogram reviewed from February 2025.

## 2023-09-24 NOTE — Assessment & Plan Note (Signed)
Improved!  No concerns today.   Continue to monitor.  

## 2023-09-27 ENCOUNTER — Encounter: Payer: Self-pay | Admitting: *Deleted

## 2023-10-12 ENCOUNTER — Encounter: Payer: Self-pay | Admitting: Internal Medicine

## 2023-10-15 ENCOUNTER — Ambulatory Visit (HOSPITAL_COMMUNITY)
Admission: RE | Admit: 2023-10-15 | Discharge: 2023-10-15 | Disposition: A | Discharge status: Home / Self Care | Payer: Self-pay | Source: Ambulatory Visit | Attending: Primary Care | Admitting: Primary Care

## 2023-10-15 DIAGNOSIS — E785 Hyperlipidemia, unspecified: Secondary | ICD-10-CM | POA: Insufficient documentation

## 2023-10-15 DIAGNOSIS — R7303 Prediabetes: Secondary | ICD-10-CM | POA: Insufficient documentation

## 2023-10-17 ENCOUNTER — Ambulatory Visit: Payer: Self-pay | Admitting: Primary Care

## 2023-10-21 ENCOUNTER — Other Ambulatory Visit: Payer: Self-pay | Admitting: Medical Genetics

## 2023-10-28 ENCOUNTER — Ambulatory Visit (AMBULATORY_SURGERY_CENTER)

## 2023-10-28 ENCOUNTER — Other Ambulatory Visit: Payer: Self-pay

## 2023-10-28 VITALS — Ht 65.25 in | Wt 212.0 lb

## 2023-10-28 DIAGNOSIS — Z1211 Encounter for screening for malignant neoplasm of colon: Secondary | ICD-10-CM

## 2023-10-28 MED ORDER — NA SULFATE-K SULFATE-MG SULF 17.5-3.13-1.6 GM/177ML PO SOLN: 1.0000 | Freq: Once | ORAL | 0 refills | Status: AC

## 2023-10-28 NOTE — Progress Notes (Signed)
 Denies allergies to eggs or soy products. Denies complication of anesthesia or sedation. Denies use of weight loss medication. Denies use of O2.   Emmi instructions given for colonoscopy.

## 2023-11-11 ENCOUNTER — Encounter: Payer: Self-pay | Admitting: Internal Medicine

## 2023-12-02 DIAGNOSIS — K635 Polyp of colon: Secondary | ICD-10-CM | POA: Diagnosis not present

## 2023-12-03 ENCOUNTER — Ambulatory Visit (AMBULATORY_SURGERY_CENTER): Admitting: Internal Medicine

## 2023-12-03 ENCOUNTER — Encounter: Payer: Self-pay | Admitting: Internal Medicine

## 2023-12-03 VITALS — BP 150/82 | HR 76 | Temp 98.0°F | Resp 13 | Ht 65.25 in | Wt 212.0 lb

## 2023-12-03 DIAGNOSIS — K635 Polyp of colon: Secondary | ICD-10-CM | POA: Diagnosis not present

## 2023-12-03 DIAGNOSIS — D122 Benign neoplasm of ascending colon: Secondary | ICD-10-CM | POA: Diagnosis not present

## 2023-12-03 DIAGNOSIS — D123 Benign neoplasm of transverse colon: Secondary | ICD-10-CM

## 2023-12-03 DIAGNOSIS — K648 Other hemorrhoids: Secondary | ICD-10-CM

## 2023-12-03 DIAGNOSIS — Z1211 Encounter for screening for malignant neoplasm of colon: Secondary | ICD-10-CM

## 2023-12-03 MED ORDER — SODIUM CHLORIDE 0.9 % IV SOLN: 500.0000 mL | Freq: Once | INTRAVENOUS | Status: DC

## 2023-12-03 MED ORDER — FLEET ENEMA RE ENEM
1.0000 | ENEMA | Freq: Once | RECTAL | Status: AC
  Administered 2023-12-03: 1 via RECTAL

## 2023-12-03 NOTE — Progress Notes (Signed)
 GASTROENTEROLOGY PROCEDURE H&P NOTE   Primary Care Physician: Gretta Comer POUR, NP    Reason for Procedure:   Colon cancer screening  Plan:    Colonoscopy  Patient is appropriate for endoscopic procedure(s) in the ambulatory ([facility]) setting.  The nature of the procedure, as well as the risks, benefits, and alternatives were carefully and thoroughly reviewed with the patient. Ample time for discussion and questions allowed. The patient understood, was satisfied, and agreed to proceed.     HPI: Nichole [name] is a 48 y.o. female who presents for colonoscopy for colon cancer screening. Denies blood in stools, changes in bowel habits, or unintentional weight loss. Denies family history of colon cancer.  Past Medical History:  Diagnosis Date   Allergy    Anemia    Arthritis    Asthma, exercise induced    seasonal   Blood transfusion without reported diagnosis    Cataract    Family history of breast cancer 08/14/2021   Family history of kidney cancer 08/14/2021   History of external beam radiation therapy    left breast cancer  11-03-2021 to  12-19-2021   History of ovarian cyst    Hyperlipidemia    Malignant neoplasm of overlapping sites of left breast in female, estrogen receptor positive (HCC) 07/2021   oncologist-- dr iruku/  general surgeon-- dr t. vanderbilt;  dx 08-11-2021,  Invasive mammary / lobular carcinoma in situ overlapping w/ positive 2 SLN ,  ER/PR+, HER2 negative;  completed radiation 12-19-2021, refused chemo,  started antiestrogen 08/ 2023   Pre-diabetes    hx of   Wears glasses     Past Surgical History:  Procedure Laterality Date   BIOPSY  03/04/2021   Procedure: BIOPSY;  Surgeon: Lyndel Deward PARAS, MD;  Location: [facility];  Service: General;;   BREAST LUMPECTOMY WITH RADIOACTIVE SEED AND SENTINEL LYMPH NODE BIOPSY Left 09/11/2021   Procedure: LEFT BREAST LUMPECTOMY WITH RADIOACTIVE SEED X3 AND LEFT SENTINEL LYMPH NODE BIOPSY;  Surgeon:  vanderbilt Ned, MD;  Location: [facility];  Service: General;  Laterality: Left;   ESOPHAGOGASTRODUODENOSCOPY N/A 03/04/2021   Procedure: ESOPHAGOGASTRODUODENOSCOPY (EGD);  Surgeon: Lyndel Deward PARAS, MD;  Location: [facility];  Service: General;  Laterality: N/A;   LAPAROSCOPIC GASTRIC SLEEVE RESECTION N/A 05/02/2021   Procedure: LAPAROSCOPIC GASTRIC SLEEVE RESECTION;  Surgeon: Lyndel Deward PARAS, MD;  Location: [facility];  Service: General;  Laterality: N/A;   LAPAROSCOPIC VAGINAL HYSTERECTOMY WITH SALPINGECTOMY N/A 10/08/2016   Procedure: LAPAROSCOPIC ASSISTED VAGINAL HYSTERECTOMY WITH RIGHT SALPINGECTOMY LYSIS OF ADHESIONS;  Surgeon: Fredirick Glenys RAMAN, MD;  Location: [facility];  Service: Gynecology;  Laterality: N/A;   MYOMECTOMY ABDOMINAL APPROACH     2010-2011   RE-EXCISION OF BREAST LUMPECTOMY Left 09/24/2021   Procedure: RE-EXCISION OF LEFT BREAST LUMPECTOMY;  Surgeon: vanderbilt Ned, MD;  Location: [facility];  Service: General;  Laterality: Left;   RIGHT OOPHORECTOMY Right 2003   via laparotomy ,  per pt for tumor (cyst) on ovary,  told benign   ROBOTIC ASSISTED BILATERAL SALPINGO OOPHERECTOMY Bilateral 03/30/2023   Procedure: XI ROBOTIC ASSISTED RIGHT  OOPHORECTOMY;  Surgeon: Glennon Almarie POUR, MD;  Location: [facility];  Service: Gynecology;  Laterality: Bilateral;   ROBOTIC ASSISTED LAPAROSCOPIC LYSIS OF ADHESION N/A 03/30/2023   Procedure: XI ROBOTIC ASSISTED LAPAROSCOPIC LYSIS OF ADHESION;  Surgeon: Glennon Almarie POUR, MD;  Location: [facility];  Service: Gynecology;  Laterality: N/A;   UPPER GI ENDOSCOPY N/A 05/02/2021  Procedure: UPPER GI ENDOSCOPY;  Surgeon: Lyndel Deward PARAS, MD;  Location: [facility];  Service: General;  Laterality: N/A;   WISDOM TOOTH EXTRACTION      Prior to Admission medications   Medication Sig Start Date End Date Taking? Authorizing Provider  cetirizine  (ZYRTEC ) 10 MG tablet Take 1 tablet (10 mg total) by  mouth at bedtime. For allergies Patient taking differently: Take 10 mg by mouth as needed for allergies. For allergies 07/11/21  Yes Gretta Comer POUR, NP  Fezolinetant  (VEOZAH ) 45 MG TABS Take 1 tablet (45 mg total) by mouth daily. 04/13/23  Yes Glennon Almarie POUR, MD  tamoxifen  (NOLVADEX ) 20 MG tablet Take 1 tablet (20 mg total) by mouth daily. 04/29/23  Yes Iruku, Praveena, MD  Vitamin D -Vitamin K (K2-D3 5000 PO) Take 1 capsule by mouth daily.   Yes [provider]  albuterol  (VENTOLIN  HFA) 108 (90 Base) MCG/ACT inhaler Inhale 1-2 puffs into the lungs every 6 (six) hours as needed for wheezing or shortness of breath. 09/24/23   Clark, Katherine K, NP  fluticasone  (FLONASE ) 50 MCG/ACT nasal spray Place 1 spray into both nostrils 2 (two) times daily as needed for allergies or rhinitis. 02/27/21   Gretta Comer POUR, NP    Current Outpatient Medications  Medication Sig Dispense Refill   cetirizine  (ZYRTEC ) 10 MG tablet Take 1 tablet (10 mg total) by mouth at bedtime. For allergies (Patient taking differently: Take 10 mg by mouth as needed for allergies. For allergies) 90 tablet 0   Fezolinetant  (VEOZAH ) 45 MG TABS Take 1 tablet (45 mg total) by mouth daily. 90 tablet 3   tamoxifen  (NOLVADEX ) 20 MG tablet Take 1 tablet (20 mg total) by mouth daily. 90 tablet 3   Vitamin D -Vitamin K (K2-D3 5000 PO) Take 1 capsule by mouth daily.     albuterol  (VENTOLIN  HFA) 108 (90 Base) MCG/ACT inhaler Inhale 1-2 puffs into the lungs every 6 (six) hours as needed for wheezing or shortness of breath. 1 each 0   fluticasone  (FLONASE ) 50 MCG/ACT nasal spray Place 1 spray into both nostrils 2 (two) times daily as needed for allergies or rhinitis. 48 mL 0   Current Facility-Administered Medications  Medication Dose Route Frequency Provider Last Rate Last Admin   0.9 %  sodium chloride  infusion  500 mL Intravenous Once Darla Mcdonald C, MD        Allergies as of 12/03/2023   (No Known Allergies)    Family  History  Problem Relation Age of Onset   Arthritis Mother    Breast cancer Mother 60       Lumpectomy   Hyperlipidemia Mother    Hypertension Mother    Kidney cancer Mother 25       Had ablation   Graves' disease Mother        Had thyroid  removed   Graves' disease Brother        Deceased   Colon cancer Neg Hx    Esophageal cancer Neg Hx    Stomach cancer Neg Hx    Rectal cancer Neg Hx     Social History   Socioeconomic History   Marital status: Divorced    Spouse name: Not on file   Number of children: 1   Years of education: Not on file   Highest education level: Not on file  Occupational History   Not on file  Tobacco Use   Smoking status: Former    Types: Cigarettes    Start date: 2020  Quit date: 49    Years since quitting: 29.5   Smokeless tobacco: Never   Tobacco comments:    03-25-2023  per pt quit smoking 2020, age teen (approx 8),  smoked for 24 yrs  Vaping Use   Vaping status: Former  Substance and Sexual Activity   Alcohol use: Not Currently    Comment: very rare   Drug use: Not Currently    Comment: not since age 86 (marijuana)   Sexual activity: Not Currently    Birth control/protection: Surgical    Comment: hysterectomy  Other Topics Concern   Not on file  Social History Narrative   Work as a Engineer, 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   Has 76 year old son.   Moved from   in Sept. 2015   Social Drivers of Corporate investment banker Strain: Not on file  Food Insecurity: Not on file  Transportation Needs: Not on file  Physical Activity: Not on file  Stress: Not on file  Social Connections: Not on file  Intimate Partner Violence: Not on file    Physical Exam: Vital signs in last 24 hours: BP (!) 145/82   Pulse 82   Temp 98 F (36.7 C)   Ht 5' 5.25 (1.657 m)   Wt 212 lb (96.2 kg)   LMP 09/09/2016 (Approximate)   SpO2 98%   BMI 35.01 kg/m  GEN: NAD EYE: Sclerae anicteric ENT: MMM CV:  Non-tachycardic Pulm: No increased work of breathing GI: Soft, NT/ND NEURO:  Alert & Oriented   Estefana Kidney, MD [facility]  12/03/2023 7:59 AM

## 2023-12-03 NOTE — Progress Notes (Signed)
 Pt's states no medical or surgical changes since previsit or office visit.

## 2023-12-03 NOTE — Progress Notes (Signed)
 Called to room to assist during endoscopic procedure.  Patient ID and intended procedure confirmed with present staff. Received instructions for my participation in the procedure from the performing physician.

## 2023-12-03 NOTE — Op Note (Signed)
 [facility] Patient Name: Nichole [name] Procedure Date: 12/03/2023 8:28 AM MRN: [id number] Endoscopist: Rosario Estefana Kidney , , [id number] Age: 48 Referring MD:  Date of Birth: [birth year] Gender: Female Account #: [id number] Procedure:                Colonoscopy Indications:              Screening for colorectal malignant neoplasm, This                            is the patient's first colonoscopy Medicines:                Monitored Anesthesia Care Procedure:                Pre-Anesthesia Assessment:                           - Prior to the procedure, a History and Physical                            was performed, and patient medications and                            allergies were reviewed. The patient's tolerance of                            previous anesthesia was also reviewed. The risks                            and benefits of the procedure and the sedation                            options and risks were discussed with the patient.                            All questions were answered, and informed consent                            was obtained. Prior Anticoagulants: The patient has                            taken no anticoagulant or antiplatelet agents. ASA                            Grade Assessment: II - A patient with mild systemic                            disease. After reviewing the risks and benefits,                            the patient was deemed in satisfactory condition to                            undergo the procedure.  After obtaining informed consent, the colonoscope                            was passed under direct vision. Throughout the                            procedure, the patient's blood pressure, pulse, and                            oxygen saturations were monitored continuously. The                            CF [id number] #[id number] was introduced through the anus                            and advanced to  the the terminal ileum. The                            colonoscopy was performed without difficulty. The                            patient tolerated the procedure well. The quality                            of the bowel preparation was adequate. The terminal                            ileum, ileocecal valve, appendiceal orifice, and                            rectum were photographed. Scope In: 8:37:14 AM Scope Out: 9:10:09 AM Scope Withdrawal Time: 0 hours 20 minutes 15 seconds  Total Procedure Duration: 0 hours 32 minutes 55 seconds  Findings:                 The terminal ileum appeared normal.                           Four sessile polyps were found in the transverse                            colon and ascending colon. The polyps were 3 to 5                            mm in size. These polyps were removed with a cold                            snare. Resection and retrieval were complete.                           Non-bleeding internal hemorrhoids were found during                            retroflexion. Complications:  No immediate complications. Estimated Blood Loss:     Estimated blood loss was minimal. Impression:               - The examined portion of the ileum was normal.                           - Four 3 to 5 mm polyps in the transverse colon and                            in the ascending colon, removed with a cold snare.                            Resected and retrieved.                           - Non-bleeding internal hemorrhoids. Recommendation:           - Discharge patient to home (with escort).                           - Await pathology results.                           - Patient will need a two-day prep for future                            colonoscopies.                           - The findings and recommendations were discussed                            with the patient. Dr Estefana Federico Rosario Estefana Federico,  12/03/2023 9:13:00 AM

## 2023-12-03 NOTE — Patient Instructions (Addendum)
-   4 polyps removed and sent to pathology - hemorrhoids - Await pathology results. - Patient will need a two-day prep for future    colonoscopies. - The findings and recommendations were discussed     with the patient.  YOU HAD AN ENDOSCOPIC PROCEDURE TODAY AT THE [facility]:   Refer to the procedure report that was given to you for any specific questions about what was found during the examination.  If the procedure report does not answer your questions, please call your gastroenterologist to clarify.  If you requested that your care partner not be given the details of your procedure findings, then the procedure report has been included in a sealed envelope for you to review at your convenience later.  YOU SHOULD EXPECT: Some feelings of bloating in the abdomen. Passage of more gas than usual.  Walking can help get rid of the air that was put into your GI tract during the procedure and reduce the bloating. If you had a lower endoscopy (such as a colonoscopy or flexible sigmoidoscopy) you may notice spotting of blood in your stool or on the toilet paper. If you underwent a bowel prep for your procedure, you may not have a normal bowel movement for a few days.  Please Note:  You might notice some irritation and congestion in your nose or some drainage.  This is from the oxygen used during your procedure.  There is no need for concern and it should clear up in a day or so.  SYMPTOMS TO REPORT IMMEDIATELY:  Following lower endoscopy (colonoscopy or flexible sigmoidoscopy):  Excessive amounts of blood in the stool  Significant tenderness or worsening of abdominal pains  Swelling of the abdomen that is new, acute  Fever of 100F or higher   For urgent or emergent issues, a gastroenterologist can be reached at any hour by calling [phone]. Do not use MyChart messaging for urgent concerns.    DIET:  We do recommend a small meal at first, but then you may proceed to your regular  diet.  Drink plenty of fluids but you should avoid alcoholic beverages for 24 hours.  ACTIVITY:  You should plan to take it easy for the rest of today and you should NOT DRIVE or use heavy machinery until tomorrow (because of the sedation medicines used during the test).    FOLLOW UP: Our staff will call the number listed on your records the next business day following your procedure.  We will call around 7:15- 8:00 am to check on you and address any questions or concerns that you may have regarding the information given to you following your procedure. If we do not reach you, we will leave a message.     If any biopsies were taken you will be contacted by phone or by letter within the next 1-3 weeks.  Please call us  at [phone] if you have not heard about the biopsies in 3 weeks.    SIGNATURES/CONFIDENTIALITY: You and/or your care partner have signed paperwork which will be entered into your electronic medical record.  These signatures attest to the fact that that the information above on your After Visit Summary has been reviewed and is understood.  Full responsibility of the confidentiality of this discharge information lies with you and/or your care-partner.

## 2023-12-03 NOTE — Progress Notes (Signed)
 Report given to PACU, vss

## 2023-12-06 ENCOUNTER — Telehealth: Payer: Self-pay

## 2023-12-06 NOTE — Telephone Encounter (Signed)
  Follow up Call-     12/03/2023    7:47 AM  Call back number  Post procedure Call Back phone  # [phone]  Permission to leave phone message Yes     Patient questions:  Do you have a fever, pain , or abdominal swelling? No. Pain Score  0 *  Have you tolerated food without any problems? Yes.    Have you been able to return to your normal activities? Yes.    Do you have any questions about your discharge instructions: Diet   No. Medications  No. Follow up visit  No.  Do you have questions or concerns about your Care? No.  Actions: * If pain score is 4 or above: No action needed, pain <4.

## 2023-12-08 ENCOUNTER — Ambulatory Visit: Payer: Self-pay | Admitting: Internal Medicine

## 2023-12-08 LAB — SURGICAL PATHOLOGY

## 2024-03-16 ENCOUNTER — Other Ambulatory Visit: Payer: Self-pay | Admitting: Medical Genetics

## 2024-03-16 DIAGNOSIS — Z006 Encounter for examination for normal comparison and control in clinical research program: Secondary | ICD-10-CM

## 2024-05-02 ENCOUNTER — Other Ambulatory Visit: Payer: Self-pay | Admitting: Obstetrics and Gynecology

## 2024-05-03 ENCOUNTER — Other Ambulatory Visit: Payer: Self-pay | Admitting: Primary Care

## 2024-05-03 DIAGNOSIS — R232 Flushing: Secondary | ICD-10-CM

## 2024-05-03 MED ORDER — VEOZAH 45 MG PO TABS: 1.0000 | ORAL_TABLET | Freq: Every day | ORAL | 0 refills | Status: DC

## 2024-05-04 ENCOUNTER — Telehealth: Payer: Self-pay

## 2024-05-04 ENCOUNTER — Other Ambulatory Visit (HOSPITAL_COMMUNITY): Payer: Self-pay

## 2024-05-04 NOTE — Telephone Encounter (Signed)
 Requires PA, can we send to pharmacy team? Patient was getting by OB/GYN previously

## 2024-05-04 NOTE — Telephone Encounter (Signed)
Please notify patient of this information

## 2024-05-04 NOTE — Telephone Encounter (Signed)
 Pharmacy Patient Advocate Encounter   Received notification from Onbase that prior authorization for Veozah  45 is required/requested.   Insurance verification completed.   The patient is insured through [facility].   Per test claim: Per test claim, medication is not covered due to plan/benefit exclusion, PA not submitted at this time

## 2024-05-05 NOTE — Telephone Encounter (Signed)
 Spoke with pt relaying message from Rx PA Team. Pt verbalizes understanding and will contact oncologist to see if it will go through with them prescribing med.

## 2024-05-07 MED ORDER — VEOZAH 45 MG PO TABS: 1.0000 | ORAL_TABLET | Freq: Every day | ORAL | 0 refills | Status: AC

## 2024-05-09 ENCOUNTER — Telehealth: Payer: Self-pay | Admitting: *Deleted

## 2024-05-09 NOTE — Telephone Encounter (Addendum)
 Opened in error

## 2024-05-09 NOTE — Telephone Encounter (Signed)
 Spoke with pt returning her call about Veozah  rx. She states her insurance told her that Mallie can sign the Medical Necessity form that came with PA denial letter. Says it will need to be faxed to [phone] with the notation: Memo Urgent.   I don't see a denial letter scanned into pt's chart. Will forward message to RX PA Team for advise.

## 2024-05-09 NOTE — Telephone Encounter (Unsigned)
 Copied from CRM #[id number]. Topic: Clinical - Medication Question >> May 09, 2024  1:32 PM Mercedes MATSU wrote: Reason for CRM: Patient called in stating that she wanted to speak with NP Gretta or her nurse in regards to the medication Fezolinetant  (VEOZAH ) 45 MG TABS that she was prescribed. Patient is requesting a call back, and can be reached at [phone].

## 2024-05-10 ENCOUNTER — Other Ambulatory Visit (HOSPITAL_COMMUNITY): Payer: Self-pay

## 2024-05-10 ENCOUNTER — Telehealth: Payer: Self-pay

## 2024-05-10 NOTE — Telephone Encounter (Signed)
 Noted (see other message[s] by clicking blue '[address] [place]' link).   Fyi to [place].

## 2024-05-10 NOTE — Telephone Encounter (Signed)
 Sorry, Vladimir sent me a message regarding this and I thought she was talking about something else. Moria, can you help us  find the medical necessity form? My apologies.

## 2024-05-10 NOTE — Telephone Encounter (Signed)
 S/w pt and she states she has her Veozah  prescribed by PCP. She states the manufacturer coupon has changed and she is no longer eligible for the coupon and insurance is not covering. She is asking for assistance. Pt has no showed last two scheduled appts. Pt was offered an MD f/u for 05/30/24 and accepted, but was advised in the interim to treat her hot flashes, she would need to contact her PCP and ask them for PA or P2P process. She verbalized thanks and understanding.

## 2024-05-10 NOTE — Telephone Encounter (Signed)
-----   Message from Nurse Nena POUR, RN sent at 05/09/2024  5:45 PM EST ----- Regarding: Follow up please Hi, Ms. Mehler left one of the last messages 05/09/24 and I did not return her call.  Would appreciate staff following up on 05/10/24.  Message: she needed some assistance with a medication and then said she was having issues with Veozah .  Quick review of chart/meds - Veozah  never prescribed by Dr. Loretha or [facility]. [facility]  submitted a PA for that med last week.  Her last appt here was November 2024 and was [facility] for next 2 appts - lab and w/John I.  Dr.Iruku last prescribed Verzenio  November 2024 and Tamoxifen  December 2024.  Please reach out to clarify what issues with medications she is having.  Thanks, Sandi

## 2024-05-15 ENCOUNTER — Other Ambulatory Visit (HOSPITAL_COMMUNITY): Payer: Self-pay

## 2024-05-15 NOTE — Telephone Encounter (Signed)
Letter posted to MyChart

## 2024-05-16 ENCOUNTER — Other Ambulatory Visit (HOSPITAL_COMMUNITY): Payer: Self-pay

## 2024-05-16 ENCOUNTER — Telehealth: Payer: Self-pay

## 2024-05-16 NOTE — Telephone Encounter (Signed)
 Pharmacy Patient Advocate Encounter   Received notification from Pt Calls Messages that prior authorization for Veozah  45 is required/requested.   Insurance verification completed.   The patient is insured through [facility].   Per test claim: PA required; PA submitted to above mentioned insurance via Latent Key/confirmation #/EOC [id number] Status is pending  Submitted letter of medical neccesity and used  ICD 10 R23.2

## 2024-05-22 ENCOUNTER — Other Ambulatory Visit (HOSPITAL_COMMUNITY): Payer: Self-pay

## 2024-05-22 NOTE — Telephone Encounter (Signed)
 Pharmacy Patient Advocate Encounter  Received notification from [facility] that Prior Authorization for Veozah  45 has been APPROVED from 05/16/24 to 05/16/25. Ran test claim, Copay is $280.00 for 30 day supply. This test claim was processed through [facility]- copay amounts may vary at other pharmacies due to pharmacy/plan contracts, or as the patient moves through the different stages of their insurance plan.   PA #/Case ID/Reference #: # [id number]

## 2024-05-23 NOTE — Telephone Encounter (Signed)
 Noted

## 2024-05-30 ENCOUNTER — Inpatient Hospital Stay: Attending: Hematology and Oncology | Admitting: Hematology and Oncology

## 2024-05-30 VITALS — BP 132/70 | HR 95 | Resp 17 | Wt 229.8 lb

## 2024-05-30 DIAGNOSIS — Z1721 Progesterone receptor positive status: Secondary | ICD-10-CM | POA: Insufficient documentation

## 2024-05-30 DIAGNOSIS — Z90710 Acquired absence of both cervix and uterus: Secondary | ICD-10-CM | POA: Diagnosis not present

## 2024-05-30 DIAGNOSIS — Z803 Family history of malignant neoplasm of breast: Secondary | ICD-10-CM | POA: Insufficient documentation

## 2024-05-30 DIAGNOSIS — Z170 Estrogen receptor positive status [ER+]: Secondary | ICD-10-CM | POA: Diagnosis not present

## 2024-05-30 DIAGNOSIS — Z1732 Human epidermal growth factor receptor 2 negative status: Secondary | ICD-10-CM | POA: Insufficient documentation

## 2024-05-30 DIAGNOSIS — Z8051 Family history of malignant neoplasm of kidney: Secondary | ICD-10-CM | POA: Diagnosis not present

## 2024-05-30 DIAGNOSIS — M25552 Pain in left hip: Secondary | ICD-10-CM | POA: Insufficient documentation

## 2024-05-30 DIAGNOSIS — Z79810 Long term (current) use of selective estrogen receptor modulators (SERMs): Secondary | ICD-10-CM | POA: Insufficient documentation

## 2024-05-30 DIAGNOSIS — C50812 Malignant neoplasm of overlapping sites of left female breast: Secondary | ICD-10-CM | POA: Insufficient documentation

## 2024-05-30 DIAGNOSIS — Z923 Personal history of irradiation: Secondary | ICD-10-CM | POA: Insufficient documentation

## 2024-05-30 DIAGNOSIS — Z87891 Personal history of nicotine dependence: Secondary | ICD-10-CM | POA: Diagnosis not present

## 2024-05-30 DIAGNOSIS — N951 Menopausal and female climacteric states: Secondary | ICD-10-CM | POA: Diagnosis not present

## 2024-05-30 LAB — CMP (CANCER CENTER ONLY)
ALT: 19 U/L (ref 0–44)
AST: 23 U/L (ref 15–41)
Albumin: 4.3 g/dL (ref 3.5–5.0)
Alkaline Phosphatase: 70 U/L (ref 38–126)
Anion gap: 7 (ref 5–15)
BUN: 17 mg/dL (ref 6–20)
CO2: 25 mmol/L (ref 22–32)
Calcium: 9.4 mg/dL (ref 8.9–10.3)
Chloride: 108 mmol/L (ref 98–111)
Creatinine: 1.01 mg/dL — ABNORMAL HIGH (ref 0.44–1.00)
GFR, Estimated: >60 mL/min
Glucose, Bld: 104 mg/dL — ABNORMAL HIGH (ref 70–99)
Potassium: 4.2 mmol/L (ref 3.5–5.1)
Sodium: 140 mmol/L (ref 135–145)
Total Bilirubin: 0.3 mg/dL (ref 0.0–1.2)
Total Protein: 7.6 g/dL (ref 6.5–8.1)

## 2024-05-30 LAB — CBC WITH DIFFERENTIAL/PLATELET
Abs Immature Granulocytes: 0.01 K/uL (ref 0.00–0.07)
Basophils Absolute: 0 K/uL (ref 0.0–0.1)
Basophils Relative: 1 %
Eosinophils Absolute: 0 K/uL (ref 0.0–0.5)
Eosinophils Relative: 1 %
HCT: 34.3 % — ABNORMAL LOW (ref 36.0–46.0)
Hemoglobin: 11.5 g/dL — ABNORMAL LOW (ref 12.0–15.0)
Immature Granulocytes: 0 %
Lymphocytes Relative: 43 %
Lymphs Abs: 1.3 K/uL (ref 0.7–4.0)
MCH: 30.6 pg (ref 26.0–34.0)
MCHC: 33.5 g/dL (ref 30.0–36.0)
MCV: 91.2 fL (ref 80.0–100.0)
Monocytes Absolute: 0.3 K/uL (ref 0.1–1.0)
Monocytes Relative: 10 %
Neutro Abs: 1.4 K/uL — ABNORMAL LOW (ref 1.7–7.7)
Neutrophils Relative %: 45 %
Platelets: 216 K/uL (ref 150–400)
RBC: 3.76 MIL/uL — ABNORMAL LOW (ref 3.87–5.11)
RDW: 12.2 % (ref 11.5–15.5)
WBC: 3.1 K/uL — ABNORMAL LOW (ref 4.0–10.5)
nRBC: 0 % (ref 0.0–0.2)

## 2024-05-30 NOTE — Progress Notes (Signed)
 [facility] CONSULT NOTE  Patient Care Team: Gretta Comer POUR, NP as PCP - General (Nurse Practitioner) Loretha Ash, MD as Consulting Physician (Hematology and Oncology) Dewey Rush, MD as Consulting Physician (Radiation Oncology) Vanderbilt Ned, MD as Consulting Physician (General Surgery) Ginette Shasta NOVAK, NP as Nurse Practitioner (Obstetrics and Gynecology)  CHIEF COMPLAINTS/PURPOSE OF CONSULTATION:  Newly diagnosed breast cancer  HISTORY OF PRESENTING ILLNESS:   Nichole [name] 49 y.o. female is here because of recent diagnosis of left breast IDC  SUMMARY OF ONCOLOGIC HISTORY: Oncology History  History of breast cancer  07/08/2021 Mammogram    Screening mammogram 07/08/2021 showed possible mass with distortion in the left breast. 07/21/2021 she had a left diagnostic mammogram which showed a suspicious left breast mass at 12 o'clock position suspicious satellite nodule left breast at 11 o'clock position Ultrasound same day showed a 16 x 20 x 12 mm irregular hypoechoic mass left breast at 12 o'clock position 4 cm from the nipple.  There is an adjacent satellite nodule within the left breast 11 o'clock position 3 cm from nipple measuring 4 x 3 x 6 mm.  No enlarged left axillary lymphadenopathy    07/30/2021 Pathology Results   Pathology from left breast mass 11:00, 3 cm from nipple showed invasive mammary carcinoma grade 2 prognostics ER 50% moderate staining, PR 95% strong staining, Ki-67 less than 5% and HER2 negative.  The second mass at 11:00 also showed similar prognostics and is grade 2.   08/11/2021 Initial Diagnosis   Malignant neoplasm of overlapping sites of left breast in female, estrogen receptor positive (HCC)   08/11/2021 Cancer Staging   Staging form: Breast, AJCC 8th Edition - Pathologic: Stage IB (pT2, pN1, cM0, G2, ER+, PR+, HER2-) - Signed by Loretha Ash, MD on 10/02/2021 Histologic grading system: 3 grade system   08/29/2021 Genetic  Testing   Negative hereditary cancer genetic testing: no pathogenic variants detected in [facility] CancerNext-Expanded +RNAinsight Panel.  Report date is 08/29/2021.   The CancerNext-Expanded gene panel offered by [facility] and includes sequencing, rearrangement, and RNA analysis for the following 77 genes: AIP, ALK, APC, ATM, AXIN2, BAP1, BARD1, BLM, BMPR1A, BRCA1, BRCA2, BRIP1, CDC73, CDH1, CDK4, CDKN1B, CDKN2A, CHEK2, CTNNA1, DICER1, FANCC, FH, FLCN, GALNT12, KIF1B, LZTR1, MAX, MEN1, MET, MLH1, MSH2, MSH3, MSH6, MUTYH, NBN, NF1, NF2, NTHL1, PALB2, PHOX2B, PMS2, POT1, PRKAR1A, PTCH1, PTEN, RAD51C, RAD51D, RB1, RECQL, RET, SDHA, SDHAF2, SDHB, SDHC, SDHD, SMAD4, SMARCA4, SMARCB1, SMARCE1, STK11, SUFU, TMEM127, TP53, TSC1, TSC2, VHL and XRCC2 (sequencing and deletion/duplication); EGFR, EGLN1, HOXB13, KIT, MITF, PDGFRA, POLD1, and POLE (sequencing only); EPCAM and GREM1 (deletion/duplication only).    09/11/2021 Surgery   Left breast lumpectomy showed grade 2 invasive lobular carcinoma, lobular carcinoma in situ showing ductal involvement, invasive tumor focally present in the lateral margin, additional margins very close.  Tumor size greater than 5 cm. Changes consistent with prior biopsies.  Fibrocystic changes including extensive stromal fibrosis and adenosis.  Left axillary lymph node also confirmed metastatic lobular carcinoma.  Final pathologic staging was [id number]   11/03/2021 - 12/19/2021 Radiation Therapy   Site Technique Total Dose (Gy) Dose per Fx (Gy) Completed Fx Beam Energies  Breast, Left: Breast_L 3D 50.4/50.4 1.8 28/28 6X  Breast, Left: Breast_L_SCLV 3D 50.4/50.4 1.8 28/28 6X, 10X  Breast, Left: Breast_L_Bst 3D 10/10 2 5/5 6X     12/2021 -  Anti-estrogen oral therapy   Tamoxifen  alone.    Discussed the use of AI scribe software for clinical note transcription  with the patient, who gave verbal consent to proceed.  History of Present Illness Nichole [name] is a 49 year old female  with estrogen receptor positive left breast cancer, status post-lumpectomy with nodal involvement, presenting for evaluation of persistent left hip pain.  She has persistent left hip pain, described as constant and present all the time, which began after her breast cancer diagnosis. The pain has limited her physical activity, including use of her walk pad for exercise. She did not experience this pain prior to her cancer diagnosis and has not had a bone scan previously, only a bone density test. She attributes the pain to possible arthritis but is concerned about other etiologies given her oncologic history.  She is currently maintained on tamoxifen  without adverse effects and previously received Zoladex  injections, which were discontinued following her total hysterectomy last year. She completed a course of Verzenio , with the last dose in June 2025, having started in August 2023. She did not receive chemotherapy. Surveillance mammography was performed in February 2025.  Following her total hysterectomy, she has experienced significant menopausal symptoms, including frequent hot flashes. These symptoms have persisted since her surgical menopause and are compounded by ongoing anti-estrogen therapy.  Rest of the pertinent 10 point ROS reviewed and negative  MEDICAL HISTORY:  Past Medical History:  Diagnosis Date   Allergy    Anemia    Arthritis    Asthma, exercise induced    seasonal   Blood transfusion without reported diagnosis    Cataract    Family history of breast cancer 08/14/2021   Family history of kidney cancer 08/14/2021   History of external beam radiation therapy    left breast cancer  11-03-2021 to  12-19-2021   History of ovarian cyst    Hyperlipidemia    Malignant neoplasm of overlapping sites of left breast in female, estrogen receptor positive (HCC) 07/2021   oncologist-- dr Quinton Voth/  general surgeon-- dr t. vanderbilt;  dx 08-11-2021,  Invasive mammary / lobular carcinoma in  situ overlapping w/ positive 2 SLN ,  ER/PR+, HER2 negative;  completed radiation 12-19-2021, refused chemo,  started antiestrogen 08/ 2023   Pre-diabetes    hx of   Wears glasses     SURGICAL HISTORY: Past Surgical History:  Procedure Laterality Date   BIOPSY  03/04/2021   Procedure: BIOPSY;  Surgeon: Lyndel Deward PARAS, MD;  Location: [facility];  Service: General;;   BREAST LUMPECTOMY WITH RADIOACTIVE SEED AND SENTINEL LYMPH NODE BIOPSY Left 09/11/2021   Procedure: LEFT BREAST LUMPECTOMY WITH RADIOACTIVE SEED X3 AND LEFT SENTINEL LYMPH NODE BIOPSY;  Surgeon: Vanderbilt Ned, MD;  Location: [facility];  Service: General;  Laterality: Left;   ESOPHAGOGASTRODUODENOSCOPY N/A 03/04/2021   Procedure: ESOPHAGOGASTRODUODENOSCOPY (EGD);  Surgeon: Lyndel Deward PARAS, MD;  Location: [facility];  Service: General;  Laterality: N/A;   LAPAROSCOPIC GASTRIC SLEEVE RESECTION N/A 05/02/2021   Procedure: LAPAROSCOPIC GASTRIC SLEEVE RESECTION;  Surgeon: Lyndel Deward PARAS, MD;  Location: [facility];  Service: General;  Laterality: N/A;   LAPAROSCOPIC VAGINAL HYSTERECTOMY WITH SALPINGECTOMY N/A 10/08/2016   Procedure: LAPAROSCOPIC ASSISTED VAGINAL HYSTERECTOMY WITH RIGHT SALPINGECTOMY LYSIS OF ADHESIONS;  Surgeon: Fredirick Glenys RAMAN, MD;  Location: [facility];  Service: Gynecology;  Laterality: N/A;   MYOMECTOMY ABDOMINAL APPROACH     2010-2011   RE-EXCISION OF BREAST LUMPECTOMY Left 09/24/2021   Procedure: RE-EXCISION OF LEFT BREAST LUMPECTOMY;  Surgeon: Vanderbilt Ned, MD;  Location: [facility];  Service: General;  Laterality: Left;   RIGHT OOPHORECTOMY Right  2003   via laparotomy ,  per pt for tumor (cyst) on ovary,  told benign   ROBOTIC ASSISTED BILATERAL SALPINGO OOPHERECTOMY Bilateral 03/30/2023   Procedure: XI ROBOTIC ASSISTED RIGHT  OOPHORECTOMY;  Surgeon: Glennon Almarie POUR, MD;  Location[facility];  Service: Gynecology;  Laterality: Bilateral;   ROBOTIC ASSISTED  LAPAROSCOPIC LYSIS OF ADHESION N/A 03/30/2023   Procedure: XI ROBOTIC ASSISTED LAPAROSCOPIC LYSIS OF ADHESION;  Surgeon: Glennon Almarie POUR, MD;  Location: [facility];  Service: Gynecology;  Laterality: N/A;   UPPER GI ENDOSCOPY N/A 05/02/2021   Procedure: UPPER GI ENDOSCOPY;  Surgeon: Lyndel Deward PARAS, MD;  Location: [facility];  Service: General;  Laterality: N/A;   WISDOM TOOTH EXTRACTION      SOCIAL HISTORY: Social History   Socioeconomic History   Marital status: Divorced    Spouse name: Not on file   Number of children: 1   Years of education: Not on file   Highest education level: Not on file  Occupational History   Not on file  Tobacco Use   Smoking status: Former    Types: Cigarettes    Start date: 2020    Quit date: 1996    Years since quitting: 30.0   Smokeless tobacco: Never   Tobacco comments:    03-25-2023  per pt quit smoking 2020, age teen (approx 43),  smoked for 24 yrs  Vaping Use   Vaping status: Former  Substance and Sexual Activity   Alcohol use: Not Currently    Comment: very rare   Drug use: Not Currently    Comment: not since age 36 (marijuana)   Sexual activity: Not Currently    Birth control/protection: Surgical    Comment: hysterectomy  Other Topics Concern   Not on file  Social History Narrative   Work as a engineer, 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   Has 74 year old son.   Moved from [place]  in Sept. 2015   Social Drivers of Health   Tobacco Use: Medium Risk (12/03/2023)   Patient History    Smoking Tobacco Use: Former    Smokeless Tobacco Use: Never    Passive Exposure: Not on Actuary Strain: Not on file  Food Insecurity: No Food Insecurity (05/30/2024)   Epic    Worried About Programme Researcher, Broadcasting/film/video in the Last Year: Never true    Ran Out of Food in the Last Year: Never true  Transportation Needs: No Transportation Needs (05/30/2024)   Epic    Lack of Transportation (Medical):  No    Lack of Transportation (Non-Medical): No  Physical Activity: Not on file  Stress: Not on file  Social Connections: Not on file  Intimate Partner Violence: Not At Risk (05/30/2024)   Epic    Fear of Current or Ex-Partner: No    Emotionally Abused: No    Physically Abused: No    Sexually Abused: No  Depression (PHQ2-9): Low Risk (05/30/2024)   Depression (PHQ2-9)    PHQ-2 Score: 0  Alcohol Screen: Not on file  Housing: Low Risk (05/30/2024)   Epic    Unable to Pay for Housing in the Last Year: No    Number of Times Moved in the Last Year: 0    Homeless in the Last Year: No  Utilities: Not At Risk (05/30/2024)   Epic    Threatened with loss of utilities: No  Health Literacy: Not on file  FAMILY HISTORY: Family History  Problem Relation Age of Onset   Arthritis Mother    Breast cancer Mother 51       Lumpectomy   Hyperlipidemia Mother    Hypertension Mother    Kidney cancer Mother 31       Had ablation   Graves' disease Mother        Had thyroid  removed   Graves' disease Brother        Deceased   Colon cancer Neg Hx    Esophageal cancer Neg Hx    Stomach cancer Neg Hx    Rectal cancer Neg Hx     ALLERGIES:  has no known allergies.  MEDICATIONS:  Current Outpatient Medications  Medication Sig Dispense Refill   albuterol  (VENTOLIN  HFA) 108 (90 Base) MCG/ACT inhaler Inhale 1-2 puffs into the lungs every 6 (six) hours as needed for wheezing or shortness of breath. 1 each 0   cetirizine  (ZYRTEC ) 10 MG tablet Take 1 tablet (10 mg total) by mouth at bedtime. For allergies (Patient taking differently: Take 10 mg by mouth as needed for allergies. For allergies) 90 tablet 0   Fezolinetant  (VEOZAH ) 45 MG TABS Take 1 tablet (45 mg total) by mouth daily. For hot flashes 90 tablet 0   fluticasone  (FLONASE ) 50 MCG/ACT nasal spray Place 1 spray into both nostrils 2 (two) times daily as needed for allergies or rhinitis. 48 mL 0   tamoxifen  (NOLVADEX ) 20 MG tablet Take 1 tablet (20  mg total) by mouth daily. 90 tablet 3   Vitamin D -Vitamin K (K2-D3 5000 PO) Take 1 capsule by mouth daily.     No current facility-administered medications for this visit.    PHYSICAL EXAMINATION: ECOG PERFORMANCE STATUS: 0 - Asymptomatic  Vitals:   05/30/24 1057  BP: 132/70  Pulse: 95  Resp: 17  SpO2: 99%      Filed Weights   05/30/24 1057  Weight: 229 lb 12.8 oz (104.2 kg)      BP 132/70 (Patient Position: Sitting)   Pulse 95   Resp 17   Wt 229 lb 12.8 oz (104.2 kg)   LMP 09/09/2016   SpO2 99%   BMI 37.95 kg/m   Physical Exam Constitutional:      Appearance: Normal appearance.     Comments: No acute distress  Chest:     Comments: Breast exam, post radiation changes, no palpable masses otherwise. Musculoskeletal:     Cervical back: Normal range of motion and neck supple. No rigidity.  Lymphadenopathy:     Cervical: No cervical adenopathy.  Neurological:     Mental Status: She is alert.    LABORATORY DATA:  I have reviewed the data as listed Lab Results  Component Value Date   WBC 2.6 (L) 04/19/2023   HGB 11.7 (L) 04/19/2023   HCT 34.2 (L) 04/19/2023   MCV 92.9 04/19/2023   PLT 229 04/19/2023   Lab Results  Component Value Date   NA 141 04/19/2023   K 3.7 04/19/2023   CL 108 04/19/2023   CO2 28 04/19/2023    RADIOGRAPHIC STUDIES: I have personally reviewed the radiological reports and agreed with the findings in the report.  ASSESSMENT AND PLAN:   Assessment and Plan Assessment & Plan Estrogen receptor positive left breast cancer, post-lumpectomy High recurrence risk due to young age and prior nodal involvement. Completed nearly two years of abemaciclib , currently on tamoxifen . Ovarian suppression discontinued post-hysterectomy. Surveillance includes circulating tumor DNA testing for early recurrence detection. -  Ordered Guardant Reveal blood test for circulating tumor DNA surveillance. - Recommended continued annual mammography, including  diagnostic mammograms as insurance allows through 2028. - Ordered routine blood work. - Scheduled follow-up in six months.  Menopausal symptoms after surgical menopause Vasomotor symptoms post-hysterectomy and anti-estrogen therapy.  Left hip pain under evaluation for possible metastasis or arthritis Persistent left hip pain with differential including metastasis versus arthritis or benign causes. Further evaluation needed to exclude metastasis. - Ordered bone scan to evaluate for possible metastatic disease or arthritic changes. - Instructed that radiology will contact her to schedule the bone scan.  Total time spent:30 minutes  Thank you for consulting us  in the care of this patient.  Please not hesitate to contact us  with any additional questions or concerns. All questions were answered. The patient knows to call the clinic with any problems, questions or concerns.    Amber Stalls, MD 05/30/2024

## 2024-05-30 NOTE — Progress Notes (Signed)
 Guardant reveal requisition completed and provided to lab. Pt to be drawn in office with 1st draw, then home phlebotomy subsequently.

## 2024-06-12 ENCOUNTER — Encounter (HOSPITAL_COMMUNITY)
Admission: RE | Admit: 2024-06-12 | Discharge: 2024-06-12 | Disposition: A | Discharge status: Home / Self Care | Source: Ambulatory Visit | Attending: Hematology and Oncology | Admitting: Hematology and Oncology

## 2024-06-12 DIAGNOSIS — Z170 Estrogen receptor positive status [ER+]: Secondary | ICD-10-CM

## 2024-06-12 MED ORDER — TECHNETIUM TC 99M MEDRONATE IV KIT
19.9000 | PACK | Freq: Once | INTRAVENOUS | Status: AC | PRN
  Administered 2024-06-12: 19.9 via INTRAVENOUS

## 2024-06-22 ENCOUNTER — Telehealth: Payer: Self-pay

## 2024-06-22 ENCOUNTER — Other Ambulatory Visit (HOSPITAL_COMMUNITY): Payer: Self-pay

## 2024-06-22 NOTE — Telephone Encounter (Signed)
 Pharmacy Patient Advocate Encounter   Received notification from [facility] KEY that prior authorization for Veozah  45 is required/requested.   Insurance verification completed.   The patient is insured through [facility].   Per test claim: Per test claim, medication is not covered due to plan/benefit exclusion, PA not submitted at this time

## 2024-06-23 ENCOUNTER — Other Ambulatory Visit (HOSPITAL_COMMUNITY): Payer: Self-pay

## 2024-06-30 ENCOUNTER — Encounter: Payer: Self-pay | Admitting: Hematology and Oncology

## 2024-06-30 LAB — GUARDANT REVEAL

## 2024-07-20 ENCOUNTER — Encounter

## 2024-08-17 ENCOUNTER — Encounter

## 2024-11-28 ENCOUNTER — Inpatient Hospital Stay: Admitting: Hematology and Oncology

## 2024-11-28 ENCOUNTER — Inpatient Hospital Stay
